# Patient Record
Sex: Female | Born: 1995 | Race: White | Hispanic: No | Marital: Married | Smoking: Former smoker
Health system: Southern US, Community
[De-identification: ages and names within clinical notes are randomized; demographics above are authoritative.]

## PROBLEM LIST (undated history)

## (undated) DIAGNOSIS — F32A Depression, unspecified: Secondary | ICD-10-CM

## (undated) DIAGNOSIS — D649 Anemia, unspecified: Secondary | ICD-10-CM

## (undated) DIAGNOSIS — F419 Anxiety disorder, unspecified: Secondary | ICD-10-CM

## (undated) HISTORY — DX: Anemia, unspecified: D64.9

## (undated) HISTORY — PX: NO PAST SURGERIES: SHX2092

## (undated) HISTORY — DX: Anxiety disorder, unspecified: F41.9

## (undated) HISTORY — DX: Depression, unspecified: F32.A

---

## 2020-06-24 ENCOUNTER — Telehealth: Payer: Self-pay

## 2020-06-24 NOTE — Telephone Encounter (Signed)
Pt called the asking what is safe to take for nausea and vomiting. Pt states she is not able to keep anything on her stomach. Pt asks is it safe to take Emetrol. Pt made aware that Emetrol is safe and she can also take Vitamin B6 100 mg and Unisom. Understanding was voiced. Jacquel Redditt l Nakhi Choi, CMA

## 2020-06-30 ENCOUNTER — Other Ambulatory Visit: Payer: Self-pay

## 2020-06-30 ENCOUNTER — Ambulatory Visit (INDEPENDENT_AMBULATORY_CARE_PROVIDER_SITE_OTHER): Payer: Medicaid Other | Admitting: Advanced Practice Midwife

## 2020-06-30 ENCOUNTER — Encounter: Payer: Self-pay | Admitting: Advanced Practice Midwife

## 2020-06-30 ENCOUNTER — Other Ambulatory Visit (HOSPITAL_COMMUNITY)
Admission: RE | Admit: 2020-06-30 | Discharge: 2020-06-30 | Disposition: A | Discharge status: Home / Self Care | Payer: Medicaid Other | Source: Ambulatory Visit | Attending: Advanced Practice Midwife | Admitting: Advanced Practice Midwife

## 2020-06-30 DIAGNOSIS — O99340 Other mental disorders complicating pregnancy, unspecified trimester: Secondary | ICD-10-CM

## 2020-06-30 DIAGNOSIS — F419 Anxiety disorder, unspecified: Secondary | ICD-10-CM | POA: Insufficient documentation

## 2020-06-30 DIAGNOSIS — Z87891 Personal history of nicotine dependence: Secondary | ICD-10-CM

## 2020-06-30 DIAGNOSIS — Z3A09 9 weeks gestation of pregnancy: Secondary | ICD-10-CM

## 2020-06-30 DIAGNOSIS — Z3480 Encounter for supervision of other normal pregnancy, unspecified trimester: Secondary | ICD-10-CM | POA: Diagnosis present

## 2020-06-30 DIAGNOSIS — F17210 Nicotine dependence, cigarettes, uncomplicated: Secondary | ICD-10-CM | POA: Insufficient documentation

## 2020-06-30 DIAGNOSIS — O99331 Smoking (tobacco) complicating pregnancy, first trimester: Secondary | ICD-10-CM

## 2020-06-30 DIAGNOSIS — O99341 Other mental disorders complicating pregnancy, first trimester: Secondary | ICD-10-CM

## 2020-06-30 HISTORY — DX: Personal history of nicotine dependence: Z87.891

## 2020-06-30 MED ORDER — HYDROXYZINE PAMOATE 25 MG PO CAPS
25.0000 mg | ORAL_CAPSULE | Freq: Three times a day (TID) | ORAL | 0 refills | Status: DC | PRN
Start: 2020-06-30 — End: 2021-02-24

## 2020-06-30 MED ORDER — ONDANSETRON HCL 4 MG PO TABS: 4.0000 mg | ORAL_TABLET | Freq: Three times a day (TID) | ORAL | 0 refills | Status: DC | PRN

## 2020-06-30 NOTE — Patient Instructions (Signed)
First Trimester of Pregnancy  The first trimester of pregnancy is from week 1 until the end of week 13 (months 1 through 3). During this time, your baby will begin to develop inside you. At 6-8 weeks, the eyes and face are formed, and the heartbeat can be seen on ultrasound. At the end of 12 weeks, all the baby's organs are formed. Prenatal care is all the medical care you receive before the birth of your baby. Make sure you get good prenatal care and follow all of your doctor's instructions. Follow these instructions at home: Medicines  Take over-the-counter and prescription medicines only as told by your doctor. Some medicines are safe and some medicines are not safe during pregnancy.  Take a prenatal vitamin that contains at least 600 micrograms (mcg) of folic acid.  If you have trouble pooping (constipation), take medicine that will make your stool soft (stool softener) if your doctor approves. Eating and drinking   Eat regular, healthy meals.  Your doctor will tell you the amount of weight gain that is right for you.  Avoid raw meat and uncooked cheese.  If you feel sick to your stomach (nauseous) or throw up (vomit): ? Eat 4 or 5 small meals a day instead of 3 large meals. ? Try eating a few soda crackers. ? Drink liquids between meals instead of during meals.  To prevent constipation: ? Eat foods that are high in fiber, like fresh fruits and vegetables, whole grains, and beans. ? Drink enough fluids to keep your pee (urine) clear or pale yellow. Activity  Exercise only as told by your doctor. Stop exercising if you have cramps or pain in your lower belly (abdomen) or low back.  Do not exercise if it is too hot, too humid, or if you are in a place of great height (high altitude).  Try to avoid standing for [place] periods of time. Move your legs often if you must stand in one place for a [place] time.  Avoid heavy lifting.  Wear low-heeled shoes. Sit and stand up  straight.  You can have sex unless your doctor tells you not to. Relieving pain and discomfort  Wear a good support bra if your breasts are sore.  Take warm water baths (sitz baths) to soothe pain or discomfort caused by hemorrhoids. Use hemorrhoid cream if your doctor says it is okay.  Rest with your legs raised if you have leg cramps or low back pain.  If you have puffy, bulging veins (varicose veins) in your legs: ? Wear support hose or compression stockings as told by your doctor. ? Raise (elevate) your feet for 15 minutes, 3-4 times a day. ? Limit salt in your food. Prenatal care  Schedule your prenatal visits by the twelfth week of pregnancy.  Write down your questions. Take them to your prenatal visits.  Keep all your prenatal visits as told by your doctor. This is important. Safety  Wear your seat belt at all times when driving.  Make a list of emergency phone numbers. The list should include numbers for family, friends, the hospital, and police and fire departments. General instructions  Ask your doctor for a referral to a local prenatal class. Begin classes no later than at the start of month 6 of your pregnancy.  Ask for help if you need counseling or if you need help with nutrition. Your doctor can give you advice or tell you where to go for help.  Do not use hot tubs, steam   rooms, or saunas.  Do not douche or use tampons or scented sanitary pads.  Do not cross your legs for [place] periods of time.  Avoid all herbs and alcohol. Avoid drugs that are not approved by your doctor.  Do not use any tobacco products, including cigarettes, chewing tobacco, and electronic cigarettes. If you need help quitting, ask your doctor. You may get counseling or other support to help you quit.  Avoid cat litter boxes and soil used by cats. These carry germs that can cause birth defects in the baby and can cause a loss of your baby (miscarriage) or stillbirth.  Visit your dentist.  At home, brush your teeth with a soft toothbrush. Be gentle when you floss. Contact a doctor if:  You are dizzy.  You have mild cramps or pressure in your lower belly.  You have a nagging pain in your belly area.  You continue to feel sick to your stomach, you throw up, or you have watery poop (diarrhea).  You have a bad smelling fluid coming from your vagina.  You have pain when you pee (urinate).  You have increased puffiness (swelling) in your face, hands, legs, or ankles. Get help right away if:  You have a fever.  You are leaking fluid from your vagina.  You have spotting or bleeding from your vagina.  You have very bad belly cramping or pain.  You gain or lose weight rapidly.  You throw up blood. It may look like coffee grounds.  You are around people who have German measles, fifth disease, or chickenpox.  You have a very bad headache.  You have shortness of breath.  You have any kind of trauma, such as from a fall or a car accident. Summary  The first trimester of pregnancy is from week 1 until the end of week 13 (months 1 through 3).  To take care of yourself and your unborn baby, you will need to eat healthy meals, take medicines only if your doctor tells you to do so, and do activities that are safe for you and your baby.  Keep all follow-up visits as told by your doctor. This is important as your doctor will have to ensure that your baby is healthy and growing well. This information is not intended to replace advice given to you by your health care provider. Make sure you discuss any questions you have with your health care provider. Document Revised: 04/04/2019 Document Reviewed: 12/20/2016 Elsevier Patient Education  2020 Elsevier Inc.  

## 2020-06-30 NOTE — Progress Notes (Signed)
Subjective:    Shannon Ruiz is a G2P0010 [redacted]w[redacted]d being seen today for her first obstetrical visit.  Her obstetrical history is significant for Anxiety disorder, hx Depression, Smoker. Patient does intend to breast feed. Pregnancy history fully reviewed.  Patient reports no complaints.except occasional nausea.  Has tired Zofran with good result. Risks reviewed, wants Rx for rare use PRN.  Wants med to help with anxiety (prefers something simple, declines SSRIs, etc)   Vitals:   06/30/20 0959 06/30/20 1002  BP: 109/67   Pulse: (!) 101   Weight: 196 lb 6.4 oz (89.1 kg)   Height:  5\' 3"  (1.6 m)    HISTORY: OB History  Gravida Para Term Preterm AB Living  2       1    SAB TAB Ectopic Multiple Live Births  1            # Outcome Date GA Lbr Len/2nd Weight Sex Delivery Anes PTL Lv  2 Current           1 SAB 2014           Past Medical History:  Diagnosis Date  . Anxiety   . Depression    History reviewed. No pertinent surgical history. Family History  Problem Relation Age of Onset  . Anxiety disorder Mother   . Hypertension Mother   . Heart Problems Mother   . Diabetes Father      Exam    Uterus:     Pelvic Exam:    Perineum: Normal Perineum   Vulva: Bartholin's, Urethra, Skene's normal   Vagina:  normal mucosa, normal discharge   pH: Pap done   Cervix: no cervical motion tenderness   Adnexa: no mass, fullness, tenderness   Bony Pelvis: gynecoid  System: Breast:  normal appearance, no masses or tenderness   Skin: normal coloration and turgor, no rashes    Neurologic: oriented, grossly non-focal   Extremities: normal strength, tone, and muscle mass   HEENT neck supple with midline trachea   Mouth/Teeth mucous membranes moist, pharynx normal without lesions   Neck supple and no masses   Cardiovascular: regular rate and rhythm   Respiratory:  appears well, vitals normal, no respiratory distress, acyanotic, normal RR, ear and throat exam is normal, neck free of mass  or lymphadenopathy, chest clear, no wheezing, crepitations, rhonchi, normal symmetric air entry   Abdomen: soft, non-tender; bowel sounds normal; no masses,  no organomegaly   Urinary: urethral meatus normal      Assessment:    Pregnancy: G2P0010 Patient Active Problem List   Diagnosis Date Noted  . Supervision of other normal pregnancy, antepartum 06/30/2020  . Cigarette smoker 06/30/2020  . Anxiety disorder affecting pregnancy, antepartum 06/30/2020        Plan:    Rx Vistaril for anxiety, may cut in half Rx Zofran for nausea, occasional use. Reviewed risks.  Initial labs drawn. Prenatal vitamins.   Follow up in 4 weeks. 50% of 30 min visit spent on counseling and coordination of care.   Discussed and offered genetic screening options, including Quad screen/AFP, NIPS testing, and option to decline testing. Benefits/risks/alternatives reviewed. Pt aware that anatomy 08/31/2020 is form of genetic screening with lower accuracy in detecting trisomies than blood work.  Pt chooses genetic screening today. NIPS: ordered. Ultrasound discussed; fetal anatomic survey: ordered. Problem list reviewed and updated. The nature of [facility] with multiple MDs and other Advanced Practice Providers was explained to patient;  also emphasized that residents, students are part of our team. Routine obstetric precautions reviewed.    Wynelle Bourgeois 06/30/2020

## 2020-07-01 LAB — CYTOLOGY - PAP: Diagnosis: NEGATIVE

## 2020-07-02 ENCOUNTER — Telehealth: Payer: Self-pay

## 2020-07-02 LAB — CULTURE, OB URINE

## 2020-07-02 LAB — URINE CULTURE, OB REFLEX

## 2020-07-02 NOTE — Telephone Encounter (Signed)
Called patient to discuss most recent pap results. No answer or voice mail to leave a message. Patient pap shown some BV and she will need to be treated for this.

## 2020-07-07 ENCOUNTER — Ambulatory Visit (INDEPENDENT_AMBULATORY_CARE_PROVIDER_SITE_OTHER): Payer: Medicaid Other

## 2020-07-07 ENCOUNTER — Other Ambulatory Visit: Payer: Self-pay

## 2020-07-07 DIAGNOSIS — Z3A10 10 weeks gestation of pregnancy: Secondary | ICD-10-CM

## 2020-07-07 DIAGNOSIS — Z3481 Encounter for supervision of other normal pregnancy, first trimester: Secondary | ICD-10-CM

## 2020-07-07 DIAGNOSIS — Z3480 Encounter for supervision of other normal pregnancy, unspecified trimester: Secondary | ICD-10-CM

## 2020-07-07 LAB — CBC/D/PLT+RPR+RH+ABO+RUB AB...
Antibody Screen: NEGATIVE
Basophils Absolute: 0.1 10*3/uL (ref 0.0–0.2)
Basos: 1 %
EOS (ABSOLUTE): 0.3 10*3/uL (ref 0.0–0.4)
Eos: 2 %
HCV Ab: <0.1 s/co ratio (ref 0.0–0.9)
HIV Screen 4th Generation wRfx: NONREACTIVE
Hematocrit: 37.2 % (ref 34.0–46.6)
Hemoglobin: 12.1 g/dL (ref 11.1–15.9)
Hepatitis B Surface Ag: NEGATIVE
Immature Grans (Abs): 0.1 10*3/uL (ref 0.0–0.1)
Immature Granulocytes: 1 %
Lymphocytes Absolute: 3.1 10*3/uL (ref 0.7–3.1)
Lymphs: 19 %
MCH: 28.7 pg (ref 26.6–33.0)
MCHC: 32.5 g/dL (ref 31.5–35.7)
MCV: 88 fL (ref 79–97)
Monocytes Absolute: 1.5 10*3/uL — ABNORMAL HIGH (ref 0.1–0.9)
Monocytes: 9 %
Neutrophils Absolute: 11.6 10*3/uL — ABNORMAL HIGH (ref 1.4–7.0)
Neutrophils: 68 %
Platelets: 595 10*3/uL — ABNORMAL HIGH (ref 150–450)
RBC: 4.21 x10E6/uL (ref 3.77–5.28)
RDW: 13.1 % (ref 11.7–15.4)
RPR Ser Ql: NONREACTIVE
Rh Factor: POSITIVE
Rubella Antibodies, IGG: <0.9 index — ABNORMAL LOW (ref >0.99)
WBC: 16.7 10*3/uL — ABNORMAL HIGH (ref 3.4–10.8)

## 2020-07-07 LAB — SMN1 COPY NUMBER ANALYSIS (SMA CARRIER SCREENING)

## 2020-07-07 LAB — HEMOGLOBIN A1C
Est. average glucose Bld gHb Est-mCnc: 105 mg/dL
Hgb A1c MFr Bld: 5.3 % (ref 4.8–5.6)

## 2020-07-07 LAB — CYSTIC FIBROSIS GENE TEST

## 2020-07-07 LAB — HCV INTERPRETATION

## 2020-07-07 NOTE — Progress Notes (Signed)
Patient presents for scan for viability due to not being able to have it done at last weeks visit.   DATING AND VIABILITY SONOGRAM   Shannon Ruiz is a 24 y.o. year old G2P0010 with LMP Patient's last menstrual period was 04/24/2020 (exact date). which would correlate to  [redacted]w[redacted]d weeks gestation.  She has regular menstrual cycles.   She is here today for a confirmatory initial sonogram.    GESTATION: SINGLETON     FETAL ACTIVITY:          Heart rate         171 bpm          The fetus is active.     GESTATIONAL AGE AND  BIOMETRICS:  Gestational criteria: Estimated Date of Delivery: 01/29/21 by LMP now at [redacted]w[redacted]d  Previous Scans:0      CROWN RUMP LENGTH           3.12 cm         10.0 weeks                                                                               AVERAGE EGA(BY THIS SCAN):  10.0 weeks  WORKING EDD( LMP ):  01-29-2021     TECHNICIAN COMMENTS: Patient informed that the ultrasound is considered a limited obstetric ultrasound and is not intended to be a complete ultrasound exam. Patient also informed that the ultrasound is not being completed with the intent of assessing for fetal or placental anomalies or any pelvic abnormalities. Explained that the purpose of today's ultrasound is to assess for fetal heart rate. Patient acknowledges the purpose of the exam and the limitations of the study.      [name] 07/07/2020 3:13 PM

## 2020-07-16 ENCOUNTER — Telehealth: Payer: Medicaid Other | Admitting: Nurse Practitioner

## 2020-07-16 DIAGNOSIS — N3000 Acute cystitis without hematuria: Secondary | ICD-10-CM | POA: Diagnosis not present

## 2020-07-16 MED ORDER — NITROFURANTOIN MONOHYD MACRO 100 MG PO CAPS
100.0000 mg | ORAL_CAPSULE | Freq: Two times a day (BID) | ORAL | 0 refills | Status: DC
Start: 2020-07-16 — End: 2020-12-03

## 2020-07-16 NOTE — Progress Notes (Signed)

## 2020-07-28 ENCOUNTER — Ambulatory Visit (INDEPENDENT_AMBULATORY_CARE_PROVIDER_SITE_OTHER): Payer: Medicaid Other | Admitting: Advanced Practice Midwife

## 2020-07-28 ENCOUNTER — Other Ambulatory Visit: Payer: Self-pay

## 2020-07-28 VITALS — BP 104/69 | HR 98 | Wt 197.0 lb

## 2020-07-28 DIAGNOSIS — Z3480 Encounter for supervision of other normal pregnancy, unspecified trimester: Secondary | ICD-10-CM

## 2020-07-28 NOTE — Progress Notes (Signed)
   PRENATAL VISIT NOTE  Subjective:  Shannon Ruiz is a 24 y.o. G2P0010 at [redacted]w[redacted]d being seen today for ongoing prenatal care.  She is currently monitored for the following issues for this low-risk pregnancy and has Supervision of other normal pregnancy, antepartum; Cigarette smoker; and Anxiety disorder affecting pregnancy, antepartum on their problem list.  Patient reports no complaints.  Contractions: Not present. Vag. Bleeding: None.  Movement: Absent. Denies leaking of fluid.   The following portions of the patient's history were reviewed and updated as appropriate: allergies, current medications, past family history, past medical history, past social history, past surgical history and problem list.   Objective:   Vitals:   07/28/20 0955  BP: 104/69  Pulse: 98  Weight: 197 lb (89.4 kg)    Fetal Status:     Movement: Absent     General:  Alert, oriented and cooperative. Patient is in no acute distress.  Skin: Skin is warm and dry. No rash noted.   Cardiovascular: Normal heart rate noted  Respiratory: Normal respiratory effort, no problems with respiration noted  Abdomen: Soft, gravid, appropriate for gestational age.  Pain/Pressure: Present     Pelvic: Cervical exam deferred        Extremities: Normal range of motion.  Edema: None  Mental Status: Normal mood and affect. Normal behavior. Normal judgment and thought content.   Assessment and Plan:  Pregnancy: G2P0010 at [redacted]w[redacted]d 1. Supervision of other normal pregnancy, antepartum       - Genetic Screening  Preterm labor symptoms and general obstetric precautions including but not limited to vaginal bleeding, contractions, leaking of fluid and fetal movement were reviewed in detail with the patient. Please refer to After Visit Summary for other counseling recommendations.     Future Appointments  Date Time Provider Department Center  08/27/2020 10:00 AM Levie Heritage, DO [facility] None  09/04/2020  9:45 AM [facility] US4 [facility]  [facility]    Wynelle Bourgeois, CNM

## 2020-08-03 MED ORDER — ONDANSETRON HCL 4 MG PO TABS: 4.0000 mg | ORAL_TABLET | Freq: Three times a day (TID) | ORAL | 3 refills | Status: DC | PRN

## 2020-08-27 ENCOUNTER — Other Ambulatory Visit: Payer: Self-pay

## 2020-08-27 ENCOUNTER — Ambulatory Visit (INDEPENDENT_AMBULATORY_CARE_PROVIDER_SITE_OTHER): Payer: Medicaid Other | Admitting: Family Medicine

## 2020-08-27 VITALS — BP 121/72 | HR 104 | Wt 197.0 lb

## 2020-08-27 DIAGNOSIS — Z3480 Encounter for supervision of other normal pregnancy, unspecified trimester: Secondary | ICD-10-CM

## 2020-08-27 DIAGNOSIS — F17210 Nicotine dependence, cigarettes, uncomplicated: Secondary | ICD-10-CM

## 2020-08-27 DIAGNOSIS — F419 Anxiety disorder, unspecified: Secondary | ICD-10-CM

## 2020-08-27 DIAGNOSIS — O99340 Other mental disorders complicating pregnancy, unspecified trimester: Secondary | ICD-10-CM

## 2020-08-27 NOTE — Progress Notes (Signed)
Pt. Presents for ROB, [redacted]w[redacted]d. Denies any questions or concerns at this time.

## 2020-08-27 NOTE — Progress Notes (Signed)
   PRENATAL VISIT NOTE  Subjective:  Shannon Ruiz is a 24 y.o. G2P0010 at [redacted]w[redacted]d being seen today for ongoing prenatal care.  She is currently monitored for the following issues for this low-risk pregnancy and has Supervision of other normal pregnancy, antepartum; Cigarette smoker; and Anxiety disorder affecting pregnancy, antepartum on their problem list.  Patient reports no complaints.  Contractions: Not present. Vag. Bleeding: None.  Movement: Present. Denies leaking of fluid.   The following portions of the patient's history were reviewed and updated as appropriate: allergies, current medications, past family history, past medical history, past social history, past surgical history and problem list.   Objective:   Vitals:   08/27/20 1002  BP: 121/72  Pulse: (!) 104  Weight: 197 lb (89.4 kg)    Fetal Status: Fetal Heart Rate (bpm): 140   Movement: Present     General:  Alert, oriented and cooperative. Patient is in no acute distress.  Skin: Skin is warm and dry. No rash noted.   Cardiovascular: Normal heart rate noted  Respiratory: Normal respiratory effort, no problems with respiration noted  Abdomen: Soft, gravid, appropriate for gestational age.  Pain/Pressure: Absent     Pelvic: Cervical exam deferred        Extremities: Normal range of motion.  Edema: None  Mental Status: Normal mood and affect. Normal behavior. Normal judgment and thought content.   Assessment and Plan:  Pregnancy: G2P0010 at [redacted]w[redacted]d 1. Supervision of other normal pregnancy, antepartum FHT and FH normal  2. Cigarette smoker  3. Anxiety disorder affecting pregnancy, antepartum   Preterm labor symptoms and general obstetric precautions including but not limited to vaginal bleeding, contractions, leaking of fluid and fetal movement were reviewed in detail with the patient. Please refer to After Visit Summary for other counseling recommendations.   Return in about 4 weeks (around 09/24/2020) for OB f/u, In  Office.  Future Appointments  Date Time Provider Department Center  09/04/2020  9:45 AM [facility] US4 [facility]    Levie Heritage, DO

## 2020-09-04 ENCOUNTER — Other Ambulatory Visit: Payer: Self-pay

## 2020-09-04 ENCOUNTER — Other Ambulatory Visit: Payer: Self-pay | Admitting: *Deleted

## 2020-09-04 ENCOUNTER — Ambulatory Visit: Payer: Medicaid Other | Attending: Advanced Practice Midwife

## 2020-09-04 ENCOUNTER — Other Ambulatory Visit: Payer: Self-pay | Admitting: Advanced Practice Midwife

## 2020-09-04 DIAGNOSIS — O99212 Obesity complicating pregnancy, second trimester: Secondary | ICD-10-CM | POA: Diagnosis not present

## 2020-09-04 DIAGNOSIS — O99332 Smoking (tobacco) complicating pregnancy, second trimester: Secondary | ICD-10-CM

## 2020-09-04 DIAGNOSIS — E669 Obesity, unspecified: Secondary | ICD-10-CM

## 2020-09-04 DIAGNOSIS — F17200 Nicotine dependence, unspecified, uncomplicated: Secondary | ICD-10-CM

## 2020-09-04 DIAGNOSIS — Z3A19 19 weeks gestation of pregnancy: Secondary | ICD-10-CM

## 2020-09-04 DIAGNOSIS — Z362 Encounter for other antenatal screening follow-up: Secondary | ICD-10-CM

## 2020-09-04 DIAGNOSIS — Z3480 Encounter for supervision of other normal pregnancy, unspecified trimester: Secondary | ICD-10-CM | POA: Diagnosis present

## 2020-09-24 ENCOUNTER — Ambulatory Visit (INDEPENDENT_AMBULATORY_CARE_PROVIDER_SITE_OTHER): Payer: Medicaid Other | Admitting: Family Medicine

## 2020-09-24 ENCOUNTER — Other Ambulatory Visit: Payer: Self-pay

## 2020-09-24 VITALS — BP 102/66 | HR 113 | Wt 196.0 lb

## 2020-09-24 DIAGNOSIS — O99340 Other mental disorders complicating pregnancy, unspecified trimester: Secondary | ICD-10-CM

## 2020-09-24 DIAGNOSIS — Z3480 Encounter for supervision of other normal pregnancy, unspecified trimester: Secondary | ICD-10-CM

## 2020-09-24 DIAGNOSIS — O210 Mild hyperemesis gravidarum: Secondary | ICD-10-CM

## 2020-09-24 DIAGNOSIS — F419 Anxiety disorder, unspecified: Secondary | ICD-10-CM

## 2020-09-24 MED ORDER — NATACHEW 28-1 MG PO CHEW: 1.0000 | CHEWABLE_TABLET | Freq: Every day | ORAL | 12 refills | Status: DC

## 2020-09-24 NOTE — Progress Notes (Signed)
   PRENATAL VISIT NOTE  Subjective:  Shannon Ruiz is a 24 y.o. G2P0010 at [redacted]w[redacted]d being seen today for ongoing prenatal care.  She is currently monitored for the following issues for this low-risk pregnancy and has Supervision of other normal pregnancy, antepartum; Cigarette smoker; and Anxiety disorder affecting pregnancy, antepartum on their problem list.  Patient reports continued nausea and vomiting. Has foods that she is able to eat and keep down. Takes Zofran 3-4 times a week..  Contractions: Not present. Vag. Bleeding: None.  Movement: Present. Denies leaking of fluid.   The following portions of the patient's history were reviewed and updated as appropriate: allergies, current medications, past family history, past medical history, past social history, past surgical history and problem list.   Objective:   Vitals:   09/24/20 0953  BP: 102/66  Pulse: (!) 113  Weight: 196 lb (88.9 kg)    Fetal Status: Fetal Heart Rate (bpm): 148 Fundal Height: 21 cm Movement: Present     General:  Alert, oriented and cooperative. Patient is in no acute distress.  Skin: Skin is warm and dry. No rash noted.   Cardiovascular: Normal heart rate noted  Respiratory: Normal respiratory effort, no problems with respiration noted  Abdomen: Soft, gravid, appropriate for gestational age.  Pain/Pressure: Absent     Pelvic: Cervical exam deferred        Extremities: Normal range of motion.  Edema: None  Mental Status: Normal mood and affect. Normal behavior. Normal judgment and thought content.   Assessment and Plan:  Pregnancy: G2P0010 at [redacted]w[redacted]d 1. Supervision of other normal pregnancy, antepartum FHT and FH normal. [place] incomplete, but normal.  2. Anxiety disorder affecting pregnancy, antepartum  3. Hyperemesis gravidarum Continue Zofran PRN.  Preterm labor symptoms and general obstetric precautions including but not limited to vaginal bleeding, contractions, leaking of fluid and fetal movement were  reviewed in detail with the patient. Please refer to After Visit Summary for other counseling recommendations.   Return in about 4 weeks (around 10/22/2020) for 2 hr GTT.  Future Appointments  Date Time Provider Department Center  10/07/2020  2:00 PM [facility] US1 [facility]  10/22/2020  8:45 AM Levie Heritage, DO [facility] None    Levie Heritage, DO

## 2020-10-07 ENCOUNTER — Ambulatory Visit: Payer: Medicaid Other | Attending: Obstetrics and Gynecology

## 2020-10-07 ENCOUNTER — Other Ambulatory Visit: Payer: Self-pay

## 2020-10-07 DIAGNOSIS — E669 Obesity, unspecified: Secondary | ICD-10-CM

## 2020-10-07 DIAGNOSIS — Z720 Tobacco use: Secondary | ICD-10-CM

## 2020-10-07 DIAGNOSIS — O99332 Smoking (tobacco) complicating pregnancy, second trimester: Secondary | ICD-10-CM

## 2020-10-07 DIAGNOSIS — O365920 Maternal care for other known or suspected poor fetal growth, second trimester, not applicable or unspecified: Secondary | ICD-10-CM

## 2020-10-07 DIAGNOSIS — Z3A23 23 weeks gestation of pregnancy: Secondary | ICD-10-CM

## 2020-10-07 DIAGNOSIS — O99212 Obesity complicating pregnancy, second trimester: Secondary | ICD-10-CM | POA: Diagnosis not present

## 2020-10-07 DIAGNOSIS — Z362 Encounter for other antenatal screening follow-up: Secondary | ICD-10-CM | POA: Diagnosis present

## 2020-10-07 DIAGNOSIS — O322XX0 Maternal care for transverse and oblique lie, not applicable or unspecified: Secondary | ICD-10-CM | POA: Diagnosis not present

## 2020-10-08 ENCOUNTER — Other Ambulatory Visit: Payer: Self-pay | Admitting: *Deleted

## 2020-10-08 DIAGNOSIS — O365990 Maternal care for other known or suspected poor fetal growth, unspecified trimester, not applicable or unspecified: Secondary | ICD-10-CM

## 2020-10-13 NOTE — Telephone Encounter (Signed)
Called patient - having intermittent headaches. Feels a little congested and possibly has allergies. No headache currently. Will have her take claritin for a few days. Has appt on Thursday.

## 2020-10-20 ENCOUNTER — Encounter: Payer: Self-pay | Admitting: Maternal & Fetal Medicine

## 2020-10-21 ENCOUNTER — Other Ambulatory Visit: Payer: Self-pay | Admitting: *Deleted

## 2020-10-21 ENCOUNTER — Ambulatory Visit: Payer: Medicaid Other | Attending: Obstetrics and Gynecology

## 2020-10-21 ENCOUNTER — Ambulatory Visit: Payer: Medicaid Other | Admitting: *Deleted

## 2020-10-21 ENCOUNTER — Other Ambulatory Visit: Payer: Self-pay

## 2020-10-21 ENCOUNTER — Encounter: Payer: Self-pay | Admitting: *Deleted

## 2020-10-21 DIAGNOSIS — F17210 Nicotine dependence, cigarettes, uncomplicated: Secondary | ICD-10-CM | POA: Insufficient documentation

## 2020-10-21 DIAGNOSIS — O365920 Maternal care for other known or suspected poor fetal growth, second trimester, not applicable or unspecified: Secondary | ICD-10-CM

## 2020-10-21 DIAGNOSIS — O99212 Obesity complicating pregnancy, second trimester: Secondary | ICD-10-CM

## 2020-10-21 DIAGNOSIS — O99332 Smoking (tobacco) complicating pregnancy, second trimester: Secondary | ICD-10-CM

## 2020-10-21 DIAGNOSIS — Z3480 Encounter for supervision of other normal pregnancy, unspecified trimester: Secondary | ICD-10-CM

## 2020-10-21 DIAGNOSIS — O99340 Other mental disorders complicating pregnancy, unspecified trimester: Secondary | ICD-10-CM | POA: Insufficient documentation

## 2020-10-21 DIAGNOSIS — E669 Obesity, unspecified: Secondary | ICD-10-CM

## 2020-10-21 DIAGNOSIS — O365990 Maternal care for other known or suspected poor fetal growth, unspecified trimester, not applicable or unspecified: Secondary | ICD-10-CM | POA: Diagnosis present

## 2020-10-21 DIAGNOSIS — O43192 Other malformation of placenta, second trimester: Secondary | ICD-10-CM

## 2020-10-21 DIAGNOSIS — F17200 Nicotine dependence, unspecified, uncomplicated: Secondary | ICD-10-CM

## 2020-10-21 DIAGNOSIS — F419 Anxiety disorder, unspecified: Secondary | ICD-10-CM | POA: Insufficient documentation

## 2020-10-21 DIAGNOSIS — Z3A25 25 weeks gestation of pregnancy: Secondary | ICD-10-CM

## 2020-10-21 DIAGNOSIS — O358XX0 Maternal care for other (suspected) fetal abnormality and damage, not applicable or unspecified: Secondary | ICD-10-CM | POA: Diagnosis not present

## 2020-10-21 DIAGNOSIS — Z362 Encounter for other antenatal screening follow-up: Secondary | ICD-10-CM

## 2020-10-21 NOTE — Procedures (Signed)
Shannon Ruiz 1996-10-22 [redacted]w[redacted]d  Fetus A Non-Stress Test Interpretation for 10/21/20  Indication: IUGR  Fetal Heart Rate A Mode: External Baseline Rate (A): 135 bpm Variability: Moderate Accelerations: 10 x 10 Decelerations: None Multiple birth?: No  Uterine Activity Mode: Palpation, Toco Contraction Frequency (min): none noted Resting Tone Palpated: Relaxed Resting Time: Adequate  Interpretation (Fetal Testing) Nonstress Test Interpretation: Reactive Comments: Reviewed tracing with Dr. Judeth Cornfield

## 2020-10-22 ENCOUNTER — Encounter: Payer: Medicaid Other | Admitting: Family Medicine

## 2020-10-28 ENCOUNTER — Ambulatory Visit: Payer: Medicaid Other | Admitting: *Deleted

## 2020-10-28 ENCOUNTER — Other Ambulatory Visit: Payer: Self-pay | Admitting: Maternal & Fetal Medicine

## 2020-10-28 ENCOUNTER — Encounter: Payer: Self-pay | Admitting: *Deleted

## 2020-10-28 ENCOUNTER — Other Ambulatory Visit: Payer: Self-pay

## 2020-10-28 ENCOUNTER — Ambulatory Visit: Payer: Medicaid Other | Attending: Obstetrics and Gynecology

## 2020-10-28 DIAGNOSIS — O365920 Maternal care for other known or suspected poor fetal growth, second trimester, not applicable or unspecified: Secondary | ICD-10-CM | POA: Diagnosis not present

## 2020-10-28 DIAGNOSIS — O43192 Other malformation of placenta, second trimester: Secondary | ICD-10-CM | POA: Diagnosis not present

## 2020-10-28 DIAGNOSIS — Z3A26 26 weeks gestation of pregnancy: Secondary | ICD-10-CM

## 2020-10-28 DIAGNOSIS — O99212 Obesity complicating pregnancy, second trimester: Secondary | ICD-10-CM

## 2020-10-28 DIAGNOSIS — Z3480 Encounter for supervision of other normal pregnancy, unspecified trimester: Secondary | ICD-10-CM | POA: Insufficient documentation

## 2020-10-28 DIAGNOSIS — F17210 Nicotine dependence, cigarettes, uncomplicated: Secondary | ICD-10-CM | POA: Diagnosis present

## 2020-10-28 DIAGNOSIS — O99210 Obesity complicating pregnancy, unspecified trimester: Secondary | ICD-10-CM | POA: Insufficient documentation

## 2020-10-28 DIAGNOSIS — O99332 Smoking (tobacco) complicating pregnancy, second trimester: Secondary | ICD-10-CM

## 2020-10-28 DIAGNOSIS — E669 Obesity, unspecified: Secondary | ICD-10-CM

## 2020-10-28 DIAGNOSIS — F419 Anxiety disorder, unspecified: Secondary | ICD-10-CM

## 2020-10-28 DIAGNOSIS — O99340 Other mental disorders complicating pregnancy, unspecified trimester: Secondary | ICD-10-CM | POA: Insufficient documentation

## 2020-10-28 DIAGNOSIS — O365990 Maternal care for other known or suspected poor fetal growth, unspecified trimester, not applicable or unspecified: Secondary | ICD-10-CM

## 2020-10-28 DIAGNOSIS — O358XX0 Maternal care for other (suspected) fetal abnormality and damage, not applicable or unspecified: Secondary | ICD-10-CM

## 2020-10-28 DIAGNOSIS — Z362 Encounter for other antenatal screening follow-up: Secondary | ICD-10-CM

## 2020-10-28 DIAGNOSIS — F17200 Nicotine dependence, unspecified, uncomplicated: Secondary | ICD-10-CM

## 2020-10-28 NOTE — Procedures (Signed)
Shannon Ruiz May 03, 1996 [redacted]w[redacted]d  Fetus A Non-Stress Test Interpretation for 10/28/20  Indication: Smoker, increased BMI  Fetal Heart Rate A Mode: External Baseline Rate (A): 135 bpm Variability: Moderate Accelerations: 10 x 10 Decelerations: None Multiple birth?: No  Uterine Activity Mode: Palpation, Toco Contraction Frequency (min): none noted Resting Tone Palpated: Relaxed Resting Time: Adequate  Interpretation (Fetal Testing) Nonstress Test Interpretation: Reactive Comments: Reviewed tracing with Dr. Judeth Cornfield

## 2020-10-30 ENCOUNTER — Ambulatory Visit (INDEPENDENT_AMBULATORY_CARE_PROVIDER_SITE_OTHER): Payer: Medicaid Other | Admitting: Family Medicine

## 2020-10-30 ENCOUNTER — Other Ambulatory Visit: Payer: Self-pay

## 2020-10-30 VITALS — BP 124/70 | HR 89 | Wt 207.0 lb

## 2020-10-30 DIAGNOSIS — O365990 Maternal care for other known or suspected poor fetal growth, unspecified trimester, not applicable or unspecified: Secondary | ICD-10-CM

## 2020-10-30 DIAGNOSIS — O99340 Other mental disorders complicating pregnancy, unspecified trimester: Secondary | ICD-10-CM

## 2020-10-30 DIAGNOSIS — Z3A27 27 weeks gestation of pregnancy: Secondary | ICD-10-CM

## 2020-10-30 DIAGNOSIS — F419 Anxiety disorder, unspecified: Secondary | ICD-10-CM

## 2020-10-30 DIAGNOSIS — O358XX0 Maternal care for other (suspected) fetal abnormality and damage, not applicable or unspecified: Secondary | ICD-10-CM | POA: Insufficient documentation

## 2020-10-30 DIAGNOSIS — Z23 Encounter for immunization: Secondary | ICD-10-CM | POA: Diagnosis not present

## 2020-10-30 DIAGNOSIS — O35BXX0 Maternal care for other (suspected) fetal abnormality and damage, fetal cardiac anomalies, not applicable or unspecified: Secondary | ICD-10-CM

## 2020-10-30 DIAGNOSIS — F17210 Nicotine dependence, cigarettes, uncomplicated: Secondary | ICD-10-CM

## 2020-10-30 DIAGNOSIS — Z3480 Encounter for supervision of other normal pregnancy, unspecified trimester: Secondary | ICD-10-CM

## 2020-10-30 NOTE — Progress Notes (Signed)
   PRENATAL VISIT NOTE  Subjective:  Shannon Ruiz is a 24 y.o. G2P0010 at [redacted]w[redacted]d being seen today for ongoing prenatal care.  She is currently monitored for the following issues for this high-risk pregnancy and has Supervision of other normal pregnancy, antepartum; Cigarette smoker; Anxiety disorder affecting pregnancy, antepartum; and Ventricular septal defect (VSD) of fetus affecting management of pregnancy on their problem list.  Patient reports no complaints.  Contractions: Not present. Vag. Bleeding: None.  Movement: Present. Denies leaking of fluid.   The following portions of the patient's history were reviewed and updated as appropriate: allergies, current medications, past family history, past medical history, past social history, past surgical history and problem list.   Objective:   Vitals:   10/30/20 0903  BP: 124/70  Pulse: 89  Weight: 207 lb (93.9 kg)    Fetal Status: Fetal Heart Rate (bpm): 135 Fundal Height: 26 cm Movement: Present     General:  Alert, oriented and cooperative. Patient is in no acute distress.  Skin: Skin is warm and dry. No rash noted.   Cardiovascular: Normal heart rate noted  Respiratory: Normal respiratory effort, no problems with respiration noted  Abdomen: Soft, gravid, appropriate for gestational age.  Pain/Pressure: Present     Pelvic: Cervical exam deferred        Extremities: Normal range of motion.  Edema: None  Mental Status: Normal mood and affect. Normal behavior. Normal judgment and thought content.   Assessment and Plan:  Pregnancy: G2P0010 at [redacted]w[redacted]d 1. Supervision of other normal pregnancy, antepartum FHT and FH normal - Glucose Tolerance, 2 Hours w/1 Hour - CBC - HIV antibody (with reflex) - RPR  2. [redacted] weeks gestation of pregnancy - Glucose Tolerance, 2 Hours w/1 Hour - CBC - HIV antibody (with reflex) - RPR  3. Cigarette smoker  4. Anxiety disorder affecting pregnancy, antepartum  5. Ventricular septal defect (VSD) of  fetus affecting management of pregnancy Has f/u echo with peds cardiology  6. FGR Improved.  Preterm labor symptoms and general obstetric precautions including but not limited to vaginal bleeding, contractions, leaking of fluid and fetal movement were reviewed in detail with the patient. Please refer to After Visit Summary for other counseling recommendations.   Return in about 4 weeks (around 11/27/2020) for OB f/u, In Office.  Future Appointments  Date Time Provider Department Center  11/04/2020  7:15 AM [facility] NURSE [facility]  11/04/2020  7:30 AM [facility] US2 [facility]  11/04/2020  9:45 AM [facility] NST [facility]  11/11/2020  7:15 AM [facility] NURSE [facility]  11/11/2020  7:30 AM [facility] US3 [facility]  11/11/2020  8:45 AM [facility] NST [facility]  11/18/2020  7:15 AM [facility] NURSE [facility]  11/18/2020  7:30 AM [facility] US3 [facility]  11/18/2020  8:45 AM [facility] NST [facility]    Levie Heritage, DO

## 2020-10-31 LAB — GLUCOSE TOLERANCE, 2 HOURS W/ 1HR
Glucose, 1 hour: 103 mg/dL (ref 65–179)
Glucose, 2 hour: 107 mg/dL (ref 65–152)
Glucose, Fasting: 79 mg/dL (ref 65–91)

## 2020-10-31 LAB — CBC
Hematocrit: 30 % — ABNORMAL LOW (ref 34.0–46.6)
Hemoglobin: 10.3 g/dL — ABNORMAL LOW (ref 11.1–15.9)
MCH: 30.6 pg (ref 26.6–33.0)
MCHC: 34.3 g/dL (ref 31.5–35.7)
MCV: 89 fL (ref 79–97)
Platelets: 439 10*3/uL (ref 150–450)
RBC: 3.37 x10E6/uL — ABNORMAL LOW (ref 3.77–5.28)
RDW: 13.1 % (ref 11.7–15.4)
WBC: 18.8 10*3/uL — ABNORMAL HIGH (ref 3.4–10.8)

## 2020-10-31 LAB — HIV ANTIBODY (ROUTINE TESTING W REFLEX): HIV Screen 4th Generation wRfx: NONREACTIVE

## 2020-10-31 LAB — RPR: RPR Ser Ql: NONREACTIVE

## 2020-11-02 MED ORDER — FERROUS GLUCONATE 324 (38 FE) MG PO TABS: 324.0000 mg | ORAL_TABLET | Freq: Every day | ORAL | 3 refills | Status: DC

## 2020-11-02 NOTE — Addendum Note (Signed)
Addended by: Levie Heritage on: 11/02/2020 08:45 AM   Modules accepted: Orders

## 2020-11-04 ENCOUNTER — Encounter: Payer: Self-pay | Admitting: *Deleted

## 2020-11-04 ENCOUNTER — Ambulatory Visit: Payer: Medicaid Other | Admitting: *Deleted

## 2020-11-04 ENCOUNTER — Other Ambulatory Visit: Payer: Self-pay | Admitting: Maternal & Fetal Medicine

## 2020-11-04 ENCOUNTER — Encounter (HOSPITAL_COMMUNITY): Payer: Self-pay | Admitting: Family Medicine

## 2020-11-04 ENCOUNTER — Inpatient Hospital Stay (HOSPITAL_COMMUNITY)
Admission: AD | Admit: 2020-11-04 | Discharge: 2020-11-05 | Disposition: A | Discharge status: Home / Self Care | Payer: Medicaid Other | Attending: Family Medicine | Admitting: Family Medicine

## 2020-11-04 ENCOUNTER — Ambulatory Visit: Payer: Medicaid Other | Attending: Obstetrics and Gynecology

## 2020-11-04 ENCOUNTER — Other Ambulatory Visit: Payer: Self-pay

## 2020-11-04 DIAGNOSIS — O99332 Smoking (tobacco) complicating pregnancy, second trimester: Secondary | ICD-10-CM

## 2020-11-04 DIAGNOSIS — W19XXXA Unspecified fall, initial encounter: Secondary | ICD-10-CM

## 2020-11-04 DIAGNOSIS — O99891 Other specified diseases and conditions complicating pregnancy: Secondary | ICD-10-CM

## 2020-11-04 DIAGNOSIS — O358XX0 Maternal care for other (suspected) fetal abnormality and damage, not applicable or unspecified: Secondary | ICD-10-CM

## 2020-11-04 DIAGNOSIS — F17210 Nicotine dependence, cigarettes, uncomplicated: Secondary | ICD-10-CM | POA: Insufficient documentation

## 2020-11-04 DIAGNOSIS — Z3480 Encounter for supervision of other normal pregnancy, unspecified trimester: Secondary | ICD-10-CM

## 2020-11-04 DIAGNOSIS — Z79899 Other long term (current) drug therapy: Secondary | ICD-10-CM | POA: Insufficient documentation

## 2020-11-04 DIAGNOSIS — O43192 Other malformation of placenta, second trimester: Secondary | ICD-10-CM | POA: Diagnosis not present

## 2020-11-04 DIAGNOSIS — O99340 Other mental disorders complicating pregnancy, unspecified trimester: Secondary | ICD-10-CM

## 2020-11-04 DIAGNOSIS — O365990 Maternal care for other known or suspected poor fetal growth, unspecified trimester, not applicable or unspecified: Secondary | ICD-10-CM | POA: Diagnosis not present

## 2020-11-04 DIAGNOSIS — W109XXA Fall (on) (from) unspecified stairs and steps, initial encounter: Secondary | ICD-10-CM | POA: Insufficient documentation

## 2020-11-04 DIAGNOSIS — Z3A27 27 weeks gestation of pregnancy: Secondary | ICD-10-CM

## 2020-11-04 DIAGNOSIS — O321XX0 Maternal care for breech presentation, not applicable or unspecified: Secondary | ICD-10-CM

## 2020-11-04 DIAGNOSIS — F419 Anxiety disorder, unspecified: Secondary | ICD-10-CM

## 2020-11-04 DIAGNOSIS — O365920 Maternal care for other known or suspected poor fetal growth, second trimester, not applicable or unspecified: Secondary | ICD-10-CM | POA: Diagnosis not present

## 2020-11-04 DIAGNOSIS — O99342 Other mental disorders complicating pregnancy, second trimester: Secondary | ICD-10-CM

## 2020-11-04 DIAGNOSIS — F17200 Nicotine dependence, unspecified, uncomplicated: Secondary | ICD-10-CM

## 2020-11-04 DIAGNOSIS — Y9301 Activity, walking, marching and hiking: Secondary | ICD-10-CM | POA: Insufficient documentation

## 2020-11-04 DIAGNOSIS — O99212 Obesity complicating pregnancy, second trimester: Secondary | ICD-10-CM

## 2020-11-04 DIAGNOSIS — O9A212 Injury, poisoning and certain other consequences of external causes complicating pregnancy, second trimester: Secondary | ICD-10-CM | POA: Insufficient documentation

## 2020-11-04 DIAGNOSIS — Z720 Tobacco use: Secondary | ICD-10-CM

## 2020-11-04 DIAGNOSIS — Z362 Encounter for other antenatal screening follow-up: Secondary | ICD-10-CM

## 2020-11-04 DIAGNOSIS — T1490XA Injury, unspecified, initial encounter: Secondary | ICD-10-CM | POA: Insufficient documentation

## 2020-11-04 NOTE — Procedures (Signed)
Shannon Ruiz 25-Sep-1996 [redacted]w[redacted]d  Fetus A Non-Stress Test Interpretation for 11/04/20  Indication: IUGR  Fetal Heart Rate A Mode: External Baseline Rate (A): 125 bpm Variability: Moderate Accelerations: 10 x 10 Decelerations: None Multiple birth?: No  Uterine Activity Mode: Palpation, Toco Contraction Frequency (min): none noted Resting Tone Palpated: Relaxed Resting Time: Adequate  Interpretation (Fetal Testing) Nonstress Test Interpretation: Reactive Overall Impression: Reassuring for gestational age Comments: Reviewed tracing with Dr. Judeth Cornfield

## 2020-11-04 NOTE — MAU Provider Note (Signed)
Chief Complaint:  Fall   First Provider Initiated Contact with Patient 11/04/20 2152     HPI: Shannon Ruiz is a 24 y.o. G2P0010 at 52w5dwho presents to maternity admissions reporting falling down several steps and landing on the right side of her lower abdomen.  Does feel movement and has no pain except some scrapes on leg. . She reports good fetal movement, denies LOF, vaginal bleeding, vaginal itching/burning, urinary symptoms, h/a, dizziness, n/v, diarrhea, constipation or fever/chills.    Fall The fall occurred while walking. She fell from a height of 3 to 5 ft. There was no blood loss. The point of impact was the buttocks (right abdomen). The patient is experiencing no pain. Pertinent negatives include no abdominal pain, fever, headaches, numbness or tingling. She has tried nothing for the symptoms.     RN Note: I missed a step coming out of my house and fell down about 3 steps and hit right side of my stomach on the corner of the step. Denies VB or LOF since fall. Have felt FM since fall.   Past Medical History: Past Medical History:  Diagnosis Date  . Anxiety   . Depression     Past obstetric history: OB History  Gravida Para Term Preterm AB Living  2       1    SAB TAB Ectopic Multiple Live Births  1            # Outcome Date GA Lbr Len/2nd Weight Sex Delivery Anes PTL Lv  2 Current           1 SAB 2014            Past Surgical History: Past Surgical History:  Procedure Laterality Date  . NO PAST SURGERIES      Family History: Family History  Problem Relation Age of Onset  . Anxiety disorder Mother   . Hypertension Mother   . Heart Problems Mother   . Diabetes Father     Social History: Social History   Tobacco Use  . Smoking status: Smoker, Current Status Unknown    Types: Cigarettes  . Smokeless tobacco: Never Used  Vaping Use  . Vaping Use: Never used  Substance Use Topics  . Alcohol use: Never  . Drug use: Never    Allergies: No Known  Allergies  Meds:  Medications Prior to Admission  Medication Sig Dispense Refill Last Dose  . ferrous gluconate (FERGON) 324 MG tablet Take 1 tablet (324 mg total) by mouth daily with breakfast. 90 tablet 3   . hydrOXYzine (VISTARIL) 25 MG capsule Take 1 capsule (25 mg total) by mouth 3 (three) times daily as needed for anxiety. 30 capsule 0   . nitrofurantoin, macrocrystal-monohydrate, (MACROBID) 100 MG capsule Take 1 capsule (100 mg total) by mouth 2 (two) times daily. 1 po BId (Patient not taking: Reported on 10/30/2020) 14 capsule 0   . ondansetron (ZOFRAN) 4 MG tablet Take 1 tablet (4 mg total) by mouth every 8 (eight) hours as needed for nausea or vomiting. 42 tablet 3   . Prenatal Vit-Fe Fum-Fe Bisg-FA (NATACHEW) 28-1 MG CHEW Chew 1 tablet by mouth daily. 30 tablet 12     I have reviewed patient's Past Medical Hx, Surgical Hx, Family Hx, Social Hx, medications and allergies.   ROS:  Review of Systems  Constitutional: Negative for fever.  Gastrointestinal: Negative for abdominal pain.  Neurological: Negative for tingling, numbness and headaches.   Other systems negative  Physical Exam  Patient Vitals for the past 24 hrs:  BP Temp Pulse Resp  11/04/20 2147 138/83 -- (!) 101 --  11/04/20 2145 -- 98.3 F (36.8 C) -- 18   Constitutional: Well-developed, well-nourished female in no acute distress.  Cardiovascular: normal rate and rhythm Respiratory: normal effort, clear to auscultation bilaterally GI: Abd soft, non-tender, gravid appropriate for gestational age.   No rebound or guarding. MS: Extremities nontender, no edema, normal ROM Neurologic: Alert and oriented x 4.  GU: Neg CVAT.  PELVIC EXAM: deferred  FHT:  Baseline 135 , moderate variability, accelerations present, no decelerations Contractions: Uterine irritability   Labs: No results found for this or any previous visit (from the past 24 hour(s)). A/Positive/-- (07/06 1127)  Imaging:  Limited OB US: no sign  of abruption, anterior placenta, Marginal cord insertionnormal fluid MAU Course/MDM: EFM for 4 hours.  Fetal heart rate was reactive throughout, excellent for gestational age There was uterine irritability but no contractions Abdomen soft and nontender, good fetal movement.  Consult Dr Shawnie Pons with presentation, exam findings and test results.  Treatments in MAU included EFM.   Patient had questions about her IUGR and prospects for early delivery (discussed by Dr Judeth Cornfield in MFM).  DIscussed concerns and recommend she direct further questions to Dr Judeth Cornfield as needed.  She was mostly worried about baby's chance for survival if delivered this early. Reviewed prognosis improves with every week of gestation and reviewed excellent neonatology care available here.    Assessment: 1. Supervision of other normal pregnancy, antepartum   2. Cigarette smoker   3. Anxiety disorder affecting pregnancy, antepartum     Plan: Discharge home Reviewed importance of monitoring for pain and fetal movement Preterm Labor precautions and fetal kick counts Follow up in Office for prenatal visits   Encouraged to return here if she develops worsening of symptoms, increase in pain, fever, or other concerning symptoms.   Pt stable at time of discharge.  Wynelle Bourgeois CNM, MSN Certified Nurse-Midwife 11/04/2020 9:52 PM

## 2020-11-04 NOTE — MAU Note (Addendum)
I missed a step coming out of my house and fell down about 3 steps and hit right side of my stomach on the corner of the step. This happened about 2030. Denies VB or LOF since fall. Have felt FM since fall.

## 2020-11-05 ENCOUNTER — Inpatient Hospital Stay (HOSPITAL_BASED_OUTPATIENT_CLINIC_OR_DEPARTMENT_OTHER): Payer: Medicaid Other

## 2020-11-05 DIAGNOSIS — O365920 Maternal care for other known or suspected poor fetal growth, second trimester, not applicable or unspecified: Secondary | ICD-10-CM | POA: Diagnosis not present

## 2020-11-05 DIAGNOSIS — O99342 Other mental disorders complicating pregnancy, second trimester: Secondary | ICD-10-CM | POA: Diagnosis not present

## 2020-11-05 DIAGNOSIS — W19XXXA Unspecified fall, initial encounter: Secondary | ICD-10-CM | POA: Diagnosis not present

## 2020-11-05 DIAGNOSIS — Z3A27 27 weeks gestation of pregnancy: Secondary | ICD-10-CM | POA: Diagnosis not present

## 2020-11-05 DIAGNOSIS — O9A212 Injury, poisoning and certain other consequences of external causes complicating pregnancy, second trimester: Secondary | ICD-10-CM | POA: Diagnosis not present

## 2020-11-05 DIAGNOSIS — W109XXA Fall (on) (from) unspecified stairs and steps, initial encounter: Secondary | ICD-10-CM | POA: Diagnosis not present

## 2020-11-05 DIAGNOSIS — O99891 Other specified diseases and conditions complicating pregnancy: Secondary | ICD-10-CM | POA: Diagnosis not present

## 2020-11-05 DIAGNOSIS — F17210 Nicotine dependence, cigarettes, uncomplicated: Secondary | ICD-10-CM | POA: Diagnosis not present

## 2020-11-05 DIAGNOSIS — T1490XA Injury, unspecified, initial encounter: Secondary | ICD-10-CM | POA: Diagnosis not present

## 2020-11-05 DIAGNOSIS — Y9301 Activity, walking, marching and hiking: Secondary | ICD-10-CM | POA: Diagnosis not present

## 2020-11-05 DIAGNOSIS — O99332 Smoking (tobacco) complicating pregnancy, second trimester: Secondary | ICD-10-CM | POA: Diagnosis not present

## 2020-11-05 DIAGNOSIS — Z79899 Other long term (current) drug therapy: Secondary | ICD-10-CM | POA: Diagnosis not present

## 2020-11-05 DIAGNOSIS — F419 Anxiety disorder, unspecified: Secondary | ICD-10-CM | POA: Diagnosis not present

## 2020-11-05 NOTE — Progress Notes (Signed)
To us

## 2020-11-05 NOTE — Discharge Instructions (Signed)
Third Trimester of Pregnancy The third trimester is from week 28 through week 40 (months 7 through 9). The third trimester is a time when the unborn baby (fetus) is growing rapidly. At the end of the ninth month, the fetus is about 20 inches in length and weighs 6-10 pounds. Body changes during your third trimester Your body will continue to go through many changes during pregnancy. The changes vary from woman to woman. During the third trimester:  Your weight will continue to increase. You can expect to gain 25-35 pounds (11-16 kg) by the end of the pregnancy.  You may begin to get stretch marks on your hips, abdomen, and breasts.  You may urinate more often because the fetus is moving lower into your pelvis and pressing on your bladder.  You may develop or continue to have heartburn. This is caused by increased hormones that slow down muscles in the digestive tract.  You may develop or continue to have constipation because increased hormones slow digestion and cause the muscles that push waste through your intestines to relax.  You may develop hemorrhoids. These are swollen veins (varicose veins) in the rectum that can itch or be painful.  You may develop swollen, bulging veins (varicose veins) in your legs.  You may have increased body aches in the pelvis, back, or thighs. This is due to weight gain and increased hormones that are relaxing your joints.  You may have changes in your hair. These can include thickening of your hair, rapid growth, and changes in texture. Some women also have hair loss during or after pregnancy, or hair that feels dry or thin. Your hair will most likely return to normal after your baby is born.  Your breasts will continue to grow and they will continue to become tender. A yellow fluid (colostrum) may leak from your breasts. This is the first milk you are producing for your baby.  Your belly button may stick out.  You may notice more swelling in your hands,  face, or ankles.  You may have increased tingling or numbness in your hands, arms, and legs. The skin on your belly may also feel numb.  You may feel short of breath because of your expanding uterus.  You may have more problems sleeping. This can be caused by the size of your belly, increased need to urinate, and an increase in your body's metabolism.  You may notice the fetus "dropping," or moving lower in your abdomen (lightening).  You may have increased vaginal discharge.  You may notice your joints feel loose and you may have pain around your pelvic bone. What to expect at prenatal visits You will have prenatal exams every 2 weeks until week 36. Then you will have weekly prenatal exams. During a routine prenatal visit:  You will be weighed to make sure you and the baby are growing normally.  Your blood pressure will be taken.  Your abdomen will be measured to track your baby's growth.  The fetal heartbeat will be listened to.  Any test results from the previous visit will be discussed.  You may have a cervical check near your due date to see if your cervix has softened or thinned (effaced).  You will be tested for Group B streptococcus. This happens between 35 and 37 weeks. Your health care provider may ask you:  What your birth plan is.  How you are feeling.  If you are feeling the baby move.  If you have had any abnormal   symptoms, such as leaking fluid, bleeding, severe headaches, or abdominal cramping.  If you are using any tobacco products, including cigarettes, chewing tobacco, and electronic cigarettes.  If you have any questions. Other tests or screenings that may be performed during your third trimester include:  Blood tests that check for low iron levels (anemia).  Fetal testing to check the health, activity level, and growth of the fetus. Testing is done if you have certain medical conditions or if there are problems during the pregnancy.  Nonstress test  (NST). This test checks the health of your baby to make sure there are no signs of problems, such as the baby not getting enough oxygen. During this test, a belt is placed around your belly. The baby is made to move, and its heart rate is monitored during movement. What is false labor? False labor is a condition in which you feel small, irregular tightenings of the muscles in the womb (contractions) that usually go away with rest, changing position, or drinking water. These are called Braxton Hicks contractions. Contractions may last for hours, days, or even weeks before true labor sets in. If contractions come at regular intervals, become more frequent, increase in intensity, or become painful, you should see your health care provider. What are the signs of labor?  Abdominal cramps.  Regular contractions that start at 10 minutes apart and become stronger and more frequent with time.  Contractions that start on the top of the uterus and spread down to the lower abdomen and back.  Increased pelvic pressure and dull back pain.  A watery or bloody mucus discharge that comes from the vagina.  Leaking of amniotic fluid. This is also known as your "water breaking." It could be a slow trickle or a gush. Let your health care provider know if it has a color or strange odor. If you have any of these signs, call your health care provider right away, even if it is before your due date. Follow these instructions at home: Medicines  Follow your health care provider's instructions regarding medicine use. Specific medicines may be either safe or unsafe to take during pregnancy.  Take a prenatal vitamin that contains at least 600 micrograms (mcg) of folic acid.  If you develop constipation, try taking a stool softener if your health care provider approves. Eating and drinking   Eat a balanced diet that includes fresh fruits and vegetables, whole grains, good sources of protein such as meat, eggs, or tofu,  and low-fat dairy. Your health care provider will help you determine the amount of weight gain that is right for you.  Avoid raw meat and uncooked cheese. These carry germs that can cause birth defects in the baby.  If you have low calcium intake from food, talk to your health care provider about whether you should take a daily calcium supplement.  Eat four or five small meals rather than three large meals a day.  Limit foods that are high in fat and processed sugars, such as fried and sweet foods.  To prevent constipation: ? Drink enough fluid to keep your urine clear or pale yellow. ? Eat foods that are high in fiber, such as fresh fruits and vegetables, whole grains, and beans. Activity  Exercise only as directed by your health care provider. Most women can continue their usual exercise routine during pregnancy. Try to exercise for 30 minutes at least 5 days a week. Stop exercising if you experience uterine contractions.  Avoid heavy lifting.  Do   not exercise in extreme heat or humidity, or at high altitudes.  Wear low-heel, comfortable shoes.  Practice good posture.  You may continue to have sex unless your health care provider tells you otherwise. Relieving pain and discomfort  Take frequent breaks and rest with your legs elevated if you have leg cramps or low back pain.  Take warm sitz baths to soothe any pain or discomfort caused by hemorrhoids. Use hemorrhoid cream if your health care provider approves.  Wear a good support bra to prevent discomfort from breast tenderness.  If you develop varicose veins: ? Wear support pantyhose or compression stockings as told by your healthcare provider. ? Elevate your feet for 15 minutes, 3-4 times a day. Prenatal care  Write down your questions. Take them to your prenatal visits.  Keep all your prenatal visits as told by your health care provider. This is important. Safety  Wear your seat belt at all times when driving.  Make  a list of emergency phone numbers, including numbers for family, friends, the hospital, and police and fire departments. General instructions  Avoid cat litter boxes and soil used by cats. These carry germs that can cause birth defects in the baby. If you have a cat, ask someone to clean the litter box for you.  Do not travel far distances unless it is absolutely necessary and only with the approval of your health care provider.  Do not use hot tubs, steam rooms, or saunas.  Do not drink alcohol.  Do not use any products that contain nicotine or tobacco, such as cigarettes and e-cigarettes. If you need help quitting, ask your health care provider.  Do not use any medicinal herbs or unprescribed drugs. These chemicals affect the formation and growth of the baby.  Do not douche or use tampons or scented sanitary pads.  Do not cross your legs for [place] periods of time.  To prepare for the arrival of your baby: ? Take prenatal classes to understand, practice, and ask questions about labor and delivery. ? Make a trial run to the hospital. ? Visit the hospital and tour the maternity area. ? Arrange for maternity or paternity leave through employers. ? Arrange for family and friends to take care of pets while you are in the hospital. ? Purchase a rear-facing car seat and make sure you know how to install it in your car. ? Pack your hospital bag. ? Prepare the baby's nursery. Make sure to remove all pillows and stuffed animals from the baby's crib to prevent suffocation.  Visit your dentist if you have not gone during your pregnancy. Use a soft toothbrush to brush your teeth and be gentle when you floss. Contact a health care provider if:  You are unsure if you are in labor or if your water has broken.  You become dizzy.  You have mild pelvic cramps, pelvic pressure, or nagging pain in your abdominal area.  You have lower back pain.  You have persistent nausea, vomiting, or  diarrhea.  You have an unusual or bad smelling vaginal discharge.  You have pain when you urinate. Get help right away if:  Your water breaks before 37 weeks.  You have regular contractions less than 5 minutes apart before 37 weeks.  You have a fever.  You are leaking fluid from your vagina.  You have spotting or bleeding from your vagina.  You have severe abdominal pain or cramping.  You have rapid weight loss or weight gain.  You have   shortness of breath with chest pain.  You notice sudden or extreme swelling of your face, hands, ankles, feet, or legs.  Your baby makes fewer than 10 movements in 2 hours.  You have severe headaches that do not go away when you take medicine.  You have vision changes. Summary  The third trimester is from week 28 through week 40, months 7 through 9. The third trimester is a time when the unborn baby (fetus) is growing rapidly.  During the third trimester, your discomfort may increase as you and your baby continue to gain weight. You may have abdominal, leg, and back pain, sleeping problems, and an increased need to urinate.  During the third trimester your breasts will keep growing and they will continue to become tender. A yellow fluid (colostrum) may leak from your breasts. This is the first milk you are producing for your baby.  False labor is a condition in which you feel small, irregular tightenings of the muscles in the womb (contractions) that eventually go away. These are called Braxton Hicks contractions. Contractions may last for hours, days, or even weeks before true labor sets in.  Signs of labor can include: abdominal cramps; regular contractions that start at 10 minutes apart and become stronger and more frequent with time; watery or bloody mucus discharge that comes from the vagina; increased pelvic pressure and dull back pain; and leaking of amniotic fluid. This information is not intended to replace advice given to you by your  health care provider. Make sure you discuss any questions you have with your health care provider. Document Revised: 04/04/2019 Document Reviewed: 01/17/2017 Elsevier Patient Education  2020 [employer]  

## 2020-11-05 NOTE — Progress Notes (Signed)
Wynelle Bourgeois CNM in earlier to discuss d/c plan. Written and verbal d/c instructions given and understanding voiced. Work note given to significant other

## 2020-11-05 NOTE — Progress Notes (Signed)
PT had gotten up to BR and transducer adjusted when back to bed

## 2020-11-11 ENCOUNTER — Ambulatory Visit: Payer: Medicaid Other | Admitting: *Deleted

## 2020-11-11 ENCOUNTER — Encounter: Payer: Self-pay | Admitting: *Deleted

## 2020-11-11 ENCOUNTER — Ambulatory Visit: Payer: Medicaid Other | Attending: Obstetrics and Gynecology

## 2020-11-11 ENCOUNTER — Other Ambulatory Visit: Payer: Self-pay

## 2020-11-11 DIAGNOSIS — O99333 Smoking (tobacco) complicating pregnancy, third trimester: Secondary | ICD-10-CM

## 2020-11-11 DIAGNOSIS — F419 Anxiety disorder, unspecified: Secondary | ICD-10-CM | POA: Insufficient documentation

## 2020-11-11 DIAGNOSIS — O365930 Maternal care for other known or suspected poor fetal growth, third trimester, not applicable or unspecified: Secondary | ICD-10-CM

## 2020-11-11 DIAGNOSIS — F17210 Nicotine dependence, cigarettes, uncomplicated: Secondary | ICD-10-CM | POA: Diagnosis present

## 2020-11-11 DIAGNOSIS — O99340 Other mental disorders complicating pregnancy, unspecified trimester: Secondary | ICD-10-CM | POA: Insufficient documentation

## 2020-11-11 DIAGNOSIS — O358XX0 Maternal care for other (suspected) fetal abnormality and damage, not applicable or unspecified: Secondary | ICD-10-CM | POA: Diagnosis not present

## 2020-11-11 DIAGNOSIS — O43193 Other malformation of placenta, third trimester: Secondary | ICD-10-CM

## 2020-11-11 DIAGNOSIS — F17200 Nicotine dependence, unspecified, uncomplicated: Secondary | ICD-10-CM

## 2020-11-11 DIAGNOSIS — Z3A28 28 weeks gestation of pregnancy: Secondary | ICD-10-CM

## 2020-11-11 DIAGNOSIS — Z3480 Encounter for supervision of other normal pregnancy, unspecified trimester: Secondary | ICD-10-CM

## 2020-11-11 DIAGNOSIS — O365990 Maternal care for other known or suspected poor fetal growth, unspecified trimester, not applicable or unspecified: Secondary | ICD-10-CM

## 2020-11-11 DIAGNOSIS — E669 Obesity, unspecified: Secondary | ICD-10-CM

## 2020-11-11 DIAGNOSIS — O365920 Maternal care for other known or suspected poor fetal growth, second trimester, not applicable or unspecified: Secondary | ICD-10-CM | POA: Diagnosis present

## 2020-11-11 DIAGNOSIS — O99213 Obesity complicating pregnancy, third trimester: Secondary | ICD-10-CM

## 2020-11-11 NOTE — Procedures (Signed)
Shannon Ruiz 1996-04-17 [redacted]w[redacted]d  Fetus A Non-Stress Test Interpretation for 11/11/20  Indication: IUGR  Fetal Heart Rate A Mode: External Baseline Rate (A): 125 bpm Variability: Moderate Accelerations: 10 x 10 Decelerations: None Multiple birth?: No  Uterine Activity Mode: Palpation, Toco Contraction Frequency (min): none noted Resting Tone Palpated: Relaxed Resting Time: Adequate  Interpretation (Fetal Testing) Nonstress Test Interpretation: Reactive Overall Impression: Reassuring for gestational age Comments: Reviewed tracing with Dr. Grace Bushy

## 2020-11-16 ENCOUNTER — Encounter: Payer: Self-pay | Admitting: Pediatric Cardiology

## 2020-11-18 ENCOUNTER — Other Ambulatory Visit: Payer: Self-pay | Admitting: *Deleted

## 2020-11-18 ENCOUNTER — Ambulatory Visit: Payer: Medicaid Other | Attending: Obstetrics and Gynecology

## 2020-11-18 ENCOUNTER — Ambulatory Visit: Payer: Medicaid Other | Admitting: *Deleted

## 2020-11-18 ENCOUNTER — Other Ambulatory Visit: Payer: Self-pay

## 2020-11-18 ENCOUNTER — Ambulatory Visit (HOSPITAL_BASED_OUTPATIENT_CLINIC_OR_DEPARTMENT_OTHER): Payer: Medicaid Other | Admitting: *Deleted

## 2020-11-18 ENCOUNTER — Encounter: Payer: Self-pay | Admitting: *Deleted

## 2020-11-18 DIAGNOSIS — O365990 Maternal care for other known or suspected poor fetal growth, unspecified trimester, not applicable or unspecified: Secondary | ICD-10-CM | POA: Insufficient documentation

## 2020-11-18 DIAGNOSIS — F17210 Nicotine dependence, cigarettes, uncomplicated: Secondary | ICD-10-CM

## 2020-11-18 DIAGNOSIS — O99340 Other mental disorders complicating pregnancy, unspecified trimester: Secondary | ICD-10-CM

## 2020-11-18 DIAGNOSIS — O99213 Obesity complicating pregnancy, third trimester: Secondary | ICD-10-CM | POA: Diagnosis not present

## 2020-11-18 DIAGNOSIS — F17200 Nicotine dependence, unspecified, uncomplicated: Secondary | ICD-10-CM

## 2020-11-18 DIAGNOSIS — E669 Obesity, unspecified: Secondary | ICD-10-CM | POA: Diagnosis not present

## 2020-11-18 DIAGNOSIS — O365930 Maternal care for other known or suspected poor fetal growth, third trimester, not applicable or unspecified: Secondary | ICD-10-CM

## 2020-11-18 DIAGNOSIS — Z3480 Encounter for supervision of other normal pregnancy, unspecified trimester: Secondary | ICD-10-CM

## 2020-11-18 DIAGNOSIS — O358XX0 Maternal care for other (suspected) fetal abnormality and damage, not applicable or unspecified: Secondary | ICD-10-CM

## 2020-11-18 DIAGNOSIS — O365920 Maternal care for other known or suspected poor fetal growth, second trimester, not applicable or unspecified: Secondary | ICD-10-CM | POA: Diagnosis present

## 2020-11-18 DIAGNOSIS — Z3A29 29 weeks gestation of pregnancy: Secondary | ICD-10-CM

## 2020-11-18 DIAGNOSIS — F419 Anxiety disorder, unspecified: Secondary | ICD-10-CM | POA: Diagnosis present

## 2020-11-18 DIAGNOSIS — O99333 Smoking (tobacco) complicating pregnancy, third trimester: Secondary | ICD-10-CM

## 2020-11-18 NOTE — Procedures (Signed)
Shannon Ruiz [birth year] [redacted]w[redacted]d  Fetus A Non-Stress Test Interpretation for 11/18/20  Indication: IUGR  Fetal Heart Rate A Mode: External Baseline Rate (A): 130 bpm Variability: Moderate Accelerations: 15 x 15, 10 x 10 Decelerations: None Multiple birth?: No  Uterine Activity Mode: Palpation, Toco Contraction Frequency (min): none noted Contraction Quality: Mild Resting Tone Palpated: Relaxed Resting Time: Adequate  Interpretation (Fetal Testing) Nonstress Test Interpretation: Reactive Overall Impression: Reassuring for gestational age Comments: Reviewed tracing with Dr. Parke Poisson

## 2020-11-24 ENCOUNTER — Other Ambulatory Visit: Payer: Self-pay

## 2020-11-24 ENCOUNTER — Ambulatory Visit: Payer: Medicaid Other | Attending: Obstetrics and Gynecology

## 2020-11-24 ENCOUNTER — Ambulatory Visit: Payer: Medicaid Other | Admitting: *Deleted

## 2020-11-24 ENCOUNTER — Encounter: Payer: Self-pay | Admitting: *Deleted

## 2020-11-24 DIAGNOSIS — F419 Anxiety disorder, unspecified: Secondary | ICD-10-CM | POA: Diagnosis present

## 2020-11-24 DIAGNOSIS — Z720 Tobacco use: Secondary | ICD-10-CM

## 2020-11-24 DIAGNOSIS — O99333 Smoking (tobacco) complicating pregnancy, third trimester: Secondary | ICD-10-CM

## 2020-11-24 DIAGNOSIS — O365930 Maternal care for other known or suspected poor fetal growth, third trimester, not applicable or unspecified: Secondary | ICD-10-CM

## 2020-11-24 DIAGNOSIS — O358XX0 Maternal care for other (suspected) fetal abnormality and damage, not applicable or unspecified: Secondary | ICD-10-CM | POA: Diagnosis not present

## 2020-11-24 DIAGNOSIS — Z3480 Encounter for supervision of other normal pregnancy, unspecified trimester: Secondary | ICD-10-CM | POA: Insufficient documentation

## 2020-11-24 DIAGNOSIS — Z3A30 30 weeks gestation of pregnancy: Secondary | ICD-10-CM

## 2020-11-24 DIAGNOSIS — E669 Obesity, unspecified: Secondary | ICD-10-CM

## 2020-11-24 DIAGNOSIS — O99213 Obesity complicating pregnancy, third trimester: Secondary | ICD-10-CM

## 2020-11-24 DIAGNOSIS — F17210 Nicotine dependence, cigarettes, uncomplicated: Secondary | ICD-10-CM

## 2020-11-24 DIAGNOSIS — Z362 Encounter for other antenatal screening follow-up: Secondary | ICD-10-CM

## 2020-11-24 DIAGNOSIS — O43193 Other malformation of placenta, third trimester: Secondary | ICD-10-CM

## 2020-11-24 DIAGNOSIS — O99340 Other mental disorders complicating pregnancy, unspecified trimester: Secondary | ICD-10-CM | POA: Insufficient documentation

## 2020-11-24 NOTE — Procedures (Signed)
Shannon Ruiz 11/02/1996 [redacted]w[redacted]d  Fetus A Non-Stress Test Interpretation for 11/24/20  Indication: IUGR  Fetal Heart Rate A Mode: External Baseline Rate (A): 130 bpm Variability: Moderate Accelerations: 10 x 10, 15 x 15 Decelerations: None Multiple birth?: No  Uterine Activity Mode: Palpation, Toco Contraction Frequency (min): None Resting Tone Palpated: Relaxed Resting Time: Adequate  Interpretation (Fetal Testing) Nonstress Test Interpretation: Reactive Comments: Dr. Grace Bushy reviewed tracing.

## 2020-11-27 ENCOUNTER — Encounter: Payer: Medicaid Other | Admitting: Family Medicine

## 2020-12-02 ENCOUNTER — Encounter: Payer: Self-pay | Admitting: *Deleted

## 2020-12-02 ENCOUNTER — Other Ambulatory Visit: Payer: Self-pay

## 2020-12-02 ENCOUNTER — Ambulatory Visit: Payer: Medicaid Other | Admitting: *Deleted

## 2020-12-02 ENCOUNTER — Ambulatory Visit: Payer: Medicaid Other

## 2020-12-02 ENCOUNTER — Ambulatory Visit: Payer: Medicaid Other | Attending: Obstetrics and Gynecology

## 2020-12-02 DIAGNOSIS — O365930 Maternal care for other known or suspected poor fetal growth, third trimester, not applicable or unspecified: Secondary | ICD-10-CM

## 2020-12-02 DIAGNOSIS — Z3480 Encounter for supervision of other normal pregnancy, unspecified trimester: Secondary | ICD-10-CM | POA: Diagnosis present

## 2020-12-02 DIAGNOSIS — Z720 Tobacco use: Secondary | ICD-10-CM

## 2020-12-02 DIAGNOSIS — O99340 Other mental disorders complicating pregnancy, unspecified trimester: Secondary | ICD-10-CM | POA: Insufficient documentation

## 2020-12-02 DIAGNOSIS — E669 Obesity, unspecified: Secondary | ICD-10-CM

## 2020-12-02 DIAGNOSIS — F419 Anxiety disorder, unspecified: Secondary | ICD-10-CM | POA: Insufficient documentation

## 2020-12-02 DIAGNOSIS — F17210 Nicotine dependence, cigarettes, uncomplicated: Secondary | ICD-10-CM | POA: Insufficient documentation

## 2020-12-02 DIAGNOSIS — O99213 Obesity complicating pregnancy, third trimester: Secondary | ICD-10-CM

## 2020-12-02 DIAGNOSIS — O99333 Smoking (tobacco) complicating pregnancy, third trimester: Secondary | ICD-10-CM

## 2020-12-02 DIAGNOSIS — O358XX0 Maternal care for other (suspected) fetal abnormality and damage, not applicable or unspecified: Secondary | ICD-10-CM

## 2020-12-02 DIAGNOSIS — Z362 Encounter for other antenatal screening follow-up: Secondary | ICD-10-CM

## 2020-12-02 DIAGNOSIS — Z3A31 31 weeks gestation of pregnancy: Secondary | ICD-10-CM

## 2020-12-02 DIAGNOSIS — O365990 Maternal care for other known or suspected poor fetal growth, unspecified trimester, not applicable or unspecified: Secondary | ICD-10-CM

## 2020-12-02 DIAGNOSIS — O43193 Other malformation of placenta, third trimester: Secondary | ICD-10-CM

## 2020-12-02 NOTE — Progress Notes (Signed)
C/o" constant nausea, lower abd pain, and upper abd cramping x 5 days." Will see OB MD tomorrow.

## 2020-12-02 NOTE — Procedures (Signed)
Shannon Ruiz [birth year] [redacted]w[redacted]d  Fetus A Non-Stress Test Interpretation for 12/02/20  Indication: IUGR  Fetal Heart Rate A Mode: External Baseline Rate (A): 130 bpm Variability: Moderate Accelerations: 15 x 15 Decelerations: None Multiple birth?: No  Uterine Activity Mode: Palpation, Toco Contraction Frequency (min): none Resting Tone Palpated: Relaxed Resting Time: Adequate  Interpretation (Fetal Testing) Nonstress Test Interpretation: Reactive Overall Impression: Reassuring for gestational age Comments: Dr. Grace Bushy reviewd tracing

## 2020-12-03 ENCOUNTER — Ambulatory Visit (INDEPENDENT_AMBULATORY_CARE_PROVIDER_SITE_OTHER): Payer: Medicaid Other | Admitting: Family Medicine

## 2020-12-03 VITALS — BP 115/69 | HR 78 | Wt 209.0 lb

## 2020-12-03 DIAGNOSIS — O99340 Other mental disorders complicating pregnancy, unspecified trimester: Secondary | ICD-10-CM

## 2020-12-03 DIAGNOSIS — Z3480 Encounter for supervision of other normal pregnancy, unspecified trimester: Secondary | ICD-10-CM

## 2020-12-03 DIAGNOSIS — F17210 Nicotine dependence, cigarettes, uncomplicated: Secondary | ICD-10-CM

## 2020-12-03 DIAGNOSIS — Z3A31 31 weeks gestation of pregnancy: Secondary | ICD-10-CM

## 2020-12-03 DIAGNOSIS — O365990 Maternal care for other known or suspected poor fetal growth, unspecified trimester, not applicable or unspecified: Secondary | ICD-10-CM | POA: Insufficient documentation

## 2020-12-03 DIAGNOSIS — O358XX0 Maternal care for other (suspected) fetal abnormality and damage, not applicable or unspecified: Secondary | ICD-10-CM

## 2020-12-03 DIAGNOSIS — O35BXX0 Maternal care for other (suspected) fetal abnormality and damage, fetal cardiac anomalies, not applicable or unspecified: Secondary | ICD-10-CM

## 2020-12-03 DIAGNOSIS — F419 Anxiety disorder, unspecified: Secondary | ICD-10-CM

## 2020-12-03 DIAGNOSIS — D508 Other iron deficiency anemias: Secondary | ICD-10-CM

## 2020-12-03 LAB — COMPREHENSIVE METABOLIC PANEL
ALT: 5 IU/L (ref 0–32)
AST: 5 IU/L (ref 0–40)
Albumin/Globulin Ratio: 1.2 (ref 1.2–2.2)
Albumin: 3.3 g/dL — ABNORMAL LOW (ref 3.9–5.0)
Alkaline Phosphatase: 103 IU/L (ref 44–121)
BUN/Creatinine Ratio: 7 — ABNORMAL LOW (ref 9–23)
BUN: 3 mg/dL — ABNORMAL LOW (ref 6–20)
Bilirubin Total: <0.2 mg/dL (ref 0.0–1.2)
CO2: 19 mmol/L — ABNORMAL LOW (ref 20–29)
Calcium: 9.2 mg/dL (ref 8.7–10.2)
Chloride: 105 mmol/L (ref 96–106)
Creatinine, Ser: 0.43 mg/dL — ABNORMAL LOW (ref 0.57–1.00)
GFR calc Af Amer: 165 mL/min/{1.73_m2} (ref >59)
GFR calc non Af Amer: 143 mL/min/{1.73_m2} (ref >59)
Globulin, Total: 2.7 g/dL (ref 1.5–4.5)
Glucose: 72 mg/dL (ref 65–99)
Potassium: 3 mmol/L — ABNORMAL LOW (ref 3.5–5.2)
Sodium: 141 mmol/L (ref 134–144)
Total Protein: 6 g/dL (ref 6.0–8.5)

## 2020-12-03 LAB — PROTEIN / CREATININE RATIO, URINE
Creatinine, Urine: 101.3 mg/dL
Protein, Ur: 18.7 mg/dL
Protein/Creat Ratio: 185 mg/g creat (ref 0–200)

## 2020-12-03 LAB — CBC
Hematocrit: 29.6 % — ABNORMAL LOW (ref 34.0–46.6)
Hemoglobin: 9.9 g/dL — ABNORMAL LOW (ref 11.1–15.9)
MCH: 29.4 pg (ref 26.6–33.0)
MCHC: 33.4 g/dL (ref 31.5–35.7)
MCV: 88 fL (ref 79–97)
Platelets: 418 10*3/uL (ref 150–450)
RBC: 3.37 x10E6/uL — ABNORMAL LOW (ref 3.77–5.28)
RDW: 12.6 % (ref 11.7–15.4)
WBC: 19.5 10*3/uL — ABNORMAL HIGH (ref 3.4–10.8)

## 2020-12-03 MED ORDER — ONDANSETRON HCL 4 MG PO TABS: 4.0000 mg | ORAL_TABLET | Freq: Three times a day (TID) | ORAL | 3 refills | Status: DC | PRN

## 2020-12-03 NOTE — Progress Notes (Signed)
   PRENATAL VISIT NOTE  Subjective:  Shannon Ruiz is a 24 y.o. G2P0010 at [redacted]w[redacted]d being seen today for ongoing prenatal care.  She is currently monitored for the following issues for this high-risk pregnancy and has Supervision of other normal pregnancy, antepartum; Cigarette smoker; Anxiety disorder affecting pregnancy, antepartum; Ventricular septal defect (VSD) of fetus affecting management of pregnancy; and Pregnancy affected by fetal growth restriction on their problem list.  Patient reports no complaints.  Contractions: Irritability. Vag. Bleeding: None.  Movement: Present. Denies leaking of fluid.   The following portions of the patient's history were reviewed and updated as appropriate: allergies, current medications, past family history, past medical history, past social history, past surgical history and problem list.   Objective:   Vitals:   12/03/20 0934  BP: 115/69  Pulse: 78  Weight: 209 lb (94.8 kg)    Fetal Status:     Movement: Present     General:  Alert, oriented and cooperative. Patient is in no acute distress.  Skin: Skin is warm and dry. No rash noted.   Cardiovascular: Normal heart rate noted  Respiratory: Normal respiratory effort, no problems with respiration noted  Abdomen: Soft, gravid, appropriate for gestational age.  Pain/Pressure: Present     Pelvic: Cervical exam deferred        Extremities: Normal range of motion.  Edema: None  Mental Status: Normal mood and affect. Normal behavior. Normal judgment and thought content.   Assessment and Plan:  Pregnancy: G2P0010 at [redacted]w[redacted]d 1. [redacted] weeks gestation of pregnancy  2. Supervision of other normal pregnancy, antepartum FHT and FH normal. Currently breech position  3. Cigarette smoker  4. Anxiety disorder affecting pregnancy, antepartum  5. Ventricular septal defect (VSD) of fetus affecting management of pregnancy Resolved on echo 11/22.  6. Pregnancy affected by fetal growth restriction EFW 5% on [place]  11/24.  Weekly antenatal testing Elevated dopplers, but no AEDF. Continue with antenatal testing.  7. Iron deficient anemia in pregnancy Will schedule infusions as inadequate increase with oral iron.  Preterm labor symptoms and general obstetric precautions including but not limited to vaginal bleeding, contractions, leaking of fluid and fetal movement were reviewed in detail with the patient. Please refer to After Visit Summary for other counseling recommendations.   No follow-ups on file.  Future Appointments  Date Time Provider Department Center  12/08/2020  7:30 AM [facility] NURSE [facility]  12/08/2020  7:45 AM [facility] US4 [facility]  12/17/2020 10:00 AM Levie Heritage, DO [facility] None    Levie Heritage, DO

## 2020-12-07 ENCOUNTER — Other Ambulatory Visit: Payer: Self-pay

## 2020-12-07 MED ORDER — ONDANSETRON HCL 4 MG PO TABS: 4.0000 mg | ORAL_TABLET | Freq: Three times a day (TID) | ORAL | 3 refills | Status: DC | PRN

## 2020-12-08 ENCOUNTER — Other Ambulatory Visit: Payer: Self-pay

## 2020-12-08 ENCOUNTER — Ambulatory Visit: Payer: Medicaid Other | Admitting: *Deleted

## 2020-12-08 ENCOUNTER — Encounter: Payer: Self-pay | Admitting: *Deleted

## 2020-12-08 ENCOUNTER — Other Ambulatory Visit: Payer: Self-pay | Admitting: *Deleted

## 2020-12-08 ENCOUNTER — Ambulatory Visit: Payer: Medicaid Other | Attending: Obstetrics and Gynecology

## 2020-12-08 ENCOUNTER — Other Ambulatory Visit: Payer: Self-pay | Admitting: Obstetrics

## 2020-12-08 DIAGNOSIS — F419 Anxiety disorder, unspecified: Secondary | ICD-10-CM | POA: Insufficient documentation

## 2020-12-08 DIAGNOSIS — O99340 Other mental disorders complicating pregnancy, unspecified trimester: Secondary | ICD-10-CM | POA: Insufficient documentation

## 2020-12-08 DIAGNOSIS — F17210 Nicotine dependence, cigarettes, uncomplicated: Secondary | ICD-10-CM

## 2020-12-08 DIAGNOSIS — O99213 Obesity complicating pregnancy, third trimester: Secondary | ICD-10-CM | POA: Diagnosis not present

## 2020-12-08 DIAGNOSIS — O365930 Maternal care for other known or suspected poor fetal growth, third trimester, not applicable or unspecified: Secondary | ICD-10-CM | POA: Insufficient documentation

## 2020-12-08 DIAGNOSIS — F17200 Nicotine dependence, unspecified, uncomplicated: Secondary | ICD-10-CM

## 2020-12-08 DIAGNOSIS — O99333 Smoking (tobacco) complicating pregnancy, third trimester: Secondary | ICD-10-CM

## 2020-12-08 DIAGNOSIS — E669 Obesity, unspecified: Secondary | ICD-10-CM

## 2020-12-08 DIAGNOSIS — Z3A32 32 weeks gestation of pregnancy: Secondary | ICD-10-CM

## 2020-12-08 DIAGNOSIS — Z3480 Encounter for supervision of other normal pregnancy, unspecified trimester: Secondary | ICD-10-CM | POA: Diagnosis present

## 2020-12-08 DIAGNOSIS — O43193 Other malformation of placenta, third trimester: Secondary | ICD-10-CM

## 2020-12-08 DIAGNOSIS — O365990 Maternal care for other known or suspected poor fetal growth, unspecified trimester, not applicable or unspecified: Secondary | ICD-10-CM

## 2020-12-08 NOTE — Procedures (Signed)
Shannon Ruiz Sep 09, 1996 [redacted]w[redacted]d  Fetus A Non-Stress Test Interpretation for 12/08/20  Indication: IUGR  Fetal Heart Rate A Mode: External Baseline Rate (A): 130 bpm Variability: Moderate Accelerations: 15 x 15 Decelerations: None Multiple birth?: No  Uterine Activity Mode: Palpation,Toco Contraction Frequency (min): None Resting Tone Palpated: Relaxed Resting Time: Adequate  Interpretation (Fetal Testing) Nonstress Test Interpretation: Reactive Comments: Dr. Judeth Cornfield reviewed tracing.

## 2020-12-11 ENCOUNTER — Other Ambulatory Visit: Payer: Self-pay

## 2020-12-11 ENCOUNTER — Encounter (HOSPITAL_COMMUNITY)
Admission: RE | Admit: 2020-12-11 | Discharge: 2020-12-11 | Disposition: A | Discharge status: Home / Self Care | Payer: Medicaid Other | Source: Ambulatory Visit | Attending: Family Medicine | Admitting: Family Medicine

## 2020-12-11 DIAGNOSIS — O99013 Anemia complicating pregnancy, third trimester: Secondary | ICD-10-CM | POA: Diagnosis present

## 2020-12-11 DIAGNOSIS — D509 Iron deficiency anemia, unspecified: Secondary | ICD-10-CM | POA: Insufficient documentation

## 2020-12-11 DIAGNOSIS — Z3A33 33 weeks gestation of pregnancy: Secondary | ICD-10-CM | POA: Insufficient documentation

## 2020-12-11 MED ORDER — ONDANSETRON HCL 4 MG/2ML IJ SOLN
INTRAMUSCULAR | Status: AC
  Filled 2020-12-11: qty 2

## 2020-12-11 MED ORDER — ONDANSETRON HCL 4 MG/2ML IJ SOLN
4.0000 mg | INTRAMUSCULAR | Status: DC
  Administered 2020-12-11: 4 mg via INTRAVENOUS

## 2020-12-11 MED ORDER — SODIUM CHLORIDE 0.9 % IV SOLN
300.0000 mg | INTRAVENOUS | Status: DC
  Administered 2020-12-11: 300 mg via INTRAVENOUS
  Filled 2020-12-11: qty 15

## 2020-12-11 NOTE — Discharge Instructions (Signed)

## 2020-12-13 ENCOUNTER — Other Ambulatory Visit: Payer: Self-pay

## 2020-12-13 ENCOUNTER — Inpatient Hospital Stay (HOSPITAL_COMMUNITY)
Admission: AD | Admit: 2020-12-13 | Discharge: 2020-12-13 | Disposition: A | Discharge status: Home / Self Care | Payer: Medicaid Other | Attending: Obstetrics & Gynecology | Admitting: Obstetrics & Gynecology

## 2020-12-13 ENCOUNTER — Encounter (HOSPITAL_COMMUNITY): Payer: Self-pay | Admitting: Obstetrics & Gynecology

## 2020-12-13 DIAGNOSIS — R110 Nausea: Secondary | ICD-10-CM | POA: Insufficient documentation

## 2020-12-13 DIAGNOSIS — F419 Anxiety disorder, unspecified: Secondary | ICD-10-CM | POA: Diagnosis not present

## 2020-12-13 DIAGNOSIS — E876 Hypokalemia: Secondary | ICD-10-CM | POA: Diagnosis not present

## 2020-12-13 DIAGNOSIS — O139 Gestational [pregnancy-induced] hypertension without significant proteinuria, unspecified trimester: Secondary | ICD-10-CM

## 2020-12-13 DIAGNOSIS — O99283 Endocrine, nutritional and metabolic diseases complicating pregnancy, third trimester: Secondary | ICD-10-CM | POA: Diagnosis not present

## 2020-12-13 DIAGNOSIS — O368130 Decreased fetal movements, third trimester, not applicable or unspecified: Secondary | ICD-10-CM | POA: Diagnosis not present

## 2020-12-13 DIAGNOSIS — O26893 Other specified pregnancy related conditions, third trimester: Secondary | ICD-10-CM | POA: Diagnosis not present

## 2020-12-13 DIAGNOSIS — Z79899 Other long term (current) drug therapy: Secondary | ICD-10-CM | POA: Diagnosis not present

## 2020-12-13 DIAGNOSIS — Z3A33 33 weeks gestation of pregnancy: Secondary | ICD-10-CM | POA: Diagnosis not present

## 2020-12-13 DIAGNOSIS — R030 Elevated blood-pressure reading, without diagnosis of hypertension: Secondary | ICD-10-CM | POA: Diagnosis not present

## 2020-12-13 DIAGNOSIS — F17210 Nicotine dependence, cigarettes, uncomplicated: Secondary | ICD-10-CM | POA: Diagnosis not present

## 2020-12-13 DIAGNOSIS — O99343 Other mental disorders complicating pregnancy, third trimester: Secondary | ICD-10-CM | POA: Insufficient documentation

## 2020-12-13 DIAGNOSIS — Z3689 Encounter for other specified antenatal screening: Secondary | ICD-10-CM

## 2020-12-13 DIAGNOSIS — R519 Headache, unspecified: Secondary | ICD-10-CM | POA: Diagnosis not present

## 2020-12-13 DIAGNOSIS — O99333 Smoking (tobacco) complicating pregnancy, third trimester: Secondary | ICD-10-CM | POA: Insufficient documentation

## 2020-12-13 LAB — COMPREHENSIVE METABOLIC PANEL
ALT: 8 U/L (ref 0–44)
AST: 11 U/L — ABNORMAL LOW (ref 15–41)
Albumin: 2.4 g/dL — ABNORMAL LOW (ref 3.5–5.0)
Alkaline Phosphatase: 97 U/L (ref 38–126)
Anion gap: 10 (ref 5–15)
BUN: 5 mg/dL — ABNORMAL LOW (ref 6–20)
CO2: 20 mmol/L — ABNORMAL LOW (ref 22–32)
Calcium: 8.5 mg/dL — ABNORMAL LOW (ref 8.9–10.3)
Chloride: 108 mmol/L (ref 98–111)
Creatinine, Ser: 0.41 mg/dL — ABNORMAL LOW (ref 0.44–1.00)
GFR, Estimated: >60 mL/min (ref >60)
Glucose, Bld: 80 mg/dL (ref 70–99)
Potassium: 2.7 mmol/L — CL (ref 3.5–5.1)
Sodium: 138 mmol/L (ref 135–145)
Total Bilirubin: 0.2 mg/dL — ABNORMAL LOW (ref 0.3–1.2)
Total Protein: 5.6 g/dL — ABNORMAL LOW (ref 6.5–8.1)

## 2020-12-13 LAB — CBC
HCT: 28 % — ABNORMAL LOW (ref 36.0–46.0)
Hemoglobin: 9.4 g/dL — ABNORMAL LOW (ref 12.0–15.0)
MCH: 29.2 pg (ref 26.0–34.0)
MCHC: 33.6 g/dL (ref 30.0–36.0)
MCV: 87 fL (ref 80.0–100.0)
Platelets: 382 10*3/uL (ref 150–400)
RBC: 3.22 MIL/uL — ABNORMAL LOW (ref 3.87–5.11)
RDW: 13.6 % (ref 11.5–15.5)
WBC: 17.4 10*3/uL — ABNORMAL HIGH (ref 4.0–10.5)
nRBC: 0 % (ref 0.0–0.2)

## 2020-12-13 LAB — URINALYSIS, ROUTINE W REFLEX MICROSCOPIC
Bilirubin Urine: NEGATIVE
Glucose, UA: NEGATIVE mg/dL
Hgb urine dipstick: NEGATIVE
Ketones, ur: NEGATIVE mg/dL
Nitrite: NEGATIVE
Protein, ur: NEGATIVE mg/dL
Specific Gravity, Urine: 1.01 (ref 1.005–1.030)
Squamous Epithelial / HPF: >50 — ABNORMAL HIGH (ref 0–5)
pH: 7 (ref 5.0–8.0)

## 2020-12-13 LAB — PROTEIN / CREATININE RATIO, URINE
Creatinine, Urine: 88.27 mg/dL
Protein Creatinine Ratio: 0.19 mg/mg{Cre} — ABNORMAL HIGH (ref 0.00–0.15)
Total Protein, Urine: 17 mg/dL

## 2020-12-13 MED ORDER — POTASSIUM CHLORIDE CRYS ER 20 MEQ PO TBCR: 20.0000 meq | EXTENDED_RELEASE_TABLET | Freq: Two times a day (BID) | ORAL | 0 refills | Status: DC

## 2020-12-13 MED ORDER — ACETAMINOPHEN 500 MG PO TABS
1000.0000 mg | ORAL_TABLET | Freq: Once | ORAL | Status: AC
  Administered 2020-12-13: 1000 mg via ORAL
  Filled 2020-12-13: qty 2

## 2020-12-13 NOTE — Discharge Instructions (Signed)

## 2020-12-13 NOTE — MAU Note (Addendum)
CRITICAL VALUE STICKER  CRITICAL VALUE: Potassium 2.7  RECEIVER (on-site recipient of call): Manuela Neptune, RN  DATE & TIME NOTIFIED: 2126  MESSENGER (representative from lab): LAB  MD NOTIFIED: Antony Odea, CNM  TIME OF NOTIFICATION: 2137  RESPONSE:

## 2020-12-13 NOTE — MAU Provider Note (Signed)
History     CSN: [id number]  Arrival date and time: 12/13/20 1948   Event Date/Time   First Provider Initiated Contact with Patient 12/13/20 2038      Chief Complaint  Patient presents with  . Decreased Fetal Movement   HPI Shannon Ruiz is a 24 y.o. G2P0010 at [redacted]w[redacted]d who presents with decreased fetal movement. She states she had a cramp at 0100 and has not felt the baby move since. She also reports a headache that she rates a 6/10. She has not tried any medicine for the pain. She has had 2 cups of water, one sandwich and an ice cream sandwich to eat today. She states she struggles with nausea so she doesn't eat much during the day. She reports zofran helps with her nausea. She denies any leaking or bleeding. She denies any abdominal pain. She reports occasionally elevated BPs in the pregnancy.   OB History    Gravida  2   Para      Term      Preterm      AB  1   Living        SAB  1   IAB      Ectopic      Multiple      Live Births              Past Medical History:  Diagnosis Date  . Anxiety   . Depression     Past Surgical History:  Procedure Laterality Date  . NO PAST SURGERIES      Family History  Problem Relation Age of Onset  . Anxiety disorder Mother   . Hypertension Mother   . Heart Problems Mother   . Diabetes Father     Social History   Tobacco Use  . Smoking status: Smoker, Current Status Unknown    Types: Cigarettes  . Smokeless tobacco: Never Used  Vaping Use  . Vaping Use: Never used  Substance Use Topics  . Alcohol use: Never  . Drug use: Never    Allergies: No Known Allergies  Medications Prior to Admission  Medication Sig Dispense Refill Last Dose  . ondansetron (ZOFRAN) 4 MG tablet Take 1 tablet (4 mg total) by mouth every 8 (eight) hours as needed for nausea or vomiting. 42 tablet 3 Past Week at Unknown time  . Prenatal Vit-Fe Fum-Fe Bisg-FA (NATACHEW) 28-1 MG CHEW Chew 1 tablet by mouth daily. 30 tablet 12  12/12/2020 at Unknown time  . ferrous gluconate (FERGON) 324 MG tablet Take 1 tablet (324 mg total) by mouth daily with breakfast. 90 tablet 3   . hydrOXYzine (VISTARIL) 25 MG capsule Take 1 capsule (25 mg total) by mouth 3 (three) times daily as needed for anxiety. 30 capsule 0 More than a month at Unknown time    Review of Systems  Constitutional: Negative.  Negative for fatigue and fever.  HENT: Negative.   Respiratory: Negative.  Negative for shortness of breath.   Cardiovascular: Negative.  Negative for chest pain.  Gastrointestinal: Negative.  Negative for abdominal pain, constipation, diarrhea, nausea and vomiting.  Genitourinary: Negative.  Negative for dysuria.  Neurological: Positive for headaches. Negative for dizziness.   Physical Exam   Blood pressure 138/89, pulse 93, temperature 98.7 F (37.1 C), resp. rate 15, height 5\' 3"  (1.6 m), weight 95.3 kg, last menstrual period 04/24/2020, SpO2 100 %.  Patient Vitals for the past 24 hrs:  BP Temp Pulse Resp SpO2 Height Weight  12/13/20 2201  133/88 -- 89 -- -- -- --  12/13/20 2134 132/81 -- 91 -- -- -- --  12/13/20 2116 (!) 150/74 -- 95 -- -- -- --  12/13/20 2100 132/83 -- 95 -- -- -- --  12/13/20 2013 138/89 98.7 F (37.1 C) 93 15 100 % 5\' 3"  (1.6 m) 95.3 kg   Physical Exam Vitals and nursing note reviewed.  Constitutional:      General: She is not in acute distress.    Appearance: She is well-developed and well-nourished.  HENT:     Head: Normocephalic.  Eyes:     Pupils: Pupils are equal, round, and reactive to light.  Cardiovascular:     Rate and Rhythm: Normal rate and regular rhythm.     Heart sounds: Normal heart sounds.  Pulmonary:     Effort: Pulmonary effort is normal. No respiratory distress.     Breath sounds: Normal breath sounds.  Abdominal:     General: Bowel sounds are normal. There is no distension.     Palpations: Abdomen is soft.     Tenderness: There is no abdominal tenderness.  Skin:     General: Skin is warm and dry.  Neurological:     Mental Status: She is alert and oriented to person, place, and time.  Psychiatric:        Mood and Affect: Mood and affect normal.        Behavior: Behavior normal.        Thought Content: Thought content normal.        Judgment: Judgment normal.    Fetal Tracing:  Baseline: 120 Variability: moderate Accels: 15x15 Decels: none   Toco: none   MAU Course  Procedures Results for orders placed or performed during the hospital encounter of 12/13/20 (from the past 24 hour(s))  CBC     Status: Abnormal   Collection Time: 12/13/20  8:52 PM  Result Value Ref Range   WBC 17.4 (H) 4.0 - 10.5 K/uL   RBC 3.22 (L) 3.87 - 5.11 MIL/uL   Hemoglobin 9.4 (L) 12.0 - 15.0 g/dL   HCT 12/15/20 (L) 63.0 - 16.0 %   MCV 87.0 80.0 - 100.0 fL   MCH 29.2 26.0 - 34.0 pg   MCHC 33.6 30.0 - 36.0 g/dL   RDW 10.9 32.3 - 55.7 %   Platelets 382 150 - 400 K/uL   nRBC 0.0 0.0 - 0.2 %  Comprehensive metabolic panel     Status: Abnormal   Collection Time: 12/13/20  8:52 PM  Result Value Ref Range   Sodium 138 135 - 145 mmol/L   Potassium 2.7 (LL) 3.5 - 5.1 mmol/L   Chloride 108 98 - 111 mmol/L   CO2 20 (L) 22 - 32 mmol/L   Glucose, Bld 80 70 - 99 mg/dL   BUN 5 (L) 6 - 20 mg/dL   Creatinine, Ser 12/15/20 (L) 0.44 - 1.00 mg/dL   Calcium 8.5 (L) 8.9 - 10.3 mg/dL   Total Protein 5.6 (L) 6.5 - 8.1 g/dL   Albumin 2.4 (L) 3.5 - 5.0 g/dL   AST 11 (L) 15 - 41 U/L   ALT 8 0 - 44 U/L   Alkaline Phosphatase 97 38 - 126 U/L   Total Bilirubin 0.2 (L) 0.3 - 1.2 mg/dL   GFR, Estimated 0.25 >42 mL/min   Anion gap 10 5 - 15  Urinalysis, Routine w reflex microscopic     Status: Abnormal   Collection Time: 12/13/20  9:17 PM  Result  Value Ref Range   Color, Urine YELLOW YELLOW   APPearance CLOUDY (A) CLEAR   Specific Gravity, Urine 1.010 1.005 - 1.030   pH 7.0 5.0 - 8.0   Glucose, UA NEGATIVE NEGATIVE mg/dL   Hgb urine dipstick NEGATIVE NEGATIVE   Bilirubin Urine NEGATIVE  NEGATIVE   Ketones, ur NEGATIVE NEGATIVE mg/dL   Protein, ur NEGATIVE NEGATIVE mg/dL   Nitrite NEGATIVE NEGATIVE   Leukocytes,Ua LARGE (A) NEGATIVE   RBC / HPF 0-5 0 - 5 RBC/hpf   WBC, UA 11-20 0 - 5 WBC/hpf   Bacteria, UA FEW (A) NONE SEEN   Squamous Epithelial / LPF >50 (H) 0 - 5   Mucus PRESENT   Protein / creatinine ratio, urine     Status: Abnormal   Collection Time: 12/13/20  9:17 PM  Result Value Ref Range   Creatinine, Urine 88.27 mg/dL   Total Protein, Urine 17 mg/dL   Protein Creatinine Ratio 0.19 (H) 0.00 - 0.15 mg/mg[Cre]   MDM NST reactive and patient reports normal fetal movement CBC, CMP, protein/creat ratio Tyelnol PO- patient reports improvement of HA  Lengthy discussion regarding nutrition in pregnancy. Encouraged patient to eat frequent high protein snacks throughout the day.   Assessment and Plan   1. NST (non-stress test) reactive   2. [redacted] weeks gestation of pregnancy   3. Hypokalemia    -Discharge home in stable condition -Rx for potassium sent to patient's pharmacy -Fetal kick count precautions discussed -Patient advised to follow-up with OB as scheduled for prenatal care -Patient may return to MAU as needed or if her condition were to change or worsen   Rolm Bookbinder CNM 12/13/2020, 8:38 PM

## 2020-12-13 NOTE — MAU Note (Signed)
Pt reports she had a sharp pain around 1 am today and she felt the baby move right after that but has not felt movement since that time. Also reports a really bad headache this pm. Denies ROM, bleeding or contractions.

## 2020-12-15 ENCOUNTER — Other Ambulatory Visit: Payer: Self-pay

## 2020-12-15 ENCOUNTER — Ambulatory Visit: Payer: Medicaid Other | Admitting: *Deleted

## 2020-12-15 ENCOUNTER — Ambulatory Visit: Payer: Medicaid Other | Attending: Obstetrics and Gynecology

## 2020-12-15 ENCOUNTER — Encounter: Payer: Self-pay | Admitting: *Deleted

## 2020-12-15 DIAGNOSIS — F17210 Nicotine dependence, cigarettes, uncomplicated: Secondary | ICD-10-CM

## 2020-12-15 DIAGNOSIS — O99333 Smoking (tobacco) complicating pregnancy, third trimester: Secondary | ICD-10-CM | POA: Diagnosis not present

## 2020-12-15 DIAGNOSIS — Z3480 Encounter for supervision of other normal pregnancy, unspecified trimester: Secondary | ICD-10-CM

## 2020-12-15 DIAGNOSIS — O99213 Obesity complicating pregnancy, third trimester: Secondary | ICD-10-CM

## 2020-12-15 DIAGNOSIS — O365990 Maternal care for other known or suspected poor fetal growth, unspecified trimester, not applicable or unspecified: Secondary | ICD-10-CM | POA: Diagnosis present

## 2020-12-15 DIAGNOSIS — E669 Obesity, unspecified: Secondary | ICD-10-CM

## 2020-12-15 DIAGNOSIS — O365930 Maternal care for other known or suspected poor fetal growth, third trimester, not applicable or unspecified: Secondary | ICD-10-CM

## 2020-12-15 DIAGNOSIS — O43193 Other malformation of placenta, third trimester: Secondary | ICD-10-CM

## 2020-12-15 DIAGNOSIS — O99340 Other mental disorders complicating pregnancy, unspecified trimester: Secondary | ICD-10-CM

## 2020-12-15 DIAGNOSIS — F419 Anxiety disorder, unspecified: Secondary | ICD-10-CM | POA: Insufficient documentation

## 2020-12-15 DIAGNOSIS — O358XX0 Maternal care for other (suspected) fetal abnormality and damage, not applicable or unspecified: Secondary | ICD-10-CM

## 2020-12-15 DIAGNOSIS — Z720 Tobacco use: Secondary | ICD-10-CM

## 2020-12-15 DIAGNOSIS — Z3A33 33 weeks gestation of pregnancy: Secondary | ICD-10-CM

## 2020-12-15 NOTE — Procedures (Signed)
Shannon Ruiz 1996-08-13 [redacted]w[redacted]d  Fetus A Non-Stress Test Interpretation for 12/15/20  Indication: IUGR  Fetal Heart Rate A Mode: External Baseline Rate (A): 135 bpm Variability: Moderate Accelerations: 15 x 15 Decelerations: None Multiple birth?: No  Uterine Activity Mode: Palpation,Toco Contraction Frequency (min): none Resting Tone Palpated: Relaxed Resting Time: Adequate  Interpretation (Fetal Testing) Nonstress Test Interpretation: Reactive Overall Impression: Reassuring for gestational age Comments: Dr. Judeth Cornfield reviewed tracing.

## 2020-12-16 ENCOUNTER — Encounter (HOSPITAL_COMMUNITY)
Admission: RE | Admit: 2020-12-16 | Discharge: 2020-12-16 | Disposition: A | Discharge status: Home / Self Care | Payer: Medicaid Other | Source: Ambulatory Visit | Attending: Family Medicine | Admitting: Family Medicine

## 2020-12-16 ENCOUNTER — Other Ambulatory Visit: Payer: Self-pay

## 2020-12-16 DIAGNOSIS — O99013 Anemia complicating pregnancy, third trimester: Secondary | ICD-10-CM | POA: Diagnosis not present

## 2020-12-16 MED ORDER — SODIUM CHLORIDE 0.9 % IV SOLN
300.0000 mg | INTRAVENOUS | Status: AC
  Administered 2020-12-16: 300 mg via INTRAVENOUS
  Filled 2020-12-16 (×2): qty 15

## 2020-12-16 MED ORDER — SODIUM CHLORIDE 0.9 % IV SOLN
300.0000 mg | INTRAVENOUS | Status: DC
  Filled 2020-12-16: qty 15

## 2020-12-16 MED ORDER — ONDANSETRON HCL 4 MG/2ML IJ SOLN
INTRAMUSCULAR | Status: AC
  Administered 2020-12-16: 4 mg
  Filled 2020-12-16: qty 2

## 2020-12-16 MED ORDER — ONDANSETRON HCL 4 MG/2ML IJ SOLN: 4.0000 mg | INTRAMUSCULAR | Status: DC

## 2020-12-17 ENCOUNTER — Ambulatory Visit (INDEPENDENT_AMBULATORY_CARE_PROVIDER_SITE_OTHER): Payer: Medicaid Other | Admitting: Family Medicine

## 2020-12-17 ENCOUNTER — Encounter (HOSPITAL_COMMUNITY): Payer: Medicaid Other

## 2020-12-17 VITALS — BP 133/89 | HR 96 | Wt 210.0 lb

## 2020-12-17 DIAGNOSIS — O35BXX0 Maternal care for other (suspected) fetal abnormality and damage, fetal cardiac anomalies, not applicable or unspecified: Secondary | ICD-10-CM

## 2020-12-17 DIAGNOSIS — O43199 Other malformation of placenta, unspecified trimester: Secondary | ICD-10-CM | POA: Insufficient documentation

## 2020-12-17 DIAGNOSIS — F419 Anxiety disorder, unspecified: Secondary | ICD-10-CM

## 2020-12-17 DIAGNOSIS — O133 Gestational [pregnancy-induced] hypertension without significant proteinuria, third trimester: Secondary | ICD-10-CM

## 2020-12-17 DIAGNOSIS — O358XX0 Maternal care for other (suspected) fetal abnormality and damage, not applicable or unspecified: Secondary | ICD-10-CM

## 2020-12-17 DIAGNOSIS — Z3A33 33 weeks gestation of pregnancy: Secondary | ICD-10-CM

## 2020-12-17 DIAGNOSIS — O99340 Other mental disorders complicating pregnancy, unspecified trimester: Secondary | ICD-10-CM

## 2020-12-17 DIAGNOSIS — D508 Other iron deficiency anemias: Secondary | ICD-10-CM | POA: Insufficient documentation

## 2020-12-17 DIAGNOSIS — Z3480 Encounter for supervision of other normal pregnancy, unspecified trimester: Secondary | ICD-10-CM

## 2020-12-17 DIAGNOSIS — O365990 Maternal care for other known or suspected poor fetal growth, unspecified trimester, not applicable or unspecified: Secondary | ICD-10-CM

## 2020-12-17 NOTE — Progress Notes (Signed)
   PRENATAL VISIT NOTE  Subjective:  Shannon Ruiz is a 24 y.o. G2P0010 at [redacted]w[redacted]d being seen today for ongoing prenatal care.  She is currently monitored for the following issues for this high-risk pregnancy and has Supervision of other normal pregnancy, antepartum; Cigarette smoker; Anxiety disorder affecting pregnancy, antepartum; Ventricular septal defect (VSD) of fetus affecting management of pregnancy; Pregnancy affected by fetal growth restriction; Gestational hypertension; and Iron deficiency anemia secondary to inadequate dietary iron intake on their problem list.  Patient reports no complaints.  Contractions: Not present. Vag. Bleeding: None.  Movement: Present. Denies leaking of fluid.   The following portions of the patient's history were reviewed and updated as appropriate: allergies, current medications, past family history, past medical history, past social history, past surgical history and problem list.   Objective:   Vitals:   12/17/20 1010  BP: 133/89  Pulse: 96  Weight: 210 lb (95.3 kg)    Fetal Status: Fetal Heart Rate (bpm): 135 Fundal Height: 31 cm Movement: Present     General:  Alert, oriented and cooperative. Patient is in no acute distress.  Skin: Skin is warm and dry. No rash noted.   Cardiovascular: Normal heart rate noted  Respiratory: Normal respiratory effort, no problems with respiration noted  Abdomen: Soft, gravid, appropriate for gestational age.  Pain/Pressure: Absent     Pelvic: Cervical exam deferred        Extremities: Normal range of motion.  Edema: None  Mental Status: Normal mood and affect. Normal behavior. Normal judgment and thought content.   Assessment and Plan:  Pregnancy: G2P0010 at [redacted]w[redacted]d 1. [redacted] weeks gestation of pregnancy  2. Supervision of other normal pregnancy, antepartum FHT and FH normal  3. Gestational hypertension, third trimester BP high normal, but has had elevated BPs in MFM. Will continue to watch closely  4.  Ventricular septal defect (VSD) of fetus affecting management of pregnancy Rpt fetal echo shows that the VSD has resolbed  5. Anxiety disorder affecting pregnancy, antepartum  6. Iron deficiency anemia secondary to inadequate dietary iron intake S/p venofer x2 doses  7. Pregnancy affected by fetal growth restriction [place] 12/14: EFW 2.6% with AC 6%. Induce at 37 weeks per MFM BPPs normal. Normal dopplers.  8. Marginal cord insertion Monitor growth  Preterm labor symptoms and general obstetric precautions including but not limited to vaginal bleeding, contractions, leaking of fluid and fetal movement were reviewed in detail with the patient. Please refer to After Visit Summary for other counseling recommendations.   Return in about 2 weeks (around 12/31/2020) for OB f/u, GBS.  Future Appointments  Date Time Provider Department Center  12/22/2020  8:15 AM [facility] NURSE [facility]  12/22/2020  8:30 AM [facility] US3 [facility]  12/22/2020  9:45 AM [facility] NST [facility]  12/29/2020  7:15 AM [facility] NURSE [facility]  12/29/2020  7:30 AM [facility] US3 [facility]  12/29/2020  8:45 AM [facility] NST [facility]  12/31/2020  8:15 AM Levie Heritage, DO [facility] None  01/05/2021  7:30 AM [facility] NURSE [facility]  01/05/2021  7:45 AM [facility] US5 [facility]  01/05/2021  8:45 AM [facility] NST [facility]    Levie Heritage, DO

## 2020-12-22 ENCOUNTER — Ambulatory Visit: Payer: Medicaid Other | Admitting: *Deleted

## 2020-12-22 ENCOUNTER — Other Ambulatory Visit: Payer: Self-pay

## 2020-12-22 ENCOUNTER — Ambulatory Visit: Payer: Medicaid Other | Attending: Obstetrics and Gynecology

## 2020-12-22 ENCOUNTER — Encounter: Payer: Self-pay | Admitting: *Deleted

## 2020-12-22 DIAGNOSIS — Z3A34 34 weeks gestation of pregnancy: Secondary | ICD-10-CM

## 2020-12-22 DIAGNOSIS — F17210 Nicotine dependence, cigarettes, uncomplicated: Secondary | ICD-10-CM | POA: Insufficient documentation

## 2020-12-22 DIAGNOSIS — Z3480 Encounter for supervision of other normal pregnancy, unspecified trimester: Secondary | ICD-10-CM | POA: Insufficient documentation

## 2020-12-22 DIAGNOSIS — F17200 Nicotine dependence, unspecified, uncomplicated: Secondary | ICD-10-CM

## 2020-12-22 DIAGNOSIS — F419 Anxiety disorder, unspecified: Secondary | ICD-10-CM | POA: Insufficient documentation

## 2020-12-22 DIAGNOSIS — O365990 Maternal care for other known or suspected poor fetal growth, unspecified trimester, not applicable or unspecified: Secondary | ICD-10-CM | POA: Diagnosis not present

## 2020-12-22 DIAGNOSIS — O365930 Maternal care for other known or suspected poor fetal growth, third trimester, not applicable or unspecified: Secondary | ICD-10-CM | POA: Diagnosis not present

## 2020-12-22 DIAGNOSIS — O99213 Obesity complicating pregnancy, third trimester: Secondary | ICD-10-CM

## 2020-12-22 DIAGNOSIS — O99333 Smoking (tobacco) complicating pregnancy, third trimester: Secondary | ICD-10-CM

## 2020-12-22 DIAGNOSIS — O43193 Other malformation of placenta, third trimester: Secondary | ICD-10-CM

## 2020-12-22 DIAGNOSIS — O99340 Other mental disorders complicating pregnancy, unspecified trimester: Secondary | ICD-10-CM | POA: Insufficient documentation

## 2020-12-22 DIAGNOSIS — E669 Obesity, unspecified: Secondary | ICD-10-CM

## 2020-12-22 NOTE — Procedures (Signed)
Renley Gutman 22-Nov-1996 [redacted]w[redacted]d  Fetus A Non-Stress Test Interpretation for 12/22/20  Indication: IUGR  Fetal Heart Rate A Mode: External Baseline Rate (A): 125 bpm Variability: Moderate Accelerations: 15 x 15 Decelerations: None Multiple birth?: No  Uterine Activity Mode: Palpation,Toco Contraction Frequency (min): None Resting Tone Palpated: Relaxed Resting Time: Adequate  Interpretation (Fetal Testing) Nonstress Test Interpretation: Reactive Comments: Dr. Judeth Cornfield reviewed tracing.

## 2020-12-23 ENCOUNTER — Inpatient Hospital Stay (HOSPITAL_COMMUNITY)
Admission: AD | Admit: 2020-12-23 | Discharge: 2020-12-24 | Disposition: A | Discharge status: Home / Self Care | Payer: Medicaid Other | Attending: Family Medicine | Admitting: Family Medicine

## 2020-12-23 ENCOUNTER — Encounter (HOSPITAL_COMMUNITY): Payer: Self-pay | Admitting: Family Medicine

## 2020-12-23 ENCOUNTER — Telehealth: Payer: Self-pay

## 2020-12-23 ENCOUNTER — Inpatient Hospital Stay (HOSPITAL_COMMUNITY): Payer: Medicaid Other

## 2020-12-23 ENCOUNTER — Other Ambulatory Visit: Payer: Self-pay

## 2020-12-23 DIAGNOSIS — Z3A34 34 weeks gestation of pregnancy: Secondary | ICD-10-CM | POA: Insufficient documentation

## 2020-12-23 DIAGNOSIS — O210 Mild hyperemesis gravidarum: Secondary | ICD-10-CM | POA: Diagnosis not present

## 2020-12-23 DIAGNOSIS — O133 Gestational [pregnancy-induced] hypertension without significant proteinuria, third trimester: Secondary | ICD-10-CM

## 2020-12-23 DIAGNOSIS — O98513 Other viral diseases complicating pregnancy, third trimester: Secondary | ICD-10-CM | POA: Insufficient documentation

## 2020-12-23 DIAGNOSIS — R6883 Chills (without fever): Secondary | ICD-10-CM | POA: Diagnosis present

## 2020-12-23 DIAGNOSIS — O219 Vomiting of pregnancy, unspecified: Secondary | ICD-10-CM

## 2020-12-23 DIAGNOSIS — U071 COVID-19: Secondary | ICD-10-CM | POA: Diagnosis not present

## 2020-12-23 DIAGNOSIS — R52 Pain, unspecified: Secondary | ICD-10-CM

## 2020-12-23 LAB — PROTEIN / CREATININE RATIO, URINE
Creatinine, Urine: 113.16 mg/dL
Protein Creatinine Ratio: 0.25 mg/mg{Cre} — ABNORMAL HIGH (ref 0.00–0.15)
Total Protein, Urine: 28 mg/dL

## 2020-12-23 LAB — CBC WITH DIFFERENTIAL/PLATELET
Abs Immature Granulocytes: 0.71 10*3/uL — ABNORMAL HIGH (ref 0.00–0.07)
Basophils Absolute: 0.1 10*3/uL (ref 0.0–0.1)
Basophils Relative: 1 %
Eosinophils Absolute: 0.2 10*3/uL (ref 0.0–0.5)
Eosinophils Relative: 1 %
HCT: 28.6 % — ABNORMAL LOW (ref 36.0–46.0)
Hemoglobin: 10.1 g/dL — ABNORMAL LOW (ref 12.0–15.0)
Immature Granulocytes: 5 %
Lymphocytes Relative: 4 %
Lymphs Abs: 0.5 10*3/uL — ABNORMAL LOW (ref 0.7–4.0)
MCH: 30.3 pg (ref 26.0–34.0)
MCHC: 35.3 g/dL (ref 30.0–36.0)
MCV: 85.9 fL (ref 80.0–100.0)
Monocytes Absolute: 0.9 10*3/uL (ref 0.1–1.0)
Monocytes Relative: 7 %
Neutro Abs: 11.3 10*3/uL — ABNORMAL HIGH (ref 1.7–7.7)
Neutrophils Relative %: 82 %
Platelets: 326 10*3/uL (ref 150–400)
RBC: 3.33 MIL/uL — ABNORMAL LOW (ref 3.87–5.11)
RDW: 14.3 % (ref 11.5–15.5)
WBC: 13.7 10*3/uL — ABNORMAL HIGH (ref 4.0–10.5)
nRBC: 0 % (ref 0.0–0.2)

## 2020-12-23 LAB — URINALYSIS, ROUTINE W REFLEX MICROSCOPIC
Bilirubin Urine: NEGATIVE
Glucose, UA: NEGATIVE mg/dL
Hgb urine dipstick: NEGATIVE
Ketones, ur: 5 mg/dL — AB
Nitrite: NEGATIVE
Protein, ur: NEGATIVE mg/dL
Specific Gravity, Urine: 1.013 (ref 1.005–1.030)
pH: 6 (ref 5.0–8.0)

## 2020-12-23 LAB — COMPREHENSIVE METABOLIC PANEL
ALT: 9 U/L (ref 0–44)
AST: 12 U/L — ABNORMAL LOW (ref 15–41)
Albumin: 2.6 g/dL — ABNORMAL LOW (ref 3.5–5.0)
Alkaline Phosphatase: 101 U/L (ref 38–126)
Anion gap: 10 (ref 5–15)
BUN: <5 mg/dL — ABNORMAL LOW (ref 6–20)
CO2: 19 mmol/L — ABNORMAL LOW (ref 22–32)
Calcium: 8.3 mg/dL — ABNORMAL LOW (ref 8.9–10.3)
Chloride: 107 mmol/L (ref 98–111)
Creatinine, Ser: 0.49 mg/dL (ref 0.44–1.00)
GFR, Estimated: >60 mL/min (ref >60)
Glucose, Bld: 89 mg/dL (ref 70–99)
Potassium: 2.5 mmol/L — CL (ref 3.5–5.1)
Sodium: 136 mmol/L (ref 135–145)
Total Bilirubin: 0.4 mg/dL (ref 0.3–1.2)
Total Protein: 5.7 g/dL — ABNORMAL LOW (ref 6.5–8.1)

## 2020-12-23 LAB — RESP PANEL BY RT-PCR (FLU A&B, COVID) ARPGX2
Influenza A by PCR: NEGATIVE
Influenza B by PCR: NEGATIVE
SARS Coronavirus 2 by RT PCR: POSITIVE — AB

## 2020-12-23 LAB — TROPONIN I (HIGH SENSITIVITY): Troponin I (High Sensitivity): 4 ng/L (ref <18)

## 2020-12-23 LAB — BRAIN NATRIURETIC PEPTIDE: B Natriuretic Peptide: 128.3 pg/mL — ABNORMAL HIGH (ref 0.0–100.0)

## 2020-12-23 MED ORDER — LACTATED RINGERS IV BOLUS
1000.0000 mL | Freq: Once | INTRAVENOUS | Status: AC
  Administered 2020-12-23: 1000 mL via INTRAVENOUS

## 2020-12-23 MED ORDER — POTASSIUM CHLORIDE 2 MEQ/ML IV SOLN: Freq: Once | INTRAVENOUS | Status: DC

## 2020-12-23 MED ORDER — POTASSIUM CHLORIDE 2 MEQ/ML IV SOLN: INTRAVENOUS | Status: DC

## 2020-12-23 MED ORDER — POTASSIUM CHLORIDE 2 MEQ/ML IV SOLN
Freq: Once | INTRAVENOUS | Status: DC
  Filled 2020-12-23: qty 1000

## 2020-12-23 MED ORDER — POTASSIUM CHLORIDE 2 MEQ/ML IV SOLN
INTRAVENOUS | Status: DC
  Filled 2020-12-23 (×2): qty 1000

## 2020-12-23 MED ORDER — PROMETHAZINE HCL 25 MG PO TABS: 25.0000 mg | ORAL_TABLET | Freq: Four times a day (QID) | ORAL | 0 refills | Status: DC | PRN

## 2020-12-23 MED ORDER — ACETAMINOPHEN 500 MG PO TABS
1000.0000 mg | ORAL_TABLET | Freq: Once | ORAL | Status: AC
  Administered 2020-12-23: 1000 mg via ORAL
  Filled 2020-12-23: qty 2

## 2020-12-23 MED ORDER — PROMETHAZINE HCL 25 MG/ML IJ SOLN
25.0000 mg | Freq: Once | INTRAMUSCULAR | Status: AC
  Administered 2020-12-23: 25 mg via INTRAVENOUS
  Filled 2020-12-23: qty 1

## 2020-12-23 NOTE — MAU Note (Signed)
CRITICAL VALUE STICKER  CRITICAL VALUE: 2.5 Potassium  RECEIVER (on-site recipient of call):Everado Pillsbury Sullivan Lone, RN  DATE & TIME NOTIFIED: 12/23/2020; 1856  MESSENGER (representative from lab): Johnsie Kindred   Next Shift RN Notified.

## 2020-12-23 NOTE — MAU Note (Signed)
Having flu like symptoms, chills, body aches, can't keep anything down.  Having a hard time breathing. All started today. Coughing up a little bit of mucous

## 2020-12-23 NOTE — Telephone Encounter (Signed)
Patient's boyfriend called stating that patient woke up this morning with chills, body aches, and chest pressure. Patient is also having nausea and vomiting. Patient is 34. 5 weeks and states she does have fetal movement.  Instructed to go to ED for evaluation and testing for flu/ covid. [name] RN

## 2020-12-23 NOTE — MAU Provider Note (Signed)
Chief Complaint:  No chief complaint on file.   Event Date/Time   First Provider Initiated Contact with Patient 12/23/20 2020      HPI: Shannon Ruiz is a 24 y.o. G2P0010 at [redacted]w[redacted]d who presents to maternity admissions reporting onset of flu-like symptoms with chills, body aches, coughing and n/v today.  She has not had the COVID vaccine.  She has n/v in the pregnancy and had low potassium treated on 12/13/20 in the MAU. She has not picked up her prescribed PO potassium.  She has not taken anything for her symptoms today.  She denies any shortness of breath but feels chest pressure, like something is sitting on her chest.   She reports good fetal movement and denies any cramping/contractions. She does report constant back pain.      Location: generalized pain in back, legs and "everywhere" Quality: aching Severity: 9-10/10 on pain scale Duration: 1 day Timing: constant Modifying factors: none Associated signs and symptoms: n/v, chills, cough  HPI  Past Medical History: Past Medical History:  Diagnosis Date  . Anxiety   . Depression     Past obstetric history: OB History  Gravida Para Term Preterm AB Living  2       1    SAB IAB Ectopic Multiple Live Births  1            # Outcome Date GA Lbr Len/2nd Weight Sex Delivery Anes PTL Lv  2 Current           1 SAB 2014            Past Surgical History: Past Surgical History:  Procedure Laterality Date  . NO PAST SURGERIES      Family History: Family History  Problem Relation Age of Onset  . Anxiety disorder Mother   . Hypertension Mother   . Heart Problems Mother   . Diabetes Father     Social History: Social History   Tobacco Use  . Smoking status: Never Smoker  . Smokeless tobacco: Never Used  Vaping Use  . Vaping Use: Never used  Substance Use Topics  . Alcohol use: Never  . Drug use: Never    Allergies: No Known Allergies  Meds:  Medications Prior to Admission  Medication Sig Dispense Refill Last Dose   . ondansetron (ZOFRAN) 4 MG tablet Take 1 tablet (4 mg total) by mouth every 8 (eight) hours as needed for nausea or vomiting. 42 tablet 3 12/23/2020 at Unknown time  . ferrous gluconate (FERGON) 324 MG tablet Take 1 tablet (324 mg total) by mouth daily with breakfast. 90 tablet 3   . hydrOXYzine (VISTARIL) 25 MG capsule Take 1 capsule (25 mg total) by mouth 3 (three) times daily as needed for anxiety. 30 capsule 0 More than a month at Unknown time  . potassium chloride SA (KLOR-CON) 20 MEQ tablet Take 1 tablet (20 mEq total) by mouth 2 (two) times daily for 7 days. 14 tablet 0   . Prenatal Vit-Fe Fum-Fe Bisg-FA (NATACHEW) 28-1 MG CHEW Chew 1 tablet by mouth daily. 30 tablet 12     ROS:  Review of Systems  Constitutional: Positive for chills. Negative for fatigue and fever.  Eyes: Negative for visual disturbance.  Respiratory: Positive for cough. Negative for shortness of breath.   Cardiovascular: Negative for chest pain.  Gastrointestinal: Negative for abdominal pain, nausea and vomiting.  Genitourinary: Negative for difficulty urinating, dysuria, flank pain, pelvic pain, vaginal bleeding, vaginal discharge and vaginal pain.  Musculoskeletal:  Positive for myalgias.  Neurological: Negative for dizziness and headaches.  Psychiatric/Behavioral: Negative.      I have reviewed patient's Past Medical Hx, Surgical Hx, Family Hx, Social Hx, medications and allergies.   Physical Exam   Patient Vitals for the past 24 hrs:  BP Temp Temp src Pulse Resp SpO2 Height Weight  12/23/20 2101 127/75 -- -- (!) 125 -- 98 % -- --  12/23/20 2046 109/64 -- -- (!) 140 -- 98 % -- --  12/23/20 2031 122/80 -- -- (!) 130 -- 93 % -- --  12/23/20 1916 120/61 -- -- (!) 119 -- 98 % -- --  12/23/20 1906 129/80 -- -- (!) 121 -- -- -- --  12/23/20 1736 (!) 148/69 99 F (37.2 C) Oral (!) 124 (!) 23 96 % -- --  12/23/20 1713 (!) 143/75 99.1 F (37.3 C) -- (!) 133 (!) 24 97 % 5\' 3"  (1.6 m) 95.4 kg  12/23/20 1710 --  -- -- -- -- 99 % -- --   Constitutional: Well-developed, well-nourished female in moderate distress.  HEART: normal rate, heart sounds, regular rhythm RESP: normal effort, lung sounds clear and equal bilaterally GI: Abd soft, non-tender, gravid appropriate for gestational age.  MS: Extremities nontender, no edema, normal ROM Neurologic: Alert and oriented x 4.  GU: Neg CVAT.  PELVIC EXAM: Cervix pink, visually closed, without lesion, scant white creamy discharge, vaginal walls and external genitalia normal Bimanual exam: Cervix 0/Power/high, firm, anterior, neg CMT, uterus nontender, nonenlarged, adnexa without tenderness, enlargement, or mass     FHT:  Baseline 145, moderate variability, accelerations present, no decelerations Contractions: some irritability, mild to palpation   Labs: Results for orders placed or performed during the hospital encounter of 12/23/20 (from the past 24 hour(s))  Resp Panel by RT-PCR (Flu A&B, Covid) Nasopharyngeal Swab     Status: Abnormal   Collection Time: 12/23/20  5:27 PM   Specimen: Nasopharyngeal Swab; Nasopharyngeal(NP) swabs in vial transport medium  Result Value Ref Range   SARS Coronavirus 2 by RT PCR POSITIVE (A) NEGATIVE   Influenza A by PCR NEGATIVE NEGATIVE   Influenza B by PCR NEGATIVE NEGATIVE  Urinalysis, Routine w reflex microscopic Urine, Clean Catch     Status: Abnormal   Collection Time: 12/23/20  5:39 PM  Result Value Ref Range   Color, Urine YELLOW YELLOW   APPearance CLOUDY (A) CLEAR   Specific Gravity, Urine 1.013 1.005 - 1.030   pH 6.0 5.0 - 8.0   Glucose, UA NEGATIVE NEGATIVE mg/dL   Hgb urine dipstick NEGATIVE NEGATIVE   Bilirubin Urine NEGATIVE NEGATIVE   Ketones, ur 5 (A) NEGATIVE mg/dL   Protein, ur NEGATIVE NEGATIVE mg/dL   Nitrite NEGATIVE NEGATIVE   Leukocytes,Ua MODERATE (A) NEGATIVE   RBC / HPF 0-5 0 - 5 RBC/hpf   WBC, UA 11-20 0 - 5 WBC/hpf   Bacteria, UA RARE (A) NONE SEEN   Squamous Epithelial / LPF 21-50  0 - 5   Mucus PRESENT    Non Squamous Epithelial 0-5 (A) NONE SEEN  CBC with Differential/Platelet     Status: Abnormal   Collection Time: 12/23/20  5:55 PM  Result Value Ref Range   WBC 13.7 (H) 4.0 - 10.5 K/uL   RBC 3.33 (L) 3.87 - 5.11 MIL/uL   Hemoglobin 10.1 (L) 12.0 - 15.0 g/dL   HCT 91.428.6 (L) 78.236.0 - 95.646.0 %   MCV 85.9 80.0 - 100.0 fL   MCH 30.3 26.0 - 34.0 pg  MCHC 35.3 30.0 - 36.0 g/dL   RDW 65.7 84.6 - 96.2 %   Platelets 326 150 - 400 K/uL   nRBC 0.0 0.0 - 0.2 %   Neutrophils Relative % 82 %   Neutro Abs 11.3 (H) 1.7 - 7.7 K/uL   Lymphocytes Relative 4 %   Lymphs Abs 0.5 (L) 0.7 - 4.0 K/uL   Monocytes Relative 7 %   Monocytes Absolute 0.9 0.1 - 1.0 K/uL   Eosinophils Relative 1 %   Eosinophils Absolute 0.2 0.0 - 0.5 K/uL   Basophils Relative 1 %   Basophils Absolute 0.1 0.0 - 0.1 K/uL   Immature Granulocytes 5 %   Abs Immature Granulocytes 0.71 (H) 0.00 - 0.07 K/uL  Brain natriuretic peptide     Status: Abnormal   Collection Time: 12/23/20  5:55 PM  Result Value Ref Range   B Natriuretic Peptide 128.3 (H) 0.0 - 100.0 pg/mL  Comprehensive metabolic panel     Status: Abnormal   Collection Time: 12/23/20  5:55 PM  Result Value Ref Range   Sodium 136 135 - 145 mmol/L   Potassium 2.5 (LL) 3.5 - 5.1 mmol/L   Chloride 107 98 - 111 mmol/L   CO2 19 (L) 22 - 32 mmol/L   Glucose, Bld 89 70 - 99 mg/dL   BUN <5 (L) 6 - 20 mg/dL   Creatinine, Ser 9.52 0.44 - 1.00 mg/dL   Calcium 8.3 (L) 8.9 - 10.3 mg/dL   Total Protein 5.7 (L) 6.5 - 8.1 g/dL   Albumin 2.6 (L) 3.5 - 5.0 g/dL   AST 12 (L) 15 - 41 U/L   ALT 9 0 - 44 U/L   Alkaline Phosphatase 101 38 - 126 U/L   Total Bilirubin 0.4 0.3 - 1.2 mg/dL   GFR, Estimated >84 >13 mL/min   Anion gap 10 5 - 15  Troponin I (High Sensitivity)     Status: None   Collection Time: 12/23/20  5:55 PM  Result Value Ref Range   Troponin I (High Sensitivity) 4 <18 ng/L  Protein / creatinine ratio, urine     Status: Abnormal   Collection  Time: 12/23/20  6:11 PM  Result Value Ref Range   Creatinine, Urine 113.16 mg/dL   Total Protein, Urine 28 mg/dL   Protein Creatinine Ratio 0.25 (H) 0.00 - 0.15 mg/mg[Cre]   A/Positive/-- (07/06 1127)  Imaging:    MAU Course/MDM: Orders Placed This Encounter  Procedures  . Resp Panel by RT-PCR (Flu A&B, Covid) Nasopharyngeal Swab  . DG Chest Portable 1 View  . CBC with Differential/Platelet  . Brain natriuretic peptide  . Comprehensive metabolic panel  . Urinalysis, Routine w reflex microscopic Urine, Clean Catch  . Protein / creatinine ratio, urine  . Airborne and Contact precautions  . ED EKG  . Discharge patient    Meds ordered this encounter  Medications  . DISCONTD: lactated ringers 1,000 mL with potassium chloride 10 mEq infusion  . DISCONTD: lactated ringers 1,000 mL with potassium chloride 10 mEq infusion  . DISCONTD: lactated ringers 1,000 mL with potassium chloride 20 mEq infusion  . promethazine (PHENERGAN) injection 25 mg  . acetaminophen (TYLENOL) tablet 1,000 mg  . lactated ringers 1,000 mL with potassium chloride 20 mEq infusion  . promethazine (PHENERGAN) 25 MG tablet    Sig: Take 1 tablet (25 mg total) by mouth every 6 (six) hours as needed for nausea or vomiting.    Dispense:  30 tablet    Refill:  0    Order Specific Question:   Supervising Provider    Answer:   Reva Bores [[id number]]     NST reviewed and reactive COVID positive CXR normal, no evidence of pneumonia Potassium low at 2.5 so replaced with 20 meq K in LR over 2 hours Phenergan 25 mg IV and Tylenol 1000 mg PO  Consult Dr Despina Hidden with presentation, exam findings and test results.  D/C home with COVID and pregnancy precautions    Assessment: 1. COVID-19 affecting pregnancy in third trimester   2. Generalized body aches   3. Gestational hypertension, third trimester   4. Nausea and vomiting during pregnancy     Plan: Discharge home Labor precautions and fetal kick counts  Follow-up  Information    [facility] Follow up.   Specialty: Obstetrics and Gynecology Why: Return to MAU as needed for emergencies.  Contact information: [address] [place] [address] [phone]               Sharen Counter Certified Nurse-Midwife 12/23/2020 9:14 PM

## 2020-12-24 MED ORDER — POTASSIUM CHLORIDE 2 MEQ/ML IV SOLN
Freq: Once | INTRAVENOUS | Status: AC
  Filled 2020-12-24: qty 1000

## 2020-12-26 NOTE — L&D Delivery Note (Signed)
Shannon Ruiz is a 25 y.o. G2P0010 s/p SVD at [redacted]w[redacted]d. She was admitted for IOL for severe FGR.   ROM: 6h 52m with clear fluid GBS Status: negative Maximum Maternal Temperature: 99.6  Labor Progress and Delivery: Called to room and patient was complete and pushing. She progressed with augmentation to complete and pushed to deliver. Head delivered LOA. No nuchal cord present. Shoulder and body delivered in usual fashion. Infant with spontaneous cry, placed on mother's abdomen, dried and stimulated. Short umbilical cord noted. Cord clamping delayed by several minutes then clamped by CNM and cut by Gery Pray (FOB). Cord blood drawn. After 30 minutes of gentle cord traction - placenta not delivered. Dr Despina Hidden notified. Fundal rub and membrane sweep performed to manually remove the placenta. Placenta delivered intach - placenta to pathology. Fundus firm with massage and Pitocin. Labia, perineum, vagina, and cervix inspected inspected with left labial laceration- hemostatic, no repair needed. Mom and baby stable prior to transfer to postpartum. She plans on formula feeding. She is unsure of method for birth control.  Delivery Note At 6:40 AM a viable and healthy female was delivered via Vaginal, Spontaneous (Presentation: LOA ).  APGAR: 9, 9; weight pending at time of note.   Placenta status: Manual removal, Intact.  Cord: 3 vessels with the following complications: None.  Anesthesia: Epidural Episiotomy: None Lacerations: Labial Suture Repair: None Est. Blood Loss (mL):   Mom to postpartum.  Baby to Couplet care / Skin to Skin.  Sharyon Cable CNM 01/10/2021, 7:34 AM

## 2020-12-29 ENCOUNTER — Ambulatory Visit: Payer: Medicaid Other

## 2020-12-31 ENCOUNTER — Encounter (HOSPITAL_COMMUNITY): Payer: Self-pay

## 2020-12-31 ENCOUNTER — Telehealth (INDEPENDENT_AMBULATORY_CARE_PROVIDER_SITE_OTHER): Payer: Medicaid Other | Admitting: Family Medicine

## 2020-12-31 ENCOUNTER — Other Ambulatory Visit (HOSPITAL_COMMUNITY): Payer: Self-pay | Admitting: Advanced Practice Midwife

## 2020-12-31 ENCOUNTER — Telehealth (HOSPITAL_COMMUNITY): Payer: Self-pay | Admitting: *Deleted

## 2020-12-31 VITALS — BP 110/73 | HR 98

## 2020-12-31 DIAGNOSIS — Z3A35 35 weeks gestation of pregnancy: Secondary | ICD-10-CM

## 2020-12-31 DIAGNOSIS — O99343 Other mental disorders complicating pregnancy, third trimester: Secondary | ICD-10-CM

## 2020-12-31 DIAGNOSIS — O98513 Other viral diseases complicating pregnancy, third trimester: Secondary | ICD-10-CM

## 2020-12-31 DIAGNOSIS — F17210 Nicotine dependence, cigarettes, uncomplicated: Secondary | ICD-10-CM

## 2020-12-31 DIAGNOSIS — O99340 Other mental disorders complicating pregnancy, unspecified trimester: Secondary | ICD-10-CM

## 2020-12-31 DIAGNOSIS — F419 Anxiety disorder, unspecified: Secondary | ICD-10-CM

## 2020-12-31 DIAGNOSIS — O99013 Anemia complicating pregnancy, third trimester: Secondary | ICD-10-CM

## 2020-12-31 DIAGNOSIS — O365930 Maternal care for other known or suspected poor fetal growth, third trimester, not applicable or unspecified: Secondary | ICD-10-CM

## 2020-12-31 DIAGNOSIS — O133 Gestational [pregnancy-induced] hypertension without significant proteinuria, third trimester: Secondary | ICD-10-CM

## 2020-12-31 DIAGNOSIS — O43193 Other malformation of placenta, third trimester: Secondary | ICD-10-CM

## 2020-12-31 DIAGNOSIS — O43199 Other malformation of placenta, unspecified trimester: Secondary | ICD-10-CM

## 2020-12-31 DIAGNOSIS — O358XX0 Maternal care for other (suspected) fetal abnormality and damage, not applicable or unspecified: Secondary | ICD-10-CM

## 2020-12-31 DIAGNOSIS — Z3480 Encounter for supervision of other normal pregnancy, unspecified trimester: Secondary | ICD-10-CM

## 2020-12-31 DIAGNOSIS — D508 Other iron deficiency anemias: Secondary | ICD-10-CM

## 2020-12-31 DIAGNOSIS — U071 COVID-19: Secondary | ICD-10-CM

## 2020-12-31 DIAGNOSIS — O35BXX0 Maternal care for other (suspected) fetal abnormality and damage, fetal cardiac anomalies, not applicable or unspecified: Secondary | ICD-10-CM

## 2020-12-31 DIAGNOSIS — O365990 Maternal care for other known or suspected poor fetal growth, unspecified trimester, not applicable or unspecified: Secondary | ICD-10-CM

## 2020-12-31 DIAGNOSIS — O99333 Smoking (tobacco) complicating pregnancy, third trimester: Secondary | ICD-10-CM

## 2020-12-31 NOTE — Progress Notes (Signed)
OBSTETRICS PRENATAL VIRTUAL VISIT ENCOUNTER NOTE  Provider location: [facility]   I connected with Shannon Ruiz on 12/31/20 at  8:15 AM EST by MyChart Video Encounter at home and verified that I am speaking with the correct person using two identifiers.   I discussed the limitations, risks, security and privacy concerns of performing an evaluation and management service virtually and the availability of in person appointments. I also discussed with the patient that there may be a patient responsible charge related to this service. The patient expressed understanding and agreed to proceed. Subjective:  Shannon Ruiz is a 25 y.o. G2P0010 at [redacted]w[redacted]d being seen today for ongoing prenatal care.  She is currently monitored for the following issues for this high-risk pregnancy and has Supervision of other normal pregnancy, antepartum; Cigarette smoker; Anxiety disorder affecting pregnancy, antepartum; Ventricular septal defect (VSD) of fetus affecting management of pregnancy; Pregnancy affected by fetal growth restriction; Gestational hypertension; Iron deficiency anemia secondary to inadequate dietary iron intake; and Marginal insertion of umbilical cord affecting management of mother on their problem list.  Patient reports no complaints.  Contractions: Irregular. Vag. Bleeding: None.  Movement: Present. Denies any leaking of fluid.   The following portions of the patient's history were reviewed and updated as appropriate: allergies, current medications, past family history, past medical history, past social history, past surgical history and problem list.   Objective:   Vitals:   12/31/20 0810  BP: 110/73  Pulse: 98    Fetal Status:     Movement: Present     General:  Alert, oriented and cooperative. Patient is in no acute distress.  Respiratory: Normal respiratory effort, no problems with respiration noted  Mental Status: Normal mood and affect.  Normal behavior. Normal judgment and thought content.  Rest of physical exam deferred due to type of encounter  Imaging: DG Chest Portable 1 View  Result Date: 12/23/2020 CLINICAL DATA:  Shortness of breath pregnant patient EXAM: PORTABLE CHEST 1 VIEW COMPARISON:  01/10/2017 FINDINGS: Borderline to mild cardiomegaly. No edema, pleural effusion or pneumothorax. IMPRESSION: No active disease. Borderline to mild cardiomegaly. Electronically Signed   By: Jasmine Pang M.D.   On: 12/23/2020 17:59   [place] MFM FETAL BPP W/NONSTRESS  Result Date: 12/22/2020 ----------------------------------------------------------------------  OBSTETRICS REPORT                       (Signed Final 12/22/2020 09:28 am) ---------------------------------------------------------------------- Patient Info  ID #:       [id number]                          D.O.B.:  May 24, 1996 (24 yrs)  Name:       Shannon Ruiz                    Visit Date: 12/22/2020 08:35 am ---------------------------------------------------------------------- Performed By  Attending:        Noralee Space MD        Secondary Phy.:   [facility]  Performed By:     Marcellina Millin          Address:          [address]                    [address]  [address]  Referred By:      Elby ShowersMARIE L                [facility]         [facility]                             [facility]                                                             [facility]  Ref. Address:     [address]                    [address]                    [place],                    [phone] ---------------------------------------------------------------------- Orders  #  Description                           Code        Ordered By  1  US MFM UA CORD DOPPLER                76820.02    RAVI SHANKAR  2  US MFM FETAL BPP                      [id number]      [facility]     W/NONSTRESS ----------------------------------------------------------------------  #  Order #                     Accession #                Episode #  1  [id number]                   [id number]                 [id number]  2  [id number]                   [id number]                 [id number] ---------------------------------------------------------------------- Indications  Maternal care for known or suspected poor      O36.5930  fetal growth, third trimester, not applicable or  unspecified IUGR  [redacted] weeks gestation of pregnancy                Z3A.34  Tobacco use complicating pregnancy, third      O99.333  trimester  Obesity complicating pregnancy, third          O99.213  trimester  Marginal insertion of umbilical cord affecting O43.193  management of mother in third trimester ---------------------------------------------------------------------- Fetal Evaluation  Num Of Fetuses:         1  Fetal Heart Rate(bpm):  130  Cardiac Activity:       Observed  Presentation:           Cephalic  Placenta:               Anterior  Amniotic Fluid  AFI FV:      Within normal limits  AFI Sum(cm)     %Tile       Largest Pocket(cm)  17.68           65          6  RUQ(cm)       RLQ(cm)       LUQ(cm)        LLQ(cm)  6             2.92          5.92           2.84 ---------------------------------------------------------------------- Biophysical Evaluation  Amniotic F.V:   Within normal limits       F. Tone:        Observed  F. Movement:    Observed                   N.S.T:          Reactive  F. Breathing:   Observed                   Score:          10/10 ---------------------------------------------------------------------- OB History  Gravidity:    2         Term:   0         SAB:   10 ---------------------------------------------------------------------- Gestational Age  LMP:           34w 4d        Date:  04/24/20                 EDD:   01/29/21  Clinical EDD:  34w 4d                                        EDD:    01/29/21  Best:          34w 4d     Det. By:  LMP  (04/24/20)          EDD:   01/29/21 ---------------------------------------------------------------------- Anatomy  Stomach:               Appears normal, left   Bladder:                Appears normal                         sided ---------------------------------------------------------------------- Doppler - Fetal Vessels  Umbilical Artery   S/D     %tile      RI    %tile                             ADFV    RDFV   3.77       96    0.74       96  No      No ---------------------------------------------------------------------- Impression  Patient with severe fetal growth restriction return for antenatal  testing.  On ultrasound performed 2 weeks ago, the  estimated fetal weight was at the 3rd percentile.  She reports  good fetal movements.  Blood pressures today at her office were 147/80 and 128/78  mmHg.  Amniotic fluid is normal and good fetal activity seen.  Umbilical artery Doppler showed increased S/D ratio.  NST is  reactive.  BPP 10/10.  I reassured the patient of the findings.  We recommend  delivery at [redacted] weeks gestation.  Patient understands she will  be delivered earlier than [redacted] weeks gestation if antenatal  testing is not reassuring.  Antenatal corticosteroids may be  recommended if delivery is planned before [redacted] weeks  gestation. ---------------------------------------------------------------------- Recommendations  -Continue weekly BPP, NST and UA Doppler studies till  delivery.  -Fetal growth assessment next week.  -Delivery at [redacted] weeks gestation. ----------------------------------------------------------------------                  Noralee Space, MD Electronically Signed Final Report   12/22/2020 09:28 am ----------------------------------------------------------------------  [place] MFM FETAL BPP W/NONSTRESS  Result Date: 12/15/2020 ----------------------------------------------------------------------  OBSTETRICS  REPORT                       (Signed Final 12/15/2020 09:27 am) ---------------------------------------------------------------------- Patient Info  ID #:       [id number]                          D.O.B.:  03/31/1996 (24 yrs)  Name:       Shannon Ruiz                    Visit Date: 12/15/2020 07:19 am ---------------------------------------------------------------------- Performed By  Attending:        Noralee Space MD        Secondary Phy.:   [facility] [place]  Performed By:     Reinaldo Raddle            Address:          [address]                    [address]                                                             [address]  Referred By:      Elby Showers                Location:         [facility] for [facility]                             [facility] at  [facility]                                                             [facility]  Ref. Address:     [address]                    [place]                    [place]                    [phone] ---------------------------------------------------------------------- Orders  #  Description                           Code        Ordered By  1  [place] MFM UA CORD DOPPLER                76820.02    RAVI [facility]  2  [place] MFM FETAL BPP                      60454.0     RAVI [facility]     W/NONSTRESS ----------------------------------------------------------------------  #  Order #                     Accession #                Episode #  1  [id number]                   [id number]                 [id number]  2  [id number]                   [id number]                 [id number] ---------------------------------------------------------------------- Indications  Maternal care for known or suspected poor      O36.5930  fetal growth, third trimester, not applicable or  unspecified IUGR  Tobacco use complicating pregnancy, third      O99.333  trimester  Obesity complicating pregnancy, third          O99.213  trimester   Marginal insertion of umbilical cord affecting O43.193  management of mother in third trimester  [redacted] weeks gestation of pregnancy                Z3A.33 ---------------------------------------------------------------------- Fetal Evaluation  Num Of Fetuses:         1  Fetal Heart Rate(bpm):  124  Cardiac Activity:       Observed  Presentation:           Cephalic  Placenta:               Anterior  P. Cord Insertion:      Marginal insertion  Amniotic Fluid  AFI FV:      Within normal limits  AFI Sum(cm)     %Tile       Largest Pocket(cm)  20.96           79          7.28  RUQ(cm)       RLQ(cm)  LUQ(cm)        LLQ(cm)  6.38          7.28          1.87           5.43 ---------------------------------------------------------------------- Biophysical Evaluation  Amniotic F.V:   Pocket => 2 cm             F. Tone:        Observed  F. Movement:    Observed                   N.S.T:          Reactive  F. Breathing:   Observed                   Score:          10/10 ---------------------------------------------------------------------- OB History  Gravidity:    2         Term:   0         SAB:   10 ---------------------------------------------------------------------- Gestational Age  LMP:           33w 4d        Date:  04/24/20                 EDD:   01/29/21  Clinical EDD:  33w 4d                                        EDD:   01/29/21  Best:          33w 4d     Det. By:  LMP  (04/24/20)          EDD:   01/29/21 ---------------------------------------------------------------------- Anatomy  Diaphragm:             Appears normal         Kidneys:                Appear normal  Stomach:               Appears normal, left   Bladder:                Appears normal                         sided ---------------------------------------------------------------------- Doppler - Fetal Vessels  Umbilical Artery   S/D     %tile      RI    %tile                             ADFV    RDFV   4.33   > 97.5    0.77   > 97.5                                 No      No ---------------------------------------------------------------------- Cervix Uterus Adnexa  Cervix  Not visualized (advanced GA >24wks) ---------------------------------------------------------------------- Impression  Fetal growth restriction with abnormal umbilical artery  Doppler studies.  Marginal cord insertion.  Amniotic fluid is normal and good fetal activity is seen  .Antenatal testing is reassuring.  Umbilical artery Doppler  showed increased S/D ratio.  NST is reactive.  BPP 10/10. ----------------------------------------------------------------------  Noralee Space, MD Electronically Signed Final Report   12/15/2020 09:27 am ----------------------------------------------------------------------  [place] MFM FETAL BPP W/NONSTRESS  Result Date: 12/08/2020 ----------------------------------------------------------------------  OBSTETRICS REPORT                       (Signed Final 12/08/2020 03:21 pm) ---------------------------------------------------------------------- Patient Info  ID #:       [id number]                          D.O.B.:  [birth year] (24 yrs)  Name:       Shannon Ruiz                    Visit Date: 12/08/2020 07:49 am ---------------------------------------------------------------------- Performed By  Attending:        Noralee Space MD        Secondary Phy.:   [facility]  Performed By:     Jenel Lucks     Address:          [address]                    [place]                                                             Rd  Referred By:      Elby Showers                Location:         [facility]                             [facility] at                                                             [facility]                                                             [facility]  Ref. Address:     [address]                    [place]                    [place]                    [phone]  ---------------------------------------------------------------------- Orders  #  Description                           Code        Ordered By  1  [place] MFM OB FOLLOW UP  16109.60    Rosana Hoes  2  [place] MFM UA CORD DOPPLER                76820.02    YU FANG  3  [place] MFM FETAL BPP                      45409.8     Rosana Hoes     W/NONSTRESS ----------------------------------------------------------------------  #  Order #                     Accession #                Episode #  1  [id number]                   [id number]                 [id number]  2  [id number]                   [id number]                 [id number]  3  [id number]                   [id number]                 [id number] ---------------------------------------------------------------------- Indications  [redacted] weeks gestation of pregnancy                Z3A.32  Maternal care for known or suspected poor      O36.5930  fetal growth, third trimester, not applicable or  unspecified IUGR  Tobacco use complicating pregnancy, third      O99.333  trimester  Obesity complicating pregnancy, third          O99.213  trimester  Marginal insertion of umbilical cord affecting O43.193  management of mother in third trimester ---------------------------------------------------------------------- Fetal Evaluation  Num Of Fetuses:         1  Fetal Heart Rate(bpm):  137  Cardiac Activity:       Observed  Presentation:           Breech  Placenta:               Anterior  P. Cord Insertion:      Marginal insertion  Amniotic Fluid  AFI FV:      Within normal limits  AFI Sum(cm)     %Tile       Largest Pocket(cm)  17.6            64          6.4  RUQ(cm)       RLQ(cm)       LUQ(cm)        LLQ(cm)  4.8           6.4           2.8            3.6 ---------------------------------------------------------------------- Biophysical Evaluation  Amniotic F.V:   Pocket => 2 cm             F. Tone:        Observed  F. Movement:    Observed                   N.S.T:          Reactive  F.  Breathing:   Observed  Score:          10/10 ---------------------------------------------------------------------- Biometry  BPD:      82.4  mm     G. Age:  33w 1d         60  %    CI:        78.87   %    70 - 86                                                          FL/HC:      18.5   %    19.9 - 21.5  HC:      293.4  mm     G. Age:  32w 3d         12  %    HC/AC:      1.11        0.96 - 1.11  AC:      264.8  mm     G. Age:  30w 4d          6  %    FL/BPD:     66.0   %    71 - 87  FL:       54.4  mm     G. Age:  28w 5d        < 1  %    FL/AC:      20.5   %    20 - 24  LV:        5.2  mm  Est. FW:    1562  gm      3 lb 7 oz    2.6  % ---------------------------------------------------------------------- OB History  Gravidity:    2         Term:   0         SAB:   10 ---------------------------------------------------------------------- Gestational Age  LMP:           32w 4d        Date:  04/24/20                 EDD:   01/29/21  Clinical EDD:  32w 4d                                        EDD:   01/29/21  U/S Today:     31w 2d                                        EDD:   02/07/21  Best:          32w 4d     Det. By:  LMP  (04/24/20)          EDD:   01/29/21 ---------------------------------------------------------------------- Anatomy  Cranium:               Previously seen        LVOT:                   Appears normal  Cavum:  Previously seen        Aortic Arch:            Appears normal  Ventricles:            Appears normal         Ductal Arch:            Appears normal  Choroid Plexus:        Previously seen        Diaphragm:              Appears normal  Cerebellum:            Previously seen        Stomach:                Appears normal, left                                                                        sided  Posterior Fossa:       Previously seen        Abdomen:                Previously seen  Nuchal Fold:           Previously seen        Abdominal Wall:          Previously seen  Face:                  Orbits and profile     Cord Vessels:           Previously seen                         previously seen  Lips:                  Previously seen        Kidneys:                Appear normal  Palate:                Not well visualized    Bladder:                Appears normal  Thoracic:              Previously seen        Spine:                  Previously seen  Heart:                 Normal per Fetal       Upper Extremities:      Previously seen                         ECHO  RVOT:                  Previously seen        Lower Extremities:      Previously seen  Other:  Fetus appears to be female. Nasal bone, Heels/feet and open          hands/5th digits,  3VV, 3VTV, IVC/SVC previously seen. Technically          difficult due to maternal habitus and fetal position. ---------------------------------------------------------------------- Doppler - Fetal Vessels  Umbilical Artery   S/D     %tile      RI    %tile   4.58   > 97.5    0.78   > 97.5 ---------------------------------------------------------------------- Cervix Uterus Adnexa  Cervix  Not visualized (advanced GA >24wks)  Uterus  No abnormality visualized.  Right Ovary  Previously seen  Left Ovary  Previously seen.  Cul De Sac  No free fluid seen.  Adnexa  No abnormality visualized. ---------------------------------------------------------------------- Impression  Fetal growth restriction.  Patient return for fetal growth  assessment and antenatal testing.  Blood pressure today at  her office is 115/71 mmHg.  Patient does not have gestational  diabetes.  Patient had fetal echocardiography for suspected ventricular  septal defect and it was reported as normal with no structural  heart disease.  On ultrasound, the estimated fetal weight is at the 3rd  percentile.  Suboptimal interval growth is seen (363 g weight  gain over 3 weeks).  Amniotic fluid is normal and good fetal  activity seen.  Fetal breathing and movements meet the   criteria of BPP.  Umbilical artery Doppler showed increased  S/D ratio.  NST is reactive.  BPP 10/10.  I explained the finding of severe fetal growth restriction.  We  will continue to perform weekly antenatal testing.  If severe fetal growth restriction and abnormal Doppler  studies persist, we recommend delivery at [redacted] weeks  gestation. ---------------------------------------------------------------------- Recommendations  -Continue weekly BPP, NST and UA Doppler till delivery. ----------------------------------------------------------------------                  Noralee Space, MD Electronically Signed Final Report   12/08/2020 03:21 pm ----------------------------------------------------------------------  [place] MFM OB FOLLOW UP  Result Date: 12/08/2020 ----------------------------------------------------------------------  OBSTETRICS REPORT                       (Signed Final 12/08/2020 03:21 pm) ---------------------------------------------------------------------- Patient Info  ID #:       [id number]                          D.O.B.:  1996/05/09 (24 yrs)  Name:       Shannon Ruiz                    Visit Date: 12/08/2020 07:49 am ---------------------------------------------------------------------- Performed By  Attending:        Noralee Space MD        Secondary Phy.:   [facility] [place]  Performed By:     Jenel Lucks     Address:          [address]                    [address]                                                             [address]  Referred By:      Elby Showers                Location:  [facility]                             Fetal Care at                                                             [facility]                                                             Women  Ref. Address:     [address]                    [address]                    [place]                    [phone]  ---------------------------------------------------------------------- Orders  #  Description                           Code        Ordered By  1  [place] MFM OB FOLLOW UP                   82956.21    Rosana Hoes  2  [place] MFM UA CORD DOPPLER                76820.02    YU FANG  3  [place] MFM FETAL BPP                      30865.7     Rosana Hoes     W/NONSTRESS ----------------------------------------------------------------------  #  Order #                     Accession #                Episode #  1  [id number]                   [id number]                 [id number]  2  [id number]                   [id number]                 [id number]  3  [id number]                   [id number]                 [id number] ---------------------------------------------------------------------- Indications  [redacted] weeks gestation of pregnancy                Z3A.32  Maternal care for known or suspected poor      O36.5930  fetal growth, third trimester,  not applicable or  unspecified IUGR  Tobacco use complicating pregnancy, third      O99.333  trimester  Obesity complicating pregnancy, third          O99.213  trimester  Marginal insertion of umbilical cord affecting O43.193  management of mother in third trimester ---------------------------------------------------------------------- Fetal Evaluation  Num Of Fetuses:         1  Fetal Heart Rate(bpm):  137  Cardiac Activity:       Observed  Presentation:           Breech  Placenta:               Anterior  P. Cord Insertion:      Marginal insertion  Amniotic Fluid  AFI FV:      Within normal limits  AFI Sum(cm)     %Tile       Largest Pocket(cm)  17.6            64          6.4  RUQ(cm)       RLQ(cm)       LUQ(cm)        LLQ(cm)  4.8           6.4           2.8            3.6 ---------------------------------------------------------------------- Biophysical Evaluation  Amniotic F.V:   Pocket => 2 cm             F. Tone:        Observed  F. Movement:    Observed                   N.S.T:          Reactive  F.  Breathing:   Observed                   Score:          10/10 ---------------------------------------------------------------------- Biometry  BPD:      82.4  mm     G. Age:  33w 1d         60  %    CI:        78.87   %    70 - 86                                                          FL/HC:      18.5   %    19.9 - 21.5  HC:      293.4  mm     G. Age:  32w 3d         12  %    HC/AC:      1.11        0.96 - 1.11  AC:      264.8  mm     G. Age:  30w 4d          6  %    FL/BPD:     66.0   %    71 - 87  FL:       54.4  mm     G. Age:  28w 5d        < 1  %  FL/AC:      20.5   %    20 - 24  LV:        5.2  mm  Est. FW:    1562  gm      3 lb 7 oz    2.6  % ---------------------------------------------------------------------- OB History  Gravidity:    2         Term:   0         SAB:   10 ---------------------------------------------------------------------- Gestational Age  LMP:           32w 4d        Date:  04/24/20                 EDD:   01/29/21  Clinical EDD:  32w 4d                                        EDD:   01/29/21  U/S Today:     31w 2d                                        EDD:   02/07/21  Best:          32w 4d     Det. By:  LMP  (04/24/20)          EDD:   01/29/21 ---------------------------------------------------------------------- Anatomy  Cranium:               Previously seen        LVOT:                   Appears normal  Cavum:                 Previously seen        Aortic Arch:            Appears normal  Ventricles:            Appears normal         Ductal Arch:            Appears normal  Choroid Plexus:        Previously seen        Diaphragm:              Appears normal  Cerebellum:            Previously seen        Stomach:                Appears normal, left                                                                        sided  Posterior Fossa:       Previously seen        Abdomen:                Previously seen  Nuchal Fold:           Previously seen  Abdominal Wall:          Previously seen  Face:                  Orbits and profile     Cord Vessels:           Previously seen                         previously seen  Lips:                  Previously seen        Kidneys:                Appear normal  Palate:                Not well visualized    Bladder:                Appears normal  Thoracic:              Previously seen        Spine:                  Previously seen  Heart:                 Normal per Fetal       Upper Extremities:      Previously seen                         ECHO  RVOT:                  Previously seen        Lower Extremities:      Previously seen  Other:  Fetus appears to be female. Nasal bone, Heels/feet and open          hands/5th digits, 3VV, 3VTV, IVC/SVC previously seen. Technically          difficult due to maternal habitus and fetal position. ---------------------------------------------------------------------- Doppler - Fetal Vessels  Umbilical Artery   S/D     %tile      RI    %tile   4.58   > 97.5    0.78   > 97.5 ---------------------------------------------------------------------- Cervix Uterus Adnexa  Cervix  Not visualized (advanced GA >24wks)  Uterus  No abnormality visualized.  Right Ovary  Previously seen  Left Ovary  Previously seen.  Cul De Sac  No free fluid seen.  Adnexa  No abnormality visualized. ---------------------------------------------------------------------- Impression  Fetal growth restriction.  Patient return for fetal growth  assessment and antenatal testing.  Blood pressure today at  her office is 115/71 mmHg.  Patient does not have gestational  diabetes.  Patient had fetal echocardiography for suspected ventricular  septal defect and it was reported as normal with no structural  heart disease.  On ultrasound, the estimated fetal weight is at the 3rd  percentile.  Suboptimal interval growth is seen (363 g weight  gain over 3 weeks).  Amniotic fluid is normal and good fetal  activity seen.  Fetal breathing and movements meet the   criteria of BPP.  Umbilical artery Doppler showed increased  S/D ratio.  NST is reactive.  BPP 10/10.  I explained the finding of severe fetal growth restriction.  We  will continue to perform weekly antenatal testing.  If severe fetal growth restriction and abnormal Doppler  studies persist, we recommend  delivery at [redacted] weeks  gestation. ---------------------------------------------------------------------- Recommendations  -Continue weekly BPP, NST and UA Doppler till delivery. ----------------------------------------------------------------------                  Noralee Space, MD Electronically Signed Final Report   12/08/2020 03:21 pm ----------------------------------------------------------------------  [place] MFM OB LIMITED  Result Date: 12/02/2020 ----------------------------------------------------------------------  OBSTETRICS REPORT                       (Signed Final 12/02/2020 09:45 am) ---------------------------------------------------------------------- Patient Info  ID #:       [id number]                          D.O.B.:  September 15, 1996 (24 yrs)  Name:       Shannon Ruiz                    Visit Date: 12/02/2020 07:28 am ---------------------------------------------------------------------- Performed By  Attending:        Lin Landsman      Secondary Phy.:   [facility] [place]                    MD  Performed By:     Tommie Raymond BS,       Address:          [address]                    [place]                                                             [address]  Referred By:      Elby Showers                [facility]         [facility]                             [facility]  Ref. Address:     [address]                    [address]                    [place]  [id number]  ---------------------------------------------------------------------- Orders  #  Description                           Code        Ordered By  1  [place] MFM UA CORD DOPPLER                [id number]    YU FANG  2  [place] MFM OB LIMITED                     [id number]    YU FANG ----------------------------------------------------------------------  #  Order #                     Accession #                Episode #  1  [id number]                   [id number]                 [id number]  2  [id number]                   [id number]                 [id number] ---------------------------------------------------------------------- Indications  [redacted] weeks gestation of pregnancy                Z3A.31  Maternal care for known or suspected poor      O36.5930  fetal growth, third trimester, not applicable or  unspecified IUGR  Fetal cardiac anomaly affecting pregnancy,     O35.8XX0  antepartum (VSD)  LR Panorama  Obesity complicating pregnancy, third          O99.213  trimester (Pregravid BMI 34)  Tobacco use complicating pregnancy, third      O99.333  trimester  Marginal insertion of umbilical cord affecting O43.193  management of mother in third trimester  Encounter for other antenatal screening        Z36.2  follow-up ---------------------------------------------------------------------- Fetal Evaluation  Num Of Fetuses:         1  Fetal Heart Rate(bpm):  121  Cardiac Activity:       Observed  Presentation:           Cephalic  Placenta:               Anterior  P. Cord Insertion:      Marginal insertion previously  Amniotic Fluid  AFI FV:      Within normal limits  AFI Sum(cm)     %Tile       Largest Pocket(cm)  19.3            73          5.6  RUQ(cm)       RLQ(cm)       LUQ(cm)        LLQ(cm)  3.1           5.6           5.1            5.5 ---------------------------------------------------------------------- Biometry  CER:      40.3  mm     G. Age:  32w 3d         48  %  LV:        2.8  mm  ---------------------------------------------------------------------- OB History  Gravidity:  2         Term:   0         SAB:   10 ---------------------------------------------------------------------- Gestational Age  LMP:           31w 5d        Date:  04/24/20                 EDD:   01/29/21  Clinical EDD:  31w 5d                                        EDD:   01/29/21  Best:          31w 5d     Det. By:  LMP  (04/24/20)          EDD:   01/29/21 ---------------------------------------------------------------------- Anatomy  Cranium:               Previously seen        LVOT:                   Not well visualized  Cavum:                 Previously seen        Aortic Arch:            Previously seen  Ventricles:            Appears normal         Ductal Arch:            Previously seen  Choroid Plexus:        Previously seen        Diaphragm:              Appears normal  Cerebellum:            Previously seen        Stomach:                Appears normal, left                                                                        sided  Posterior Fossa:       Previously seen        Abdomen:                Previously seen  Nuchal Fold:           Previously seen        Abdominal Wall:         Previously seen  Face:                  Orbits and profile     Cord Vessels:           Previously seen                         previously seen  Lips:                  Previously seen        Kidneys:  Appear normal  Palate:                Not well visualized    Bladder:                Appears normal  Thoracic:              Previously seen        Spine:                  Previously seen  Heart:                 Normal per Fetal       Upper Extremities:      Previously seen                         ECHO  RVOT:                  Previously seen        Lower Extremities:      Previously seen  Other:  Fetus appears to be female. Nasal bone, Heels/feet and open          hands/5th digits, 3VV, 3VTV, IVC/SVC previously seen.  Technically          difficult due to maternal habitus and fetal position. ---------------------------------------------------------------------- Doppler - Fetal Vessels  Umbilical Artery   S/D     %tile      RI    %tile                             ADFV    RDFV    4.5   > 97.5    0.78   > 97.5                                No      No ---------------------------------------------------------------------- Cervix Uterus Adnexa  Cervix  Normal appearance by transabdominal scan.  Uterus  No abnormality visualized.  Right Ovary  Not visualized.  Left Ovary  Within normal limits.  Cul De Sac  No free fluid seen.  Adnexa  No abnormality visualized. ---------------------------------------------------------------------- Impression  Antenatal testing due to IUGR  NST reactive good fetal movement and amniotic fluid volume  UA Dopplers are again elevated with no evidence of AEDF or  REDF  Ms. Holder reports persistent nausea with occasional  vomiting. She was taking zofran but ran out. In addition, her  blood pressure today was 140/83 and 140/79 recent labs in  november were normal. Given her elevated blood pressure  and N/V, PIH labs were ordered today. She does not have a  headache so we will continue with outpatient management. I  will contact her if her labs are elevated for hospital admission  and possible BMZ.  She was counseled regarding s/sx of preeclampsia.  She has an OB appt tomorrow. ---------------------------------------------------------------------- Recommendations  Continue weekly testing  Repeat growth in 1-2 weeks  Report on preeclampsia symptoms. ----------------------------------------------------------------------               Lin Landsman, MD Electronically Signed Final Report   12/02/2020 09:45 am ----------------------------------------------------------------------  [place] MFM UA CORD DOPPLER  Result Date: 12/22/2020 ----------------------------------------------------------------------   OBSTETRICS REPORT                       (  Signed Final 12/22/2020 09:28 am) ---------------------------------------------------------------------- Patient Info  ID #:       [id number]                          D.O.B.:  [birth year] (24 yrs)  Name:       Shannon Ruiz                    Visit Date: 12/22/2020 08:35 am ---------------------------------------------------------------------- Performed By  Attending:        Noralee Space MD        Secondary Phy.:   [facility] [place]  Performed By:     Marcellina Millin          Address:          [address]                    [place]                                                             [address]  Referred By:      Elby Showers                Location:         [facility]                             [facility]                                                             [facility]  Ref. Address:     [address]                    [place]                    Jacky Kindle                    [phone] ---------------------------------------------------------------------- Orders  #  Description                           Code        Ordered By  1  [place] MFM UA CORD DOPPLER                76820.02    RAVI SHANKAR  2  [place] MFM FETAL BPP  19147.8     RAVI [facility]     W/NONSTRESS ----------------------------------------------------------------------  #  Order #                     Accession #                Episode #  1  [id number]                   [id number]                 [id number]  2  [id number]                   [id number]                 [id number] ---------------------------------------------------------------------- Indications  Maternal care for known or suspected poor      O36.5930  fetal growth, third trimester, not applicable or  unspecified IUGR  [redacted] weeks gestation of pregnancy                Z3A.34  Tobacco use complicating pregnancy, third      O99.333  trimester   Obesity complicating pregnancy, third          O99.213  trimester  Marginal insertion of umbilical cord affecting O43.193  management of mother in third trimester ---------------------------------------------------------------------- Fetal Evaluation  Num Of Fetuses:         1  Fetal Heart Rate(bpm):  130  Cardiac Activity:       Observed  Presentation:           Cephalic  Placenta:               Anterior  Amniotic Fluid  AFI FV:      Within normal limits  AFI Sum(cm)     %Tile       Largest Pocket(cm)  17.68           65          6  RUQ(cm)       RLQ(cm)       LUQ(cm)        LLQ(cm)  6             2.92          5.92           2.84 ---------------------------------------------------------------------- Biophysical Evaluation  Amniotic F.V:   Within normal limits       F. Tone:        Observed  F. Movement:    Observed                   N.S.T:          Reactive  F. Breathing:   Observed                   Score:          10/10 ---------------------------------------------------------------------- OB History  Gravidity:    2         Term:   0         SAB:   10 ---------------------------------------------------------------------- Gestational Age  LMP:           34w 4d        Date:  04/24/20                 EDD:   01/29/21  Clinical EDD:  34w 4d  EDD:   01/29/21  Best:          34w 4d     Det. By:  LMP  (04/24/20)          EDD:   01/29/21 ---------------------------------------------------------------------- Anatomy  Stomach:               Appears normal, left   Bladder:                Appears normal                         sided ---------------------------------------------------------------------- Doppler - Fetal Vessels  Umbilical Artery   S/D     %tile      RI    %tile                             ADFV    RDFV   3.77       96    0.74       96                                No      No ---------------------------------------------------------------------- Impression  Patient with  severe fetal growth restriction return for antenatal  testing.  On ultrasound performed 2 weeks ago, the  estimated fetal weight was at the 3rd percentile.  She reports  good fetal movements.  Blood pressures today at her office were 147/80 and 128/78  mmHg.  Amniotic fluid is normal and good fetal activity seen.  Umbilical artery Doppler showed increased S/D ratio.  NST is  reactive.  BPP 10/10.  I reassured the patient of the findings.  We recommend  delivery at [redacted] weeks gestation.  Patient understands she will  be delivered earlier than [redacted] weeks gestation if antenatal  testing is not reassuring.  Antenatal corticosteroids may be  recommended if delivery is planned before [redacted] weeks  gestation. ---------------------------------------------------------------------- Recommendations  -Continue weekly BPP, NST and UA Doppler studies till  delivery.  -Fetal growth assessment next week.  -Delivery at [redacted] weeks gestation. ----------------------------------------------------------------------                  Noralee Space, MD Electronically Signed Final Report   12/22/2020 09:28 am ----------------------------------------------------------------------  [place] MFM UA CORD DOPPLER  Result Date: 12/15/2020 ----------------------------------------------------------------------  OBSTETRICS REPORT                       (Signed Final 12/15/2020 09:27 am) ---------------------------------------------------------------------- Patient Info  ID #:       [id number]                          D.O.B.:  1996-08-08 (24 yrs)  Name:       Shannon Ruiz                    Visit Date: 12/15/2020 07:19 am ---------------------------------------------------------------------- Performed By  Attending:        Noralee Space MD        Secondary Phy.:   [facility] [place]  Performed By:     Reinaldo Raddle            Address:          [address]  [address]                                                             [address]  Referred By:      Elby Showers                 [facility]         [facility]                             Fetal Care at                                                             [facility]                                                             Women  Ref. Address:     [address]                    [place]                    [place]                    [phone] ---------------------------------------------------------------------- Orders  #  Description                           Code        Ordered By  1  [place] MFM UA CORD DOPPLER                76820.02    RAVI SHANKAR  2  [place] MFM FETAL BPP                      60454.0     RAVI [facility]     W/NONSTRESS ----------------------------------------------------------------------  #  Order #                     Accession #                Episode #  1  [id number]                   [id number]                 [id number]  2  [id number]                   [id number]                 [id number] ---------------------------------------------------------------------- Indications  Maternal care for known or suspected poor      O36.5930  fetal growth,  third trimester, not applicable or  unspecified IUGR  Tobacco use complicating pregnancy, third      O99.333  trimester  Obesity complicating pregnancy, third          O99.213  trimester  Marginal insertion of umbilical cord affecting O43.193  management of mother in third trimester  [redacted] weeks gestation of pregnancy                Z3A.33 ---------------------------------------------------------------------- Fetal Evaluation  Num Of Fetuses:         1  Fetal Heart Rate(bpm):  124  Cardiac Activity:       Observed  Presentation:           Cephalic  Placenta:               Anterior  P. Cord Insertion:      Marginal insertion  Amniotic Fluid  AFI FV:      Within normal limits  AFI Sum(cm)     %Tile       Largest Pocket(cm)  20.96           79          7.28  RUQ(cm)       RLQ(cm)       LUQ(cm)        LLQ(cm)  6.38          7.28           1.87           5.43 ---------------------------------------------------------------------- Biophysical Evaluation  Amniotic F.V:   Pocket => 2 cm             F. Tone:        Observed  F. Movement:    Observed                   N.S.T:          Reactive  F. Breathing:   Observed                   Score:          10/10 ---------------------------------------------------------------------- OB History  Gravidity:    2         Term:   0         SAB:   10 ---------------------------------------------------------------------- Gestational Age  LMP:           33w 4d        Date:  04/24/20                 EDD:   01/29/21  Clinical EDD:  33w 4d                                        EDD:   01/29/21  Best:          33w 4d     Det. By:  LMP  (04/24/20)          EDD:   01/29/21 ---------------------------------------------------------------------- Anatomy  Diaphragm:             Appears normal         Kidneys:                Appear normal  Stomach:               Appears normal, left   Bladder:  Appears normal                         sided ---------------------------------------------------------------------- Doppler - Fetal Vessels  Umbilical Artery   S/D     %tile      RI    %tile                             ADFV    RDFV   4.33   > 97.5    0.77   > 97.5                                No      No ---------------------------------------------------------------------- Cervix Uterus Adnexa  Cervix  Not visualized (advanced GA >24wks) ---------------------------------------------------------------------- Impression  Fetal growth restriction with abnormal umbilical artery  Doppler studies.  Marginal cord insertion.  Amniotic fluid is normal and good fetal activity is seen  .Antenatal testing is reassuring.  Umbilical artery Doppler  showed increased S/D ratio.  NST is reactive.  BPP 10/10. ----------------------------------------------------------------------                  Noralee Space, MD Electronically Signed Final  Report   12/15/2020 09:27 am ----------------------------------------------------------------------  [place] MFM UA CORD DOPPLER  Result Date: 12/08/2020 ----------------------------------------------------------------------  OBSTETRICS REPORT                       (Signed Final 12/08/2020 03:21 pm) ---------------------------------------------------------------------- Patient Info  ID #:       [id number]                          D.O.B.:  [birth year] (24 yrs)  Name:       Shannon Ruiz                    Visit Date: 12/08/2020 07:49 am ---------------------------------------------------------------------- Performed By  Attending:        Noralee Space MD        Secondary Phy.:   [facility] [place]  Performed By:     Jenel Lucks     Address:          [address]                    [place]                                                             [address]  Referred By:      Elby Showers                Location[facility]         [facility]                    Mayford Knife CNM                             [facility]  Ref. Address:     [address]                    [place]                    [place] ---------------------------------------------------------------------- Orders  #  Description                           Code        Ordered By  1  [place] MFM OB FOLLOW UP                   29476.54    Peterson Ao  2  [place] MFM UA CORD DOPPLER                76820.02    YU FANG  3  [place] MFM FETAL BPP                      65035.4     Peterson Ao     W/NONSTRESS ----------------------------------------------------------------------  #  Order #                     Accession #                Episode #  1  [id number]                   [id number]                 [id number]  2  [id number]                   [id number]                 [id number]  3  [id number]                   [id number]                  [id number] ---------------------------------------------------------------------- Indications  [redacted] weeks gestation of pregnancy                Z3A.88  Maternal care for known or suspected poor      O36.5930  fetal growth, third trimester, not applicable or  unspecified IUGR  Tobacco use complicating pregnancy, third      O99.333  trimester  Obesity complicating pregnancy, third          O99.213  trimester  Marginal insertion of umbilical cord affecting O43.193  management of mother in third trimester ---------------------------------------------------------------------- Fetal Evaluation  Num Of Fetuses:         1  Fetal Heart Rate(bpm):  137  Cardiac Activity:       Observed  Presentation:           Breech  Placenta:               Anterior  P. Cord Insertion:      Marginal insertion  Amniotic Fluid  AFI FV:      Within normal limits  AFI Sum(cm)     %Tile       Largest Pocket(cm)  17.6            64          6.4  RUQ(cm)       RLQ(cm)       LUQ(cm)        LLQ(cm)  4.8           6.4           2.8            3.6 ---------------------------------------------------------------------- Biophysical Evaluation  Amniotic F.V:   Pocket => 2 cm             F. Tone:        Observed  F. Movement:    Observed                   N.S.T:          Reactive  F. Breathing:   Observed                   Score:          10/10 ---------------------------------------------------------------------- Biometry  BPD:      82.4  mm     G. Age:  33w 1d         60  %    CI:        78.87   %    70 - 86                                                          FL/HC:      18.5   %    19.9 - 21.5  HC:      293.4  mm     G. Age:  32w 3d         12  %    HC/AC:      1.11        0.96 - 1.11  AC:      264.8  mm     G. Age:  30w 4d          6  %    FL/BPD:     66.0   %    71 - 87  FL:       54.4  mm     G. Age:  28w 5d        < 1  %    FL/AC:      20.5   %    20 - 24  LV:        5.2  mm  Est. FW:    1562  gm      3 lb 7 oz    2.6  %  ---------------------------------------------------------------------- OB History  Gravidity:    2         Term:   0         SAB:   10 ---------------------------------------------------------------------- Gestational Age  LMP:           32w 4d        Date:  04/24/20                 EDD:   01/29/21  Clinical EDD:  32w 4d                                        EDD:   01/29/21  U/S Today:     31w 2d                                        EDD:   02/07/21  Best:          32w 4d     Det. By:  LMP  (04/24/20)          EDD:   01/29/21 ---------------------------------------------------------------------- Anatomy  Cranium:               Previously seen        LVOT:                   Appears normal  Cavum:                 Previously seen        Aortic Arch:            Appears normal  Ventricles:            Appears normal         Ductal Arch:            Appears normal  Choroid Plexus:        Previously seen        Diaphragm:              Appears normal  Cerebellum:            Previously seen        Stomach:                Appears normal, left                                                                        sided  Posterior Fossa:       Previously seen        Abdomen:                Previously seen  Nuchal Fold:           Previously seen        Abdominal Wall:         Previously seen  Face:                  Orbits and profile     Cord Vessels:           Previously seen                         previously seen  Lips:                  Previously seen        Kidneys:  Appear normal  Palate:                Not well visualized    Bladder:                Appears normal  Thoracic:              Previously seen        Spine:                  Previously seen  Heart:                 Normal per Fetal       Upper Extremities:      Previously seen                         ECHO  RVOT:                  Previously seen        Lower Extremities:      Previously seen  Other:  Fetus appears to be female. Nasal bone, Heels/feet  and open          hands/5th digits, 3VV, 3VTV, IVC/SVC previously seen. Technically          difficult due to maternal habitus and fetal position. ---------------------------------------------------------------------- Doppler - Fetal Vessels  Umbilical Artery   S/D     %tile      RI    %tile   4.58   > 97.5    0.78   > 97.5 ---------------------------------------------------------------------- Cervix Uterus Adnexa  Cervix  Not visualized (advanced GA >24wks)  Uterus  No abnormality visualized.  Right Ovary  Previously seen  Left Ovary  Previously seen.  Cul De Sac  No free fluid seen.  Adnexa  No abnormality visualized. ---------------------------------------------------------------------- Impression  Fetal growth restriction.  Patient return for fetal growth  assessment and antenatal testing.  Blood pressure today at  her office is 115/71 mmHg.  Patient does not have gestational  diabetes.  Patient had fetal echocardiography for suspected ventricular  septal defect and it was reported as normal with no structural  heart disease.  On ultrasound, the estimated fetal weight is at the 3rd  percentile.  Suboptimal interval growth is seen (363 g weight  gain over 3 weeks).  Amniotic fluid is normal and good fetal  activity seen.  Fetal breathing and movements meet the  criteria of BPP.  Umbilical artery Doppler showed increased  S/D ratio.  NST is reactive.  BPP 10/10.  I explained the finding of severe fetal growth restriction.  We  will continue to perform weekly antenatal testing.  If severe fetal growth restriction and abnormal Doppler  studies persist, we recommend delivery at [redacted] weeks  gestation. ---------------------------------------------------------------------- Recommendations  -Continue weekly BPP, NST and UA Doppler till delivery. ----------------------------------------------------------------------                  Noralee Space, MD Electronically Signed Final Report   12/08/2020 03:21 pm  ----------------------------------------------------------------------  [place] MFM UA CORD DOPPLER  Result Date: 12/02/2020 ----------------------------------------------------------------------  OBSTETRICS REPORT                       (Signed Final 12/02/2020 09:45 am) ---------------------------------------------------------------------- Patient Info  ID #:       [id number]  D.O.B.:  1996-09-24 (24 yrs)  Name:       Shannon Ruiz                    Visit Date: 12/02/2020 07:28 am ---------------------------------------------------------------------- Performed By  Attending:        Lin Landsman      Secondary Phy.:   [facility]                    MD  Performed By:     Tommie Raymond BS,       Address:          [address]                    [place], [place]                                                             Rd  Referred By:      Elby Showers                Location[facility]         [facility]                             Fetal Care at                                                             [facility] for                                                             Women  Ref. Address:     [address]                    [place]                    Jacky Kindle                    [phone] ---------------------------------------------------------------------- Orders  #  Description                           Code        Ordered By  1  [place] MFM UA CORD DOPPLER                76820.02    YU FANG  2  [place] MFM OB LIMITED                     60454.09    YU FANG ----------------------------------------------------------------------  #  Order #  Accession #                Episode #  1  [id number]                   [id number]                 [id number]  2  [id number]                   [id number]                 [id number] ---------------------------------------------------------------------- Indications  [redacted] weeks gestation of pregnancy                 Z3A.31  Maternal care for known or suspected poor      O36.5930  fetal growth, third trimester, not applicable or  unspecified IUGR  Fetal cardiac anomaly affecting pregnancy,     O35.8XX0  antepartum (VSD)  LR Panorama  Obesity complicating pregnancy, third          O99.213  trimester (Pregravid BMI 34)  Tobacco use complicating pregnancy, third      O99.333  trimester  Marginal insertion of umbilical cord affecting O43.193  management of mother in third trimester  Encounter for other antenatal screening        Z36.2  follow-up ---------------------------------------------------------------------- Fetal Evaluation  Num Of Fetuses:         1  Fetal Heart Rate(bpm):  121  Cardiac Activity:       Observed  Presentation:           Cephalic  Placenta:               Anterior  P. Cord Insertion:      Marginal insertion previously  Amniotic Fluid  AFI FV:      Within normal limits  AFI Sum(cm)     %Tile       Largest Pocket(cm)  19.3            73          5.6  RUQ(cm)       RLQ(cm)       LUQ(cm)        LLQ(cm)  3.1           5.6           5.1            5.5 ---------------------------------------------------------------------- Biometry  CER:      40.3  mm     G. Age:  32w 3d         48  %  LV:        2.8  mm ---------------------------------------------------------------------- OB History  Gravidity:    2         Term:   0         SAB:   10 ---------------------------------------------------------------------- Gestational Age  LMP:           31w 5d        Date:  04/24/20                 EDD:   01/29/21  Clinical EDD:  31w 5d                                        EDD:   01/29/21  Best:  31w 5d     Det. By:  LMP  (04/24/20)          EDD:   01/29/21 ---------------------------------------------------------------------- Anatomy  Cranium:               Previously seen        LVOT:                   Not well visualized  Cavum:                 Previously seen        Aortic Arch:            Previously seen  Ventricles:             Appears normal         Ductal Arch:            Previously seen  Choroid Plexus:        Previously seen        Diaphragm:              Appears normal  Cerebellum:            Previously seen        Stomach:                Appears normal, left                                                                        sided  Posterior Fossa:       Previously seen        Abdomen:                Previously seen  Nuchal Fold:           Previously seen        Abdominal Wall:         Previously seen  Face:                  Orbits and profile     Cord Vessels:           Previously seen                         previously seen  Lips:                  Previously seen        Kidneys:                Appear normal  Palate:                Not well visualized    Bladder:                Appears normal  Thoracic:              Previously seen        Spine:                  Previously seen  Heart:                 Normal per Fetal       Upper Extremities:  Previously seen                         ECHO  RVOT:                  Previously seen        Lower Extremities:      Previously seen  Other:  Fetus appears to be female. Nasal bone, Heels/feet and open          hands/5th digits, 3VV, 3VTV, IVC/SVC previously seen. Technically          difficult due to maternal habitus and fetal position. ---------------------------------------------------------------------- Doppler - Fetal Vessels  Umbilical Artery   S/D     %tile      RI    %tile                             ADFV    RDFV    4.5   > 97.5    0.78   > 97.5                                No      No ---------------------------------------------------------------------- Cervix Uterus Adnexa  Cervix  Normal appearance by transabdominal scan.  Uterus  No abnormality visualized.  Right Ovary  Not visualized.  Left Ovary  Within normal limits.  Cul De Sac  No free fluid seen.  Adnexa  No abnormality visualized. ----------------------------------------------------------------------  Impression  Antenatal testing due to IUGR  NST reactive good fetal movement and amniotic fluid volume  UA Dopplers are again elevated with no evidence of AEDF or  REDF  Ms. Holder reports persistent nausea with occasional  vomiting. She was taking zofran but ran out. In addition, her  blood pressure today was 140/83 and 140/79 recent labs in  november were normal. Given her elevated blood pressure  and N/V, PIH labs were ordered today. She does not have a  headache so we will continue with outpatient management. I  will contact her if her labs are elevated for hospital admission  and possible BMZ.  She was counseled regarding s/sx of preeclampsia.  She has an OB appt tomorrow. ---------------------------------------------------------------------- Recommendations  Continue weekly testing  Repeat growth in 1-2 weeks  Report on preeclampsia symptoms. ----------------------------------------------------------------------               Lin Landsman, MD Electronically Signed Final Report   12/02/2020 09:45 am ----------------------------------------------------------------------   Assessment and Plan:  Pregnancy: G2P0010 at [redacted]w[redacted]d 1. Supervision of other normal pregnancy, antepartum Good fetal movement. Still has nausea - takes zofran daily  2. Cigarette smoker  3. Anxiety disorder affecting pregnancy, antepartum   4. COVID-19 affecting pregnancy in third trimester Symptoms resolving  5. Gestational hypertension, third trimester Induction at 37 weeks  6. Marginal insertion of umbilical cord affecting management of mother IOL at 37 weeks. EFW on 12/14 was 2.6%tile.  Last dopplers showed increased S/D ratio. BPP 10/10 Rpt [place] on 1/11.  7. Ventricular septal defect (VSD) of fetus affecting management of pregnancy Resolved per last fetal echo  8. Pregnancy affected by fetal growth restriction IOL at 37 weeks. EFW on 12/14 was 2.6%tile.  Last dopplers showed increased S/D ratio. BPP  10/10  9. Iron deficiency anemia secondary to inadequate dietary iron intake S/p venofer  10. [redacted] weeks gestation of pregnancy  Preterm labor symptoms and general obstetric precautions including but not limited to vaginal bleeding, contractions, leaking of fluid and fetal movement were reviewed in detail with the patient. I discussed the assessment and treatment plan with the patient. The patient was provided an opportunity to ask questions and all were answered. The patient agreed with the plan and demonstrated an understanding of the instructions. The patient was advised to call back or seek an in-person office evaluation/go to [facility]U at [facility] for any urgent or concerning symptoms. Please refer to After Visit Summary for other counseling recommendations.   I provided 11 minutes of face-to-face time during this encounter.  Return in about 4 days (around 01/04/2021) for GBS.  Future Appointments  Date Time Provider Department Center  01/05/2021  7:30 AM [facility] NURSE [facility]  01/05/2021  7:45 AM [facility] US5 [facility]  01/05/2021  8:45 AM [facility] NST [facility]    Levie Heritage, DO [facility] [employer], [facility]

## 2020-12-31 NOTE — Telephone Encounter (Signed)
Preadmission screen  

## 2021-01-01 ENCOUNTER — Encounter (HOSPITAL_COMMUNITY): Payer: Self-pay | Admitting: *Deleted

## 2021-01-01 ENCOUNTER — Telehealth (HOSPITAL_COMMUNITY): Payer: Self-pay | Admitting: *Deleted

## 2021-01-01 NOTE — Telephone Encounter (Signed)
Preadmission screen  

## 2021-01-04 ENCOUNTER — Encounter: Payer: Self-pay | Admitting: Obstetrics & Gynecology

## 2021-01-04 ENCOUNTER — Ambulatory Visit (INDEPENDENT_AMBULATORY_CARE_PROVIDER_SITE_OTHER): Payer: Medicaid Other | Admitting: Obstetrics & Gynecology

## 2021-01-04 ENCOUNTER — Other Ambulatory Visit: Payer: Self-pay

## 2021-01-04 ENCOUNTER — Other Ambulatory Visit (HOSPITAL_COMMUNITY)
Admission: RE | Admit: 2021-01-04 | Discharge: 2021-01-04 | Disposition: A | Discharge status: Home / Self Care | Payer: Medicaid Other | Source: Ambulatory Visit | Attending: Obstetrics & Gynecology | Admitting: Obstetrics & Gynecology

## 2021-01-04 VITALS — BP 124/87 | HR 101 | Wt 211.0 lb

## 2021-01-04 DIAGNOSIS — O98513 Other viral diseases complicating pregnancy, third trimester: Secondary | ICD-10-CM

## 2021-01-04 DIAGNOSIS — O133 Gestational [pregnancy-induced] hypertension without significant proteinuria, third trimester: Secondary | ICD-10-CM

## 2021-01-04 DIAGNOSIS — Z3480 Encounter for supervision of other normal pregnancy, unspecified trimester: Secondary | ICD-10-CM

## 2021-01-04 DIAGNOSIS — O365990 Maternal care for other known or suspected poor fetal growth, unspecified trimester, not applicable or unspecified: Secondary | ICD-10-CM

## 2021-01-04 DIAGNOSIS — F17210 Nicotine dependence, cigarettes, uncomplicated: Secondary | ICD-10-CM

## 2021-01-04 DIAGNOSIS — O99340 Other mental disorders complicating pregnancy, unspecified trimester: Secondary | ICD-10-CM

## 2021-01-04 DIAGNOSIS — O0993 Supervision of high risk pregnancy, unspecified, third trimester: Secondary | ICD-10-CM | POA: Insufficient documentation

## 2021-01-04 DIAGNOSIS — Z3A36 36 weeks gestation of pregnancy: Secondary | ICD-10-CM | POA: Diagnosis present

## 2021-01-04 DIAGNOSIS — U071 COVID-19: Secondary | ICD-10-CM

## 2021-01-04 DIAGNOSIS — F419 Anxiety disorder, unspecified: Secondary | ICD-10-CM

## 2021-01-04 DIAGNOSIS — O358XX0 Maternal care for other (suspected) fetal abnormality and damage, not applicable or unspecified: Secondary | ICD-10-CM

## 2021-01-04 DIAGNOSIS — D508 Other iron deficiency anemias: Secondary | ICD-10-CM

## 2021-01-04 DIAGNOSIS — O35BXX0 Maternal care for other (suspected) fetal abnormality and damage, fetal cardiac anomalies, not applicable or unspecified: Secondary | ICD-10-CM

## 2021-01-04 DIAGNOSIS — O43199 Other malformation of placenta, unspecified trimester: Secondary | ICD-10-CM

## 2021-01-04 NOTE — Progress Notes (Signed)
   PRENATAL VISIT NOTE  Subjective:  Shannon Ruiz is a 25 y.o. G2P0010 at [redacted]w[redacted]d being seen today for ongoing prenatal care.  She is currently monitored for the following issues for this high-risk pregnancy and has Supervision of other normal pregnancy, antepartum; Cigarette smoker; Anxiety disorder affecting pregnancy, antepartum; Ventricular septal defect (VSD) of fetus affecting management of pregnancy; Pregnancy affected by fetal growth restriction; Gestational hypertension; Iron deficiency anemia secondary to inadequate dietary iron intake; Marginal insertion of umbilical cord affecting management of mother; and COVID-19 affecting pregnancy in third trimester on their problem list.  Patient reports no complaints and lower pelvic pressure.  Contractions: Irregular. Vag. Bleeding: None.  Movement: Present. Denies leaking of fluid.   The following portions of the patient's history were reviewed and updated as appropriate: allergies, current medications, past family history, past medical history, past social history, past surgical history and problem list.   Objective:   Vitals:   01/04/21 1027  BP: 124/87  Pulse: (!) 101  Weight: 211 lb (95.7 kg)    Fetal Status:     Movement: Present     General:  Alert, oriented and cooperative. Patient is in no acute distress.  Skin: Skin is warm and dry. No rash noted.   Cardiovascular: Normal heart rate noted  Respiratory: Normal respiratory effort, no problems with respiration noted  Abdomen: Soft, gravid, appropriate for gestational age.  Pain/Pressure: Present     Pelvic: Cervical exam performed in the presence of a chaperone        Extremities: Normal range of motion.  Edema: None  Mental Status: Normal mood and affect. Normal behavior. Normal judgment and thought content.   Assessment and Plan:  Pregnancy: G2P0010 at [redacted]w[redacted]d 1. [redacted] weeks gestation of pregnancy cx and GBS done today    2. Supervision of other normal pregnancy,  antepartum For IOL in 4 days.  Would not do outpt foley due to severe IUGR   3. Ventricular septal defect (VSD) of fetus affecting management of pregnancy Resolved on last US/echo  4. Cigarette smoker  5. Anxiety disorder affecting pregnancy, antepartum  6. Pregnancy affected by fetal growth restriction Patient with severe fetal growth restriction return for antenatal  testing.  On ultrasound performed 2 weeks ago, the  estimated fetal weight was at the 3rd percentile.  She reports  good fetal movements. For IOL at 37 weeks.   7. Gestational hypertension, third trimester BP normal today   8. Marginal insertion of umbilical cord affecting management of mother  76. Iron deficiency anemia secondary to inadequate dietary iron intake  10. COVID-19 affecting pregnancy in third trimester Sx improved. Still with mild cough  Preterm labor symptoms and general obstetric precautions including but not limited to vaginal bleeding, contractions, leaking of fluid and fetal movement were reviewed in detail with the patient. Please refer to After Visit Summary for other counseling recommendations.   Return in about 3 weeks (around 01/25/2021).  Future Appointments  Date Time Provider Department Center  01/05/2021  7:30 AM [facility] NURSE [facility]  01/05/2021  7:45 AM [facility] US5 [facility]  01/05/2021  8:45 AM [facility] NST [facility]  01/08/2021  7:15 AM [facility] SCHED ROOM [facility] None    Willodean Rosenthal, MD

## 2021-01-04 NOTE — Patient Instructions (Signed)
Labor Induction Labor induction is when steps are taken to cause a pregnant woman to begin the labor process. Most women go into labor on their own between 37 weeks and 42 weeks of pregnancy. When this does not happen, or when there is a medical need for labor to begin, steps may be taken to induce, or bring on, labor. Labor induction causes a pregnant woman's uterus to contract. It also causes the cervix to soften (ripen), open (dilate), and thin out. Usually, labor is not induced before 39 weeks of pregnancy unless there is a medical reason to do so. When is labor induction considered? Labor induction may be right for you if:  Your pregnancy lasts longer than 41 to 42 weeks.  Your placenta is separating from your uterus (placental abruption).  You have a rupture of membranes and your labor does not begin.  You have health problems, like diabetes or high blood pressure (preeclampsia) during your pregnancy.  Your baby has stopped growing or does not have enough amniotic fluid. Before labor induction begins, your health care provider will consider the following factors:  Your medical condition and the baby's condition.  How many weeks you have been pregnant.  How mature the baby's lungs are.  The condition of your cervix.  The position of the baby.  The size of your birth canal. Tell a health care provider about:  Any allergies you have.  All medicines you are taking, including vitamins, herbs, eye drops, creams, and over-the-counter medicines.  Any problems you or your family members have had with anesthetic medicines.  Any surgeries you have had.  Any blood disorders you have.  Any medical conditions you have. What are the risks? Generally, this is a safe procedure. However, problems may occur, including:  Failed induction.  Changes in fetal heart rate, such as being too high, too low, or irregular (erratic).  Infection in the mother or the baby.  Increased risk of  having a cesarean delivery.  Breaking off (abruption) of the placenta from the uterus. This is rare.  Rupture of the uterus. This is very rare.  Your baby could fail to get enough blood flow or oxygen. This can be life-threatening. When induction is needed for medical reasons, the benefits generally outweigh the risks. What happens during the procedure? During the procedure, your health care provider will use one of these methods to induce labor:  Stripping the membranes. In this method, the amniotic sac tissue is gently separated from the cervix. This causes the following to happen: ? Your cervix stretches, which in turn causes the release of prostaglandins. ? Prostaglandins induce labor and cause the uterus to contract. ? This procedure is often done in an office visit. You will be sent home to wait for contractions to begin.  Prostaglandin medicine. This medicine starts contractions and causes the cervix to dilate and ripen. This can be taken by mouth (orally) or by being inserted into the vagina (suppository).  Inserting a small, thin tube (catheter) with a balloon into the vagina and then expanding the balloon with water to dilate the cervix.  Breaking the water. In this method, a small instrument is used to make a small hole in the amniotic sac. This eventually causes the amniotic sac to break. Contractions should begin within a few hours.  Medicine to trigger or strengthen contractions. This medicine is given through an IV that is inserted into a vein in your arm. This procedure may vary among health care providers and hospitals.     Where to find more information  March of D[employer]: www.marchofdimes.org  The American College of Obstetricians and Gynecologists: www.acog.org Summary  Labor induction causes a pregnant woman's uterus to contract. It also causes the cervix to soften (ripen), open (dilate), and thin out.  Labor is usually not induced before 39 weeks of pregnancy unless  there is a medical reason to do so.  When induction is needed for medical reasons, the benefits generally outweigh the risks.  Talk with your health care provider about which methods of labor induction are right for you. This information is not intended to replace advice given to you by your health care provider. Make sure you discuss any questions you have with your health care provider. Document Revised: 09/24/2020 Document Reviewed: 09/24/2020 Elsevier Patient Education  2021 Elsevier Inc.  

## 2021-01-05 ENCOUNTER — Ambulatory Visit: Payer: Medicaid Other | Attending: Obstetrics and Gynecology

## 2021-01-05 ENCOUNTER — Ambulatory Visit: Payer: Medicaid Other | Admitting: *Deleted

## 2021-01-05 ENCOUNTER — Encounter: Payer: Self-pay | Admitting: *Deleted

## 2021-01-05 DIAGNOSIS — O365990 Maternal care for other known or suspected poor fetal growth, unspecified trimester, not applicable or unspecified: Secondary | ICD-10-CM

## 2021-01-05 DIAGNOSIS — F17210 Nicotine dependence, cigarettes, uncomplicated: Secondary | ICD-10-CM

## 2021-01-05 DIAGNOSIS — O365930 Maternal care for other known or suspected poor fetal growth, third trimester, not applicable or unspecified: Secondary | ICD-10-CM | POA: Diagnosis present

## 2021-01-05 DIAGNOSIS — E669 Obesity, unspecified: Secondary | ICD-10-CM

## 2021-01-05 DIAGNOSIS — F419 Anxiety disorder, unspecified: Secondary | ICD-10-CM | POA: Insufficient documentation

## 2021-01-05 DIAGNOSIS — O99340 Other mental disorders complicating pregnancy, unspecified trimester: Secondary | ICD-10-CM | POA: Insufficient documentation

## 2021-01-05 DIAGNOSIS — Z3480 Encounter for supervision of other normal pregnancy, unspecified trimester: Secondary | ICD-10-CM | POA: Diagnosis present

## 2021-01-05 DIAGNOSIS — O43193 Other malformation of placenta, third trimester: Secondary | ICD-10-CM

## 2021-01-05 DIAGNOSIS — O99213 Obesity complicating pregnancy, third trimester: Secondary | ICD-10-CM

## 2021-01-05 DIAGNOSIS — Z720 Tobacco use: Secondary | ICD-10-CM

## 2021-01-05 DIAGNOSIS — Z3A36 36 weeks gestation of pregnancy: Secondary | ICD-10-CM

## 2021-01-05 DIAGNOSIS — O99333 Smoking (tobacco) complicating pregnancy, third trimester: Secondary | ICD-10-CM | POA: Diagnosis not present

## 2021-01-05 DIAGNOSIS — Z8616 Personal history of COVID-19: Secondary | ICD-10-CM

## 2021-01-05 LAB — GC/CHLAMYDIA PROBE AMP (~~LOC~~) NOT AT ARMC
Chlamydia: NEGATIVE
Comment: NEGATIVE
Comment: NORMAL
Neisseria Gonorrhea: NEGATIVE

## 2021-01-05 NOTE — Procedures (Signed)
Shannon Ruiz 1996/06/03 [redacted]w[redacted]d  Fetus A Non-Stress Test Interpretation for 01/05/21  Indication: IUGR  Fetal Heart Rate A Mode: External Baseline Rate (A): 130 bpm Variability: Moderate Accelerations: 15 x 15 Decelerations: None Multiple birth?: No  Uterine Activity Mode: Palpation,Toco Contraction Frequency (min): None Resting Tone Palpated: Relaxed Resting Time: Adequate  Interpretation (Fetal Testing) Nonstress Test Interpretation: Reactive Comments: Dr. Judeth Cornfield reviewed tracing.

## 2021-01-06 ENCOUNTER — Other Ambulatory Visit (HOSPITAL_COMMUNITY): Payer: Medicaid Other

## 2021-01-06 ENCOUNTER — Other Ambulatory Visit: Payer: Self-pay | Admitting: Advanced Practice Midwife

## 2021-01-08 ENCOUNTER — Other Ambulatory Visit: Payer: Self-pay

## 2021-01-08 ENCOUNTER — Encounter (HOSPITAL_COMMUNITY): Payer: Self-pay | Admitting: Family Medicine

## 2021-01-08 ENCOUNTER — Inpatient Hospital Stay (HOSPITAL_COMMUNITY): Payer: Medicaid Other

## 2021-01-08 ENCOUNTER — Inpatient Hospital Stay (HOSPITAL_COMMUNITY)
Admission: AD | Admit: 2021-01-08 | Discharge: 2021-01-12 | DRG: 798 | Disposition: A | Discharge status: Home / Self Care | Payer: Medicaid Other | Attending: Obstetrics & Gynecology | Admitting: Obstetrics & Gynecology

## 2021-01-08 DIAGNOSIS — O9902 Anemia complicating childbirth: Secondary | ICD-10-CM | POA: Diagnosis present

## 2021-01-08 DIAGNOSIS — O99891 Other specified diseases and conditions complicating pregnancy: Secondary | ICD-10-CM

## 2021-01-08 DIAGNOSIS — Z3480 Encounter for supervision of other normal pregnancy, unspecified trimester: Secondary | ICD-10-CM

## 2021-01-08 DIAGNOSIS — O99284 Endocrine, nutritional and metabolic diseases complicating childbirth: Secondary | ICD-10-CM | POA: Diagnosis present

## 2021-01-08 DIAGNOSIS — O133 Gestational [pregnancy-induced] hypertension without significant proteinuria, third trimester: Secondary | ICD-10-CM

## 2021-01-08 DIAGNOSIS — Z3A37 37 weeks gestation of pregnancy: Secondary | ICD-10-CM

## 2021-01-08 DIAGNOSIS — D508 Other iron deficiency anemias: Secondary | ICD-10-CM | POA: Diagnosis present

## 2021-01-08 DIAGNOSIS — O43123 Velamentous insertion of umbilical cord, third trimester: Secondary | ICD-10-CM | POA: Diagnosis present

## 2021-01-08 DIAGNOSIS — F419 Anxiety disorder, unspecified: Secondary | ICD-10-CM | POA: Diagnosis present

## 2021-01-08 DIAGNOSIS — Z87891 Personal history of nicotine dependence: Secondary | ICD-10-CM | POA: Diagnosis present

## 2021-01-08 DIAGNOSIS — O358XX0 Maternal care for other (suspected) fetal abnormality and damage, not applicable or unspecified: Secondary | ICD-10-CM | POA: Diagnosis present

## 2021-01-08 DIAGNOSIS — O99343 Other mental disorders complicating pregnancy, third trimester: Secondary | ICD-10-CM

## 2021-01-08 DIAGNOSIS — O365930 Maternal care for other known or suspected poor fetal growth, third trimester, not applicable or unspecified: Secondary | ICD-10-CM | POA: Diagnosis present

## 2021-01-08 DIAGNOSIS — O1404 Mild to moderate pre-eclampsia, complicating childbirth: Principal | ICD-10-CM | POA: Diagnosis present

## 2021-01-08 DIAGNOSIS — O139 Gestational [pregnancy-induced] hypertension without significant proteinuria, unspecified trimester: Secondary | ICD-10-CM | POA: Diagnosis present

## 2021-01-08 DIAGNOSIS — E876 Hypokalemia: Secondary | ICD-10-CM | POA: Diagnosis present

## 2021-01-08 DIAGNOSIS — U071 COVID-19: Secondary | ICD-10-CM | POA: Diagnosis present

## 2021-01-08 DIAGNOSIS — O99340 Other mental disorders complicating pregnancy, unspecified trimester: Secondary | ICD-10-CM | POA: Diagnosis present

## 2021-01-08 DIAGNOSIS — Z8616 Personal history of COVID-19: Secondary | ICD-10-CM

## 2021-01-08 DIAGNOSIS — Z2839 Other underimmunization status: Secondary | ICD-10-CM

## 2021-01-08 DIAGNOSIS — O98513 Other viral diseases complicating pregnancy, third trimester: Secondary | ICD-10-CM | POA: Diagnosis present

## 2021-01-08 DIAGNOSIS — O328XX0 Maternal care for other malpresentation of fetus, not applicable or unspecified: Secondary | ICD-10-CM | POA: Diagnosis not present

## 2021-01-08 DIAGNOSIS — D509 Iron deficiency anemia, unspecified: Secondary | ICD-10-CM

## 2021-01-08 DIAGNOSIS — O99013 Anemia complicating pregnancy, third trimester: Secondary | ICD-10-CM

## 2021-01-08 DIAGNOSIS — Z3490 Encounter for supervision of normal pregnancy, unspecified, unspecified trimester: Secondary | ICD-10-CM | POA: Diagnosis present

## 2021-01-08 DIAGNOSIS — Z283 Underimmunization status: Secondary | ICD-10-CM

## 2021-01-08 DIAGNOSIS — F17210 Nicotine dependence, cigarettes, uncomplicated: Secondary | ICD-10-CM | POA: Diagnosis present

## 2021-01-08 DIAGNOSIS — O43199 Other malformation of placenta, unspecified trimester: Secondary | ICD-10-CM | POA: Diagnosis present

## 2021-01-08 DIAGNOSIS — O09899 Supervision of other high risk pregnancies, unspecified trimester: Secondary | ICD-10-CM

## 2021-01-08 DIAGNOSIS — O365990 Maternal care for other known or suspected poor fetal growth, unspecified trimester, not applicable or unspecified: Secondary | ICD-10-CM | POA: Diagnosis present

## 2021-01-08 DIAGNOSIS — O35BXX0 Maternal care for other (suspected) fetal abnormality and damage, fetal cardiac anomalies, not applicable or unspecified: Secondary | ICD-10-CM | POA: Diagnosis present

## 2021-01-08 LAB — COMPREHENSIVE METABOLIC PANEL
ALT: 9 U/L (ref 0–44)
AST: 12 U/L — ABNORMAL LOW (ref 15–41)
Albumin: 2.5 g/dL — ABNORMAL LOW (ref 3.5–5.0)
Alkaline Phosphatase: 98 U/L (ref 38–126)
Anion gap: 10 (ref 5–15)
BUN: <5 mg/dL — ABNORMAL LOW (ref 6–20)
CO2: 18 mmol/L — ABNORMAL LOW (ref 22–32)
Calcium: 8.5 mg/dL — ABNORMAL LOW (ref 8.9–10.3)
Chloride: 109 mmol/L (ref 98–111)
Creatinine, Ser: 0.46 mg/dL (ref 0.44–1.00)
GFR, Estimated: >60 mL/min (ref >60)
Glucose, Bld: 103 mg/dL — ABNORMAL HIGH (ref 70–99)
Potassium: 2.5 mmol/L — CL (ref 3.5–5.1)
Sodium: 137 mmol/L (ref 135–145)
Total Bilirubin: 0.3 mg/dL (ref 0.3–1.2)
Total Protein: 5.9 g/dL — ABNORMAL LOW (ref 6.5–8.1)

## 2021-01-08 LAB — CBC
HCT: 31.4 % — ABNORMAL LOW (ref 36.0–46.0)
HCT: 31.7 % — ABNORMAL LOW (ref 36.0–46.0)
Hemoglobin: 10.3 g/dL — ABNORMAL LOW (ref 12.0–15.0)
Hemoglobin: 10.5 g/dL — ABNORMAL LOW (ref 12.0–15.0)
MCH: 29.2 pg (ref 26.0–34.0)
MCH: 29.2 pg (ref 26.0–34.0)
MCHC: 32.8 g/dL (ref 30.0–36.0)
MCHC: 33.1 g/dL (ref 30.0–36.0)
MCV: 89 fL (ref 80.0–100.0)
MCV: 88.1 fL (ref 80.0–100.0)
Platelets: 413 10*3/uL — ABNORMAL HIGH (ref 150–400)
Platelets: 435 10*3/uL — ABNORMAL HIGH (ref 150–400)
RBC: 3.53 MIL/uL — ABNORMAL LOW (ref 3.87–5.11)
RBC: 3.6 MIL/uL — ABNORMAL LOW (ref 3.87–5.11)
RDW: 14.2 % (ref 11.5–15.5)
RDW: 14.1 % (ref 11.5–15.5)
WBC: 18.3 10*3/uL — ABNORMAL HIGH (ref 4.0–10.5)
WBC: 20.5 10*3/uL — ABNORMAL HIGH (ref 4.0–10.5)
nRBC: 0 % (ref 0.0–0.2)
nRBC: 0 % (ref 0.0–0.2)

## 2021-01-08 LAB — RPR: RPR Ser Ql: NONREACTIVE

## 2021-01-08 LAB — CULTURE, BETA STREP (GROUP B ONLY): Strep Gp B Culture: NEGATIVE

## 2021-01-08 LAB — TYPE AND SCREEN
ABO/RH(D): A POS
Antibody Screen: NEGATIVE

## 2021-01-08 LAB — PROTEIN / CREATININE RATIO, URINE
Creatinine, Urine: 43.83 mg/dL
Protein Creatinine Ratio: 0.21 mg/mg{Cre} — ABNORMAL HIGH (ref 0.00–0.15)
Total Protein, Urine: 9 mg/dL

## 2021-01-08 MED ORDER — MISOPROSTOL 50MCG HALF TABLET
50.0000 ug | ORAL_TABLET | Freq: Once | ORAL | Status: AC
  Administered 2021-01-08: 50 ug via BUCCAL
  Filled 2021-01-08: qty 1

## 2021-01-08 MED ORDER — POTASSIUM CHLORIDE 20 MEQ PO PACK
40.0000 meq | PACK | Freq: Two times a day (BID) | ORAL | Status: AC
  Administered 2021-01-08 – 2021-01-09 (×2): 40 meq via ORAL
  Filled 2021-01-08 (×2): qty 2

## 2021-01-08 MED ORDER — OXYTOCIN-SODIUM CHLORIDE 30-0.9 UT/500ML-% IV SOLN
2.5000 [IU]/h | INTRAVENOUS | Status: DC
  Administered 2021-01-10: 2.5 [IU]/h via INTRAVENOUS

## 2021-01-08 MED ORDER — FENTANYL-BUPIVACAINE-NACL 0.5-0.125-0.9 MG/250ML-% EP SOLN
12.0000 mL/h | EPIDURAL | Status: DC | PRN
Start: 2021-01-08 — End: 2021-01-10
  Filled 2021-01-08: qty 250

## 2021-01-08 MED ORDER — OXYTOCIN BOLUS FROM INFUSION
333.0000 mL | Freq: Once | INTRAVENOUS | Status: AC
  Administered 2021-01-10: 333 mL via INTRAVENOUS

## 2021-01-08 MED ORDER — LACTATED RINGERS IV SOLN
500.0000 mL | INTRAVENOUS | Status: DC | PRN
  Administered 2021-01-10: 500 mL via INTRAVENOUS

## 2021-01-08 MED ORDER — TERBUTALINE SULFATE 1 MG/ML IJ SOLN: 0.2500 mg | Freq: Once | INTRAMUSCULAR | Status: DC | PRN

## 2021-01-08 MED ORDER — MISOPROSTOL 25 MCG QUARTER TABLET
25.0000 ug | ORAL_TABLET | ORAL | Status: DC | PRN
  Administered 2021-01-08 – 2021-01-09 (×3): 25 ug via VAGINAL
  Filled 2021-01-08 (×3): qty 1

## 2021-01-08 MED ORDER — OXYCODONE-ACETAMINOPHEN 5-325 MG PO TABS
1.0000 | ORAL_TABLET | ORAL | Status: DC | PRN
Start: 2021-01-08 — End: 2021-01-10

## 2021-01-08 MED ORDER — PHENYLEPHRINE 40 MCG/ML (10ML) SYRINGE FOR IV PUSH (FOR BLOOD PRESSURE SUPPORT)
80.0000 ug | PREFILLED_SYRINGE | INTRAVENOUS | Status: DC | PRN
  Filled 2021-01-08: qty 10

## 2021-01-08 MED ORDER — LIDOCAINE HCL (PF) 1 % IJ SOLN: 30.0000 mL | INTRAMUSCULAR | Status: DC | PRN

## 2021-01-08 MED ORDER — ACETAMINOPHEN 325 MG PO TABS: 650.0000 mg | ORAL_TABLET | ORAL | Status: DC | PRN

## 2021-01-08 MED ORDER — DIPHENHYDRAMINE HCL 50 MG/ML IJ SOLN: 12.5000 mg | INTRAMUSCULAR | Status: DC | PRN

## 2021-01-08 MED ORDER — OXYCODONE-ACETAMINOPHEN 5-325 MG PO TABS: 2.0000 | ORAL_TABLET | ORAL | Status: DC | PRN

## 2021-01-08 MED ORDER — EPHEDRINE 5 MG/ML INJ: 10.0000 mg | INTRAVENOUS | Status: DC | PRN

## 2021-01-08 MED ORDER — SOD CITRATE-CITRIC ACID 500-334 MG/5ML PO SOLN: 30.0000 mL | ORAL | Status: DC | PRN

## 2021-01-08 MED ORDER — LACTATED RINGERS IV SOLN
500.0000 mL | Freq: Once | INTRAVENOUS | Status: AC
  Administered 2021-01-09: 500 mL via INTRAVENOUS

## 2021-01-08 MED ORDER — PHENYLEPHRINE 40 MCG/ML (10ML) SYRINGE FOR IV PUSH (FOR BLOOD PRESSURE SUPPORT): 80.0000 ug | PREFILLED_SYRINGE | INTRAVENOUS | Status: DC | PRN

## 2021-01-08 MED ORDER — LACTATED RINGERS IV SOLN: INTRAVENOUS | Status: DC

## 2021-01-08 MED ORDER — ONDANSETRON HCL 4 MG/2ML IJ SOLN: 4.0000 mg | Freq: Four times a day (QID) | INTRAMUSCULAR | Status: DC | PRN

## 2021-01-08 NOTE — H&P (Signed)
OBSTETRIC ADMISSION HISTORY AND PHYSICAL  Shannon Ruiz is a 25 y.o. female G2P0010 with IUP at [redacted]w[redacted]d by L/10 presenting for IOL due to severe FGR and gHTN. She reports +FMs, No LOF, no VB, no blurry vision, headaches or peripheral edema, and RUQ pain.  She plans on bottle feeding and pumping. She is undecided regarding birth control and desires to defer plan until her postpartum appointment. She received her prenatal care at [facility]   Dating: By L/10 --->  Estimated Date of Delivery: 01/29/21  Sono:  [redacted]w[redacted]d, CWD, normal anatomy, cephalic presentation, 2742g, 08% EFW, increased S/D ratio, BPP 10/10  Prenatal History/Complications:  VSD of fetus affecting management of pregnancy, resolved on last U/S with normal fetal echo Tobacco use in pregnancy (first several months) Anxiety disorder affect pregnancy, antepartum Pregnancy affected by severe fetal growth restriction (diagnosed at 23w) Gestational HTN, third trimester Marginal insertion of umbilical cord affecting management of mother IDA 2/2 inadequate dietary iron intake (s/p IV iron infusion) COVID-19 (no monoclonal Ab, no vaccines) affecting pregnancy in 3rd trimester (diagnosed 12/23/20)  Past Medical History: Past Medical History:  Diagnosis Date  . Anxiety   . Depression     Past Surgical History: Past Surgical History:  Procedure Laterality Date  . NO PAST SURGERIES      Obstetrical History: OB History    Gravida  2   Para      Term      Preterm      AB  1   Living        SAB  1   IAB      Ectopic      Multiple      Live Births              Social History Social History   Socioeconomic History  . Marital status: Single    Spouse name: Not on file  . Number of children: Not on file  . Years of education: Not on file  . Highest education level: Not on file  Occupational History  . Not on file  Tobacco Use  . Smoking status: Never Smoker  . Smokeless tobacco: Never Used  Vaping Use  .  Vaping Use: Never used  Substance and Sexual Activity  . Alcohol use: Never  . Drug use: Never  . Sexual activity: Not Currently    Birth control/protection: None  Other Topics Concern  . Not on file  Social History Narrative  . Not on file   Social Determinants of Health   Financial Resource Strain: Not on file  Food Insecurity: Not on file  Transportation Needs: Not on file  Physical Activity: Not on file  Stress: Not on file  Social Connections: Not on file    Family History: Family History  Problem Relation Age of Onset  . Anxiety disorder Mother   . Hypertension Mother   . Heart Problems Mother   . Diabetes Father     Allergies: No Known Allergies  Medications Prior to Admission  Medication Sig Dispense Refill Last Dose  . ondansetron (ZOFRAN) 4 MG tablet Take 1 tablet (4 mg total) by mouth every 8 (eight) hours as needed for nausea or vomiting. 42 tablet 3 Past Month at Unknown time  . promethazine (PHENERGAN) 25 MG tablet Take 1 tablet (25 mg total) by mouth every 6 (six) hours as needed for nausea or vomiting. 30 tablet 0 Past Month at Unknown time  . ferrous gluconate (FERGON) 324 MG tablet Take  1 tablet (324 mg total) by mouth daily with breakfast. (Patient not taking: Reported on 01/04/2021) 90 tablet 3   . hydrOXYzine (VISTARIL) 25 MG capsule Take 1 capsule (25 mg total) by mouth 3 (three) times daily as needed for anxiety. 30 capsule 0   . potassium chloride SA (KLOR-CON) 20 MEQ tablet Take 1 tablet (20 mEq total) by mouth 2 (two) times daily for 7 days. 14 tablet 0   . Prenatal Vit-Fe Fum-Fe Bisg-FA (NATACHEW) 28-1 MG CHEW Chew 1 tablet by mouth daily. 30 tablet 12 Unknown at Unknown time     Review of Systems   All systems reviewed and negative except as stated in HPI  Temperature 98.5 F (36.9 C), temperature source Oral, last menstrual period 04/24/2020. General appearance: alert, cooperative and no distress Lungs: normal WOB Heart: regular  rate Abdomen: soft, non-tender Extremities:  no sign of DVT Presentation: cephalic by bedside ultrasound Fetal monitoringBaseline: 135 bpm, Variability: Good {> 6 bpm), Accelerations: Reactive and Decelerations: Absent Uterine activity: no contractions on toco    Prenatal labs: ABO, Rh: A/Positive/-- (07/06 1127) Antibody: Negative (07/06 1127) Rubella: <0.90 (07/06 1127) RPR: Non Reactive (11/05 0948)  HBsAg: Negative (07/06 1127)  HIV: Non Reactive (11/05 0948)  GBS: Negative/-- (01/10 1059)  2 hr Glucola wnl Genetic screening  wnl Anatomy US wnl (fetal growth restriction later diagnosed at 23w)  Prenatal Transfer Tool  Maternal Diabetes: No Genetic Screening: Normal Maternal Ultrasounds/Referrals: IUGR and VSD (resolved) Fetal Ultrasounds or other Referrals:  Fetal echo wnl, MFM referral for severe FGR Maternal Substance Abuse:  Yes:  Type: Smoker for the first 2-3 months of pregnancy Significant Maternal Medications:  None Significant Maternal Lab Results: Group B Strep negative  No results found for this or any previous visit (from the past 24 hour(s)).  Patient Active Problem List   Diagnosis Date Noted  . Encounter for induction of labor 01/08/2021  . COVID-19 affecting pregnancy in third trimester 12/31/2020  . Iron deficiency anemia secondary to inadequate dietary iron intake 12/17/2020  . Marginal insertion of umbilical cord affecting management of mother 12/17/2020  . Gestational hypertension 12/13/2020  . Pregnancy affected by fetal growth restriction 12/03/2020  . Ventricular septal defect (VSD) of fetus affecting management of pregnancy 10/30/2020  . Supervision of other normal pregnancy, antepartum 06/30/2020  . Cigarette smoker 06/30/2020  . Anxiety disorder affecting pregnancy, antepartum 06/30/2020    Assessment/Plan:  Shannon Ruiz is a 25 y.o. G2P0010 at [redacted]w[redacted]d here for IOL secondary to severe FGR.  #Labor: Declined FB on admission. Will start with  cytotec and continue as clinically indicated. #Pain: pt considering IV pain meds and epidural [name]:  Category 1 strip #ID: GBS negative #MOF: bottle and pump #MOC: undecided #Circ: n/a [name]: mild range blood pressure on admission. Asymptomatic. Will f/u preeclampsia labs. [name]: recommendation for IOL at 37w per MFM as noted above. [name]: encouraged continued cessation on admission. #Anxiety: no current medications. Plan for 1 week mood check. #COVID Infection in pregnancy: diagnosed 12/23/20. Pt asymptomatic on admission. [name]: S/p IV iron in prenatal period. Hgb 10.3 on admission. Will replete in postpartum period as clinically indicated.  Sheila Oats, MD OB Fellow, Faculty [facility] 01/08/2021 1:26 PM

## 2021-01-08 NOTE — Progress Notes (Signed)
Patient ID: Shannon Ruiz, female   DOB: 11/15/1996, 25 y.o.   MRN: [id number]  S/p cytotec x 2 doses; would like to defer a cervical foley until the morning at least and then reeval; it is her preference to repeat the cytotec dosing during the night   BP 131/68, P 86 FHR 130-140s, +accels, no decels Ctx irreg/mild Cx deferred  IUP@37 .0wks gHTN Severe FGR Cx unfavorable (from last exam)  Will repeat cytotec buccal  [name] [facility] 01/08/2021

## 2021-01-08 NOTE — Progress Notes (Signed)
Labor Progress Note Shannon Ruiz is a 25 y.o. G2P0010 at [redacted]w[redacted]d presented for IOL secondary to severe FGR in the setting of gHTN.  S: Pt resting comfortably. No concerns at this time.  O:  BP 121/65   Pulse 85   Temp 98.2 F (36.8 C) (Oral)   Resp 18   LMP 04/24/2020 (Exact Date)   SpO2 99%  EFM: baseline 125/moderate variability/+accels/no decels Toco: irregular  CVE: Dilation: 1 Effacement (%): Thick Station: -3 Presentation: Vertex Exam by:: Lynnda Shields MD   A&P: 25 y.o. G2P0010 [redacted]w[redacted]d presented for IOL secondary to severe FGR in the setting of gHTN. #Labor: S/p cytotec x2. Pt continues to decline FB s/p counseling. Will plan to recheck cervical exam in 4 hours or sooner as clinically indicated. #Pain: TBD per pt preference [name]: Category 1 strip. [name] negative [name]: Most recent BP wnl. Pt continues to be asymptomatic. CMP notable for K 2.5 (s/p repletion on admission) but otherwise unremarkable. Will f/u UP:C ratio. [name]: recommendation for IOL at 37w per MFM as noted above. [name]: encouraged continued cessation on admission. #Anxiety: no current medications. Plan for 1 week mood check. [name]: diagnosed 12/23/20. Pt asymptomatic on admission. [name]: S/p IV iron in prenatal period. Hgb 10.3 on admission. Will replete in postpartum period as clinically indicated.  Sheila Oats, MD 2:18 PM

## 2021-01-08 NOTE — Plan of Care (Signed)
  Problem: Education: Goal: Knowledge of General Education information will improve Description: Including pain rating scale, medication(s)/side effects and non-pharmacologic comfort measures Outcome: Progressing   Problem: Pain Managment: Goal: General experience of comfort will improve Outcome: Progressing   Problem: Education: Goal: Knowledge of Childbirth will improve Outcome: Progressing   Pt walking in room, s/p second dose of cytotec. Planning on epidural in active labor. No questions at this time

## 2021-01-09 ENCOUNTER — Inpatient Hospital Stay (HOSPITAL_COMMUNITY): Payer: Medicaid Other | Admitting: Anesthesiology

## 2021-01-09 DIAGNOSIS — O09899 Supervision of other high risk pregnancies, unspecified trimester: Secondary | ICD-10-CM

## 2021-01-09 DIAGNOSIS — Z283 Underimmunization status: Secondary | ICD-10-CM

## 2021-01-09 DIAGNOSIS — Z2839 Other underimmunization status: Secondary | ICD-10-CM

## 2021-01-09 DIAGNOSIS — O328XX0 Maternal care for other malpresentation of fetus, not applicable or unspecified: Secondary | ICD-10-CM

## 2021-01-09 DIAGNOSIS — O99891 Other specified diseases and conditions complicating pregnancy: Secondary | ICD-10-CM

## 2021-01-09 HISTORY — DX: Supervision of other high risk pregnancies, unspecified trimester: O09.899

## 2021-01-09 LAB — COMPREHENSIVE METABOLIC PANEL
ALT: 8 U/L (ref 0–44)
AST: 10 U/L — ABNORMAL LOW (ref 15–41)
Albumin: 2.7 g/dL — ABNORMAL LOW (ref 3.5–5.0)
Alkaline Phosphatase: 119 U/L (ref 38–126)
Anion gap: 9 (ref 5–15)
BUN: <5 mg/dL — ABNORMAL LOW (ref 6–20)
CO2: 17 mmol/L — ABNORMAL LOW (ref 22–32)
Calcium: 8.9 mg/dL (ref 8.9–10.3)
Chloride: 110 mmol/L (ref 98–111)
Creatinine, Ser: 0.41 mg/dL — ABNORMAL LOW (ref 0.44–1.00)
GFR, Estimated: >60 mL/min (ref >60)
Glucose, Bld: 89 mg/dL (ref 70–99)
Potassium: 3.7 mmol/L (ref 3.5–5.1)
Sodium: 136 mmol/L (ref 135–145)
Total Bilirubin: 0.4 mg/dL (ref 0.3–1.2)
Total Protein: 6.2 g/dL — ABNORMAL LOW (ref 6.5–8.1)

## 2021-01-09 LAB — CBC
HCT: 32.1 % — ABNORMAL LOW (ref 36.0–46.0)
Hemoglobin: 10.6 g/dL — ABNORMAL LOW (ref 12.0–15.0)
MCH: 29.2 pg (ref 26.0–34.0)
MCHC: 33 g/dL (ref 30.0–36.0)
MCV: 88.4 fL (ref 80.0–100.0)
Platelets: 411 10*3/uL — ABNORMAL HIGH (ref 150–400)
RBC: 3.63 MIL/uL — ABNORMAL LOW (ref 3.87–5.11)
RDW: 14.1 % (ref 11.5–15.5)
WBC: 21.6 10*3/uL — ABNORMAL HIGH (ref 4.0–10.5)
nRBC: 0 % (ref 0.0–0.2)

## 2021-01-09 MED ORDER — SODIUM CHLORIDE (PF) 0.9 % IJ SOLN
INTRAMUSCULAR | Status: DC | PRN
  Administered 2021-01-09: 12 mL/h via EPIDURAL

## 2021-01-09 MED ORDER — POTASSIUM CHLORIDE 20 MEQ PO PACK
40.0000 meq | PACK | ORAL | Status: AC
  Administered 2021-01-09 (×3): 40 meq via ORAL
  Filled 2021-01-09 (×3): qty 2

## 2021-01-09 MED ORDER — TERBUTALINE SULFATE 1 MG/ML IJ SOLN: 0.2500 mg | Freq: Once | INTRAMUSCULAR | Status: DC | PRN

## 2021-01-09 MED ORDER — OXYTOCIN-SODIUM CHLORIDE 30-0.9 UT/500ML-% IV SOLN
1.0000 m[IU]/min | INTRAVENOUS | Status: DC
  Administered 2021-01-09: 2 m[IU]/min via INTRAVENOUS
  Filled 2021-01-09: qty 500

## 2021-01-09 MED ORDER — MISOPROSTOL 50MCG HALF TABLET
50.0000 ug | ORAL_TABLET | Freq: Once | ORAL | Status: AC
  Administered 2021-01-09: 50 ug via BUCCAL
  Filled 2021-01-09: qty 1

## 2021-01-09 MED ORDER — LIDOCAINE HCL (PF) 1 % IJ SOLN
INTRAMUSCULAR | Status: DC | PRN
  Administered 2021-01-09 (×2): 4 mL via EPIDURAL

## 2021-01-09 MED ORDER — ONDANSETRON 4 MG PO TBDP
4.0000 mg | ORAL_TABLET | Freq: Three times a day (TID) | ORAL | Status: DC | PRN
  Administered 2021-01-09 – 2021-01-10 (×2): 4 mg via ORAL
  Filled 2021-01-09 (×2): qty 1

## 2021-01-09 NOTE — Progress Notes (Signed)
Shannon Ruiz is a 25 y.o. G2P0010 at [redacted]w[redacted]d by LMP admitted for induction of labor due to severe FGR (EFW 4% at 23 weeks) with elevated S/D ratio, and gHTN.  Subjective: Patient reports increased pain with contractions. Patient express anxiety with labor process and delivery.   Objective: BP 130/65   Pulse 84   Temp 98.2 F (36.8 C) (Oral)   Resp 18   LMP 04/24/2020 (Exact Date)   SpO2 99%  No intake/output data recorded. No intake/output data recorded.  FHT:  FHR: 130 bpm, variability: moderate,  accelerations:  Present,  decelerations:  Absent UC:   regular, every 2-4 minutes SVE:   Dilation: 2 Effacement (%): 50 Station: -3 Exam by:: Shannon Ruiz, SNM  Labs: Lab Results  Component Value Date   WBC 20.5 (H) 01/08/2021   HGB 10.5 (L) 01/08/2021   HCT 31.7 (L) 01/08/2021   MCV 88.1 01/08/2021   PLT 435 (H) 01/08/2021    Assessment / Plan: IOL due to severe FGR (EFW 4% at 23 weeks) with elevated S/D ratio, and gHTN.  Labor: Progressing normally and patient does not desire foley bulb placement, requests to continue with cytotec only.  Preeclampsia:  labs stable Fetal Wellbeing:  Category I Pain Control:  Epidural I/D:  GBS Negative Anticipated MOD:  NSVD  Shannon Ruiz 01/09/2021, 1:11 AM

## 2021-01-09 NOTE — Progress Notes (Addendum)
Labor Progress Note Shannon Ruiz is a 25 y.o. G2P0010 at [redacted]w[redacted]d presented for IOL-FGR (4%ile, elevated S/D). S: Doing well without complaints.  O:  BP 132/83   Pulse 78   Temp 98 F (36.7 C) (Oral)   Resp 18   LMP 04/24/2020 (Exact Date)   SpO2 99%  EFM: baseline 130bpm/mod variability/+ accels/no decels Toco: irritable  CVE: Dilation: 1 Effacement (%): 20 Station: -1 Presentation: Vertex Exam by:: Germaine Pomfret MD   A&P: 25 y.o. G2P0010 [redacted]w[redacted]d presented for IOL-FGR. #IOL: s/p cyto x5. Given cervical exam will re-dose cytotec at this time. Counseled patient extensively on FB placement risks/benefits. Patient declines at this time. #Pain: PRN, desires epidural [name]: cat 1 [name] negative  [name]: asymptomatic. preE labs nml. BP well controlled. #anxiety: vistaril PRN, not on daily meds. Stable. SW postpartum.  Alric Seton, MD 8:59 AM

## 2021-01-09 NOTE — Anesthesia Procedure Notes (Signed)
Epidural Patient location during procedure: OB Start time: 01/09/2021 7:59 PM End time: 01/09/2021 8:02 PM  Staffing Anesthesiologist: Kaylyn Layer, MD Performed: anesthesiologist   Preanesthetic Checklist Completed: patient identified, IV checked, risks and benefits discussed, monitors and equipment checked, pre-op evaluation and timeout performed  Epidural Patient position: sitting Prep: DuraPrep and site prepped and draped Patient monitoring: continuous pulse ox, blood pressure and heart rate Approach: midline Location: L3-L4 Injection technique: LOR air  Needle:  Needle type: Tuohy  Needle gauge: 17 G Needle length: 9 cm Needle insertion depth: 5 cm Catheter type: closed end flexible Catheter size: 19 Gauge Catheter at skin depth: 10 cm Test dose: negative and Other (1% lidocaine)  Assessment Events: blood not aspirated, injection not painful, no injection resistance, no paresthesia and negative IV test  Additional Notes Patient identified. Risks, benefits, and alternatives discussed with patient including but not limited to bleeding, infection, nerve damage, paralysis, failed block, incomplete pain control, headache, blood pressure changes, nausea, vomiting, reactions to medication, itching, and postpartum back pain. Confirmed with bedside nurse the patient's most recent platelet count. Confirmed with patient that they are not currently taking any anticoagulation, have any bleeding history, or any family history of bleeding disorders. Patient expressed understanding and wished to proceed. All questions were answered. Sterile technique was used throughout the entire procedure. Please see nursing notes for vital signs.   Crisp LOR on first pass. Test dose was given through epidural catheter and negative prior to continuing to dose epidural or start infusion. Warning signs of high block given to the patient including shortness of breath, tingling/numbness in hands, complete  motor block, or any concerning symptoms with instructions to call for help. Patient was given instructions on fall risk and not to get out of bed. All questions and concerns addressed with instructions to call with any issues or inadequate analgesia.  Reason for block:procedure for pain

## 2021-01-09 NOTE — Anesthesia Preprocedure Evaluation (Signed)
Anesthesia Evaluation  Patient identified by MRN, date of birth, ID band Patient awake    Reviewed: Allergy & Precautions, Patient's Chart, lab work & pertinent test results  History of Anesthesia Complications Negative for: history of anesthetic complications  Airway Mallampati: II  TM Distance: >3 FB Neck ROM: Full    Dental no notable dental hx.    Pulmonary neg pulmonary ROS,    Pulmonary exam normal        Cardiovascular negative cardio ROS Normal cardiovascular exam     Neuro/Psych Anxiety Depression negative neurological ROS     GI/Hepatic negative GI ROS, Neg liver ROS,   Endo/Other  negative endocrine ROS  Renal/GU negative Renal ROS  negative genitourinary   Musculoskeletal negative musculoskeletal ROS (+)   Abdominal   Peds  Hematology  (+) anemia ,   Anesthesia Other Findings Day of surgery medications reviewed with patient.  Reproductive/Obstetrics (+) Pregnancy (preE on Mg)                             Anesthesia Physical Anesthesia Plan  ASA: III  Anesthesia Plan: Epidural   Post-op Pain Management:    Induction:   PONV Risk Score and Plan: Treatment may vary due to age or medical condition  Airway Management Planned: Natural Airway  Additional Equipment:   Intra-op Plan:   Post-operative Plan:   Informed Consent: I have reviewed the patients History and Physical, chart, labs and discussed the procedure including the risks, benefits and alternatives for the proposed anesthesia with the patient or authorized representative who has indicated his/her understanding and acceptance.       Plan Discussed with:   Anesthesia Plan Comments:         Anesthesia Quick Evaluation

## 2021-01-09 NOTE — Progress Notes (Signed)
Labor Progress Note Shruti Arrey is a 25 y.o. G2P0010 at [redacted]w[redacted]d presented for IOL-FGR (4%ile, elevated S/D).  S: Doing well without complaints. Patient agreeable to FB placement.  O:  BP 132/71 (BP Location: Right Arm)   Pulse 83   Temp 98 F (36.7 C)   Resp 16   LMP 04/24/2020 (Exact Date)   SpO2 99%  EFM: baseline 130bpm/mod variability/+ accels/no decels Toco: irritable  CVE: Dilation: 2 Effacement (%): 70 Station: -2 Presentation: Vertex Exam by:: Germaine Pomfret MD   A&P: 25 y.o. G2P0010 [redacted]w[redacted]d presented for IOL-FGR. #IOL: s/p cyto x6.  Counseled patient extensively on FB placement, successfully performed. Will give 7th dose of cyto via buccal route now. Plan for pitocin at next check. [name]: cat 1 [name] negative [name]: asymptomatic. preE labs nml. BP well controlled. #anxiety: vistaril PRN, not on daily meds. Stable. SW postpartum.  Shirlean Mylar, MD 1:06 PM

## 2021-01-09 NOTE — Progress Notes (Signed)
Labor Progress Note Shannon Ruiz is a 25 y.o. G2P0010 at [redacted]w[redacted]d presented for IOL-FGR (4%ile, elevated S/D).  S: Strip reviewed. FB still in place, bloody show present.  O:  BP (!) 153/87   Pulse 86   Temp 98.9 F (37.2 C) (Oral)   Resp 16   LMP 04/24/2020 (Exact Date)   SpO2 99%  EFM: baseline 135bpm/mod variability/+ accels/no decels Toco: irritable  CVE: Dilation: 2 Effacement (%): 70 Station: -2 Presentation: Vertex Exam by:: Germaine Pomfret MD   A&P: 25 y.o. G2P0010 [redacted]w[redacted]d presented for IOL-FGR. #IOL: s/p cyto x7.  FB still in, however bloody show present. Plan to start low dose pitocin and hold at 6 while FB still in after shower in 15-20 mins. Can continue to titrate pitocin once FB out. [name]: cat 1 [name] negative [name]: 1 MR BP, asymptomatic. preE labs nml on admission. Continue to monitor. #anxiety: vistaril PRN, not on daily meds. Stable. SW postpartum. #hypokalemia: K2.5 last night, repleting with KlorCon x4. Will repeat BMP tonight.  Shirlean Mylar, MD 5:13 PM

## 2021-01-09 NOTE — Progress Notes (Addendum)
Patient ID: Shannon Ruiz, female   DOB: 1996/06/19, 25 y.o.   MRN: [id number]  S/p cytotec x 5 doses; not feeling much cramping now  BP 132/83, P 78 FHR 120s, +accels, no decels Ctx- irreg, mild Cx deferred (was 2/50/vtx -3 at 0340)  IUP@37 .1wks gHTN Severe FGR w abnl dopplers Cx unfavorable  Discussed having day team do a cx exam to determine next plan of care  Shannon Ruiz CNM 01/09/2021

## 2021-01-09 NOTE — Progress Notes (Signed)
LABOR PROGRESS NOTE  Shannon Ruiz is a 25 y.o. G2P0010 at [redacted]w[redacted]d  admitted for severe FGR, GHTN   Subjective: Patient doing well, comfortable with epidural at this time. Patient denies any pain with contractions   Objective: BP 130/79 (BP Location: Right Arm)   Pulse 75   Temp 98.9 F (37.2 C) (Oral)   Resp 18   LMP 04/24/2020 (Exact Date)   SpO2 99%  or  Vitals:   01/09/21 2015 01/09/21 2020 01/09/21 2025 01/09/21 2030  BP: 113/78 122/82 124/74 130/79  Pulse: 81 76 71 75  Resp: 16 16 18 18   Temp:      TempSrc:      SpO2:        FB out @ 2055, plan to check cervix after foley catheter placement  Dilation: 2 Effacement (%): 70 Station: -2 Presentation: Vertex Exam by:: [email or web address] MD FHT: baseline rate 130, moderate varibility, +accel, no decel Toco: 3-4  Labs: Lab Results  Component Value Date   WBC 21.6 (H) 01/09/2021   HGB 10.6 (L) 01/09/2021   HCT 32.1 (L) 01/09/2021   MCV 88.4 01/09/2021   PLT 411 (H) 01/09/2021    Patient Active Problem List   Diagnosis Date Noted  . Rubella non-immune status, antepartum 01/09/2021  . Encounter for induction of labor 01/08/2021  . COVID-19 affecting pregnancy in third trimester 12/31/2020  . Iron deficiency anemia secondary to inadequate dietary iron intake 12/17/2020  . Marginal insertion of umbilical cord affecting management of mother 12/17/2020  . Gestational hypertension 12/13/2020  . Pregnancy affected by fetal growth restriction 12/03/2020  . Ventricular septal defect (VSD) of fetus affecting management of pregnancy 10/30/2020  . Supervision of other normal pregnancy, antepartum 06/30/2020  . Cigarette smoker 06/30/2020  . Anxiety disorder affecting pregnancy, antepartum 06/30/2020    Assessment / Plan: 25 y.o. G2P0010 at [redacted]w[redacted]d here for IOL   Labor: FB out. Continue to titrate pitocin to active labor, plan to AROM around 6-7cm.  Fetal Wellbeing:  Cat I  Pain Control:  Epidural  Anticipated MOD:  SVD GHTN:  stable at this time, continue to monitor  Anxiety: vistaril PRN, patient in better mood after epidural, patient is now ready to have baby  [redacted]w[redacted]d, CNM 01/09/2021, 8:57 PM

## 2021-01-10 ENCOUNTER — Encounter (HOSPITAL_COMMUNITY): Payer: Self-pay | Admitting: Family Medicine

## 2021-01-10 LAB — PROTEIN / CREATININE RATIO, URINE
Creatinine, Urine: 16.54 mg/dL
Protein Creatinine Ratio: 0.6 mg/mg{Cre} — ABNORMAL HIGH (ref 0.00–0.15)
Total Protein, Urine: 10 mg/dL

## 2021-01-10 MED ORDER — IBUPROFEN 600 MG PO TABS
600.0000 mg | ORAL_TABLET | Freq: Four times a day (QID) | ORAL | Status: DC
  Administered 2021-01-10 – 2021-01-12 (×8): 600 mg via ORAL
  Filled 2021-01-10 (×8): qty 1

## 2021-01-10 MED ORDER — SODIUM CHLORIDE 0.9 % IV SOLN
2.0000 g | Freq: Four times a day (QID) | INTRAVENOUS | Status: DC
  Administered 2021-01-10: 2 g via INTRAVENOUS
  Filled 2021-01-10: qty 2000

## 2021-01-10 MED ORDER — ACETAMINOPHEN 500 MG PO TABS
1000.0000 mg | ORAL_TABLET | Freq: Once | ORAL | Status: AC
  Administered 2021-01-10: 1000 mg via ORAL
  Filled 2021-01-10: qty 2

## 2021-01-10 MED ORDER — BENZOCAINE-MENTHOL 20-0.5 % EX AERO: 1.0000 "application " | INHALATION_SPRAY | CUTANEOUS | Status: DC | PRN

## 2021-01-10 MED ORDER — ACETAMINOPHEN 325 MG PO TABS
650.0000 mg | ORAL_TABLET | ORAL | Status: DC | PRN
  Administered 2021-01-12: 650 mg via ORAL
  Filled 2021-01-10: qty 2

## 2021-01-10 MED ORDER — PRENATAL MULTIVITAMIN CH
1.0000 | ORAL_TABLET | Freq: Every day | ORAL | Status: DC
  Administered 2021-01-10 – 2021-01-11 (×2): 1 via ORAL
  Filled 2021-01-10 (×2): qty 1

## 2021-01-10 MED ORDER — COCONUT OIL OIL: 1.0000 "application " | TOPICAL_OIL | Status: DC | PRN

## 2021-01-10 MED ORDER — GENTAMICIN SULFATE 40 MG/ML IJ SOLN
5.0000 mg/kg | INTRAVENOUS | Status: DC
  Administered 2021-01-10: 350 mg via INTRAVENOUS
  Filled 2021-01-10: qty 8.75

## 2021-01-10 MED ORDER — ZOLPIDEM TARTRATE 5 MG PO TABS: 5.0000 mg | ORAL_TABLET | Freq: Every evening | ORAL | Status: DC | PRN

## 2021-01-10 MED ORDER — SIMETHICONE 80 MG PO CHEW: 80.0000 mg | CHEWABLE_TABLET | ORAL | Status: DC | PRN

## 2021-01-10 MED ORDER — ONDANSETRON HCL 4 MG PO TABS: 4.0000 mg | ORAL_TABLET | ORAL | Status: DC | PRN

## 2021-01-10 MED ORDER — ONDANSETRON HCL 4 MG/2ML IJ SOLN
4.0000 mg | INTRAMUSCULAR | Status: DC | PRN
  Filled 2021-01-10: qty 2

## 2021-01-10 MED ORDER — TETANUS-DIPHTH-ACELL PERTUSSIS 5-2.5-18.5 LF-MCG/0.5 IM SUSY: 0.5000 mL | PREFILLED_SYRINGE | Freq: Once | INTRAMUSCULAR | Status: DC

## 2021-01-10 MED ORDER — DIPHENHYDRAMINE HCL 25 MG PO CAPS: 25.0000 mg | ORAL_CAPSULE | Freq: Four times a day (QID) | ORAL | Status: DC | PRN

## 2021-01-10 MED ORDER — SENNOSIDES-DOCUSATE SODIUM 8.6-50 MG PO TABS
2.0000 | ORAL_TABLET | ORAL | Status: DC
  Administered 2021-01-11 – 2021-01-12 (×2): 2 via ORAL
  Filled 2021-01-10 (×2): qty 2

## 2021-01-10 MED ORDER — DIBUCAINE (PERIANAL) 1 % EX OINT: 1.0000 "application " | TOPICAL_OINTMENT | CUTANEOUS | Status: DC | PRN

## 2021-01-10 MED ORDER — WITCH HAZEL-GLYCERIN EX PADS: 1.0000 "application " | MEDICATED_PAD | CUTANEOUS | Status: DC | PRN

## 2021-01-10 NOTE — Social Work (Signed)
CSW received consult for hx of Anxiety and Depression.  CSW spoke with MOB to offer support and complete assessment.    CSW introduced self and role. CSW congratulated MOB on baby Haylin. CSW informed MOB of reason for consult and assessed MOB current emotions. MOB reported she is currently doing well. MOB stated she had a rough pregnancy and labor was a lot easier. CSW asked MOB about any mental health diagnosis. MOB reported she was diagnosed with anxiety in 2012. MOB stated she "kind of" experienced symptoms during pregnancy and was provided hydroxyzine PRN to treat. MOB reported she never felt the need to take the medication and still have the prescription at home. MOB reported she has not been to therapy as an adult for anxiety, only as a child. MOB identified FOB as a support and denies any current SI, HI or DV.   CSW provided education regarding the baby blues period vs. perinatal mood disorder and discussed treatment for mental health follow up if concerns arise.  CSW recommends self-evaluation during the postpartum time period using the New Mom Checklist from Postpartum Progress and encouraged MOB to contact a medical professional if symptoms are noted at any time.   CSW provided review of Sudden Infant Death Syndrome (SIDS) precautions.  MOB reported baby will sleep in a bassinet. MOB identified [facility] for follow-up care and denies any transportation barriers. MOB has everything needed for baby and expressed no additional concerns at this time.   CSW identifies no further need for intervention and no barriers to discharge at this time.  Shadrach Bartunek, MSW, LCSWA Clinical Social Work [facility] [phone] 

## 2021-01-10 NOTE — Progress Notes (Signed)
LABOR PROGRESS NOTE  Shannon Ruiz is a 25 y.o. G2P0010 at [redacted]w[redacted]d  admitted for IOL for FGR and GHTN   Subjective: Patient doing well, comfortable with epidural. Patient wants AROM but is slightly anxious about process. Educated and discussed process with patient, support given. Patient agrees to AROM at this time.   Objective: BP 135/84 (BP Location: Right Arm)   Pulse 86   Temp 98.6 F (37 C) (Oral)   Resp 18   LMP 04/24/2020 (Exact Date)   SpO2 99%  or  Vitals:   01/10/21 0030 01/10/21 0054 01/10/21 0100 01/10/21 0130  BP: 139/85  130/65 135/84  Pulse: 88  84 86  Resp: 18  18 18   Temp:  98.6 F (37 C)    TempSrc:  Oral    SpO2:        AROM @ 0029, clear fluid Dilation: 5 Effacement (%): 80 Station: -2,-1 Presentation: Vertex Exam by:: [email or web address], CNM FHT: baseline rate 140, moderate varibility, +accel, no decel Toco: UI   Labs: Lab Results  Component Value Date   WBC 21.6 (H) 01/09/2021   HGB 10.6 (L) 01/09/2021   HCT 32.1 (L) 01/09/2021   MCV 88.4 01/09/2021   PLT 411 (H) 01/09/2021    Patient Active Problem List   Diagnosis Date Noted  . Rubella non-immune status, antepartum 01/09/2021  . Encounter for induction of labor 01/08/2021  . COVID-19 affecting pregnancy in third trimester 12/31/2020  . Iron deficiency anemia secondary to inadequate dietary iron intake 12/17/2020  . Marginal insertion of umbilical cord affecting management of mother 12/17/2020  . Gestational hypertension 12/13/2020  . Pregnancy affected by fetal growth restriction 12/03/2020  . Ventricular septal defect (VSD) of fetus affecting management of pregnancy 10/30/2020  . Supervision of other normal pregnancy, antepartum 06/30/2020  . Cigarette smoker 06/30/2020  . Anxiety disorder affecting pregnancy, antepartum 06/30/2020    Assessment / Plan: 25 y.o. G2P0010 at [redacted]w[redacted]d here for IOL   Labor: AROM performed - clear fluid, IUPC placed at this time. Continue to titrate pitocin to active  labor  Fetal Wellbeing:  Cat I  Pain Control:  Epidural  Anticipated MOD:  SVD  [redacted]w[redacted]d, CNM 01/10/2021, 2:15 AM

## 2021-01-10 NOTE — Progress Notes (Signed)
ANTIBIOTIC CONSULT NOTE - INITIAL  Pharmacy Consult for Gentamicin Indication: Chorioamnionitis   No Known Allergies  Patient Measurements:   Adjusted Body Weight: 69.7 kg  Vital Signs: Temp: 98.9 F (37.2 C) (01/16 0518) Temp Source: Axillary (01/16 0518) BP: 123/78 (01/16 0530) Pulse Rate: 91 (01/16 0530)  Labs: Recent Labs    01/08/21 0826 01/08/21 1506 01/08/21 2228 01/09/21 1832 01/10/21 0235  WBC 18.3*  --  20.5* 21.6*  --   HGB 10.3*  --  10.5* 10.6*  --   PLT 413*  --  435* 411*  --   LABCREA  --  43.83  --   --  16.54  CREATININE 0.46  --   --  0.41*  --    No results for input(s): GENTTROUGH, GENTPEAK, GENTRANDOM in the last 72 hours.   Microbiology: Recent Results (from the past 720 hour(s))  Resp Panel by RT-PCR (Flu A&B, Covid) Nasopharyngeal Swab     Status: Abnormal   Collection Time: 12/23/20  5:27 PM   Specimen: Nasopharyngeal Swab; Nasopharyngeal(NP) swabs in vial transport medium  Result Value Ref Range Status   SARS Coronavirus 2 by RT PCR POSITIVE (A) NEGATIVE Final    Comment: RESULT CALLED TO, READ BACK BY AND VERIFIED WITH: A SADANO RN 12/23/20 AT 1933 SK (NOTE) SARS-CoV-2 target nucleic acids are DETECTED.  The SARS-CoV-2 RNA is generally detectable in upper respiratory specimens during the acute phase of infection. Positive results are indicative of the presence of the identified virus, but do not rule out bacterial infection or co-infection with other pathogens not detected by the test. Clinical correlation with patient history and other diagnostic information is necessary to determine patient infection status. The expected result is Negative.  Fact Sheet for Patients: BloggerCourse.com  Fact Sheet for Healthcare Providers: SeriousBroker.it  This test is not yet approved or cleared by the [place] FDA and  has been authorized for detection and/or diagnosis of SARS-CoV-2  by FDA under an Emergency Use Authorization (EUA).  This EUA will remain in effect (meaning this test can be u sed) for the duration of  the COVID-19 declaration under Section 564(b)(1) of the Act, 21 U.S.C. section 360bbb-3(b)(1), unless the authorization is terminated or revoked sooner.     Influenza A by PCR NEGATIVE NEGATIVE Final   Influenza B by PCR NEGATIVE NEGATIVE Final    Comment: (NOTE) The Xpert Xpress SARS-CoV-2/FLU/RSV plus assay is intended as an aid in the diagnosis of influenza from Nasopharyngeal swab specimens and should not be used as a sole basis for treatment. Nasal washings and aspirates are unacceptable for Xpert Xpress SARS-CoV-2/FLU/RSV testing.  Fact Sheet for Patients: BloggerCourse.com  Fact Sheet for Healthcare Providers: SeriousBroker.it  This test is not yet approved or cleared by the [place] FDA and has been authorized for detection and/or diagnosis of SARS-CoV-2 by FDA under an Emergency Use Authorization (EUA). This EUA will remain in effect (meaning this test can be used) for the duration of the COVID-19 declaration under Section 564(b)(1) of the Act, 21 U.S.C. section 360bbb-3(b)(1), unless the authorization is terminated or revoked.  Performed at [facility], [address] [place], [place] [address]   Culture, beta strep (group b only)     Status: None   Collection Time: 01/04/21 10:59 AM   Specimen: Vaginal/Rectal; Genital   Genital  Result Value Ref Range Status   Strep Gp B Culture Negative Negative Final    Comment: Centers for Disease Control and Prevention (  CDC) and [employer] (ACOG) guidelines for prevention of perinatal group B streptococcal (GBS) disease specify co-collection of a vaginal and rectal swab specimen to maximize sensitivity of GBS detection. Per the CDC and ACOG, swabbing both the lower vagina and rectum  substantially increases the yield of detection compared with sampling the vagina alone. Penicillin G, ampicillin, or cefazolin are indicated for intrapartum prophylaxis of perinatal GBS colonization. Reflex susceptibility testing should be performed prior to use of clindamycin only on GBS isolates from penicillin-allergic women who are considered a high risk for anaphylaxis. Treatment with vancomycin without additional testing is warranted if resistance to clindamycin is noted.     Medications:  Ampicillin 2 g Q6  Gent per Rx  Assessment: 25 y.o. female G2P0010 at [redacted]w[redacted]d admitted for IOL, now with presumed chorioamnionitis.  Starting ampicillin and gentamicin.    Plan:  Gentamicin 5mg /kg mg IV every 24 hrs  Check Scr with next labs if gentamicin continued. Will check gentamicin levels as clinically indicated.  Tayron Hunnell Scarlett 01/10/2021,5:43 AM

## 2021-01-10 NOTE — Discharge Summary (Signed)
Postpartum Discharge Summary    Patient Name: Shannon Ruiz DOB: 10/10/1996 MRN: [id number]  Date of admission: 01/08/2021 Delivery date:01/10/2021  Delivering provider: Lajean Manes  Date of discharge: 01/12/2021  Admitting diagnosis: Encounter for induction of labor [Z34.90] Intrauterine pregnancy: [redacted]w[redacted]d    Secondary diagnosis:  Active Problems:   Supervision of other normal pregnancy, antepartum   Cigarette smoker   Anxiety disorder affecting pregnancy, antepartum   Ventricular septal defect (VSD) of fetus affecting management of pregnancy   Pregnancy affected by fetal growth restriction   Gestational hypertension   Iron deficiency anemia secondary to inadequate dietary iron intake   Marginal insertion of umbilical cord affecting management of mother   COVID-19 affecting pregnancy in third trimester   Encounter for induction of labor   Rubella non-immune status, antepartum   SVD (spontaneous vaginal delivery)   Retained placenta without hemorrhage  Additional problems: none    Discharge diagnosis: Term Pregnancy Delivered and Preeclampsia (mild)                                              Post partum procedures:none Augmentation: AROM, Pitocin, Cytotec and IP Foley Complications: Retained placenta   Hospital course: Induction of Labor With Vaginal Delivery   25y.o. yo G2P0010 at 370w2das admitted to the hospital 01/08/2021 for induction of labor.  Indication for induction:  Fetal growth restriction .  Patient had an uncomplicated labor course as follows: Membrane Rupture Time/Date: 12:29 AM ,01/10/2021   Delivery Method:Vaginal, Spontaneous  Episiotomy: None  Lacerations:  Labial  Details of delivery can be found in separate delivery note.  Patient had a routine postpartum course. Patient is discharged home 01/12/21.  Newborn Data: Birth date:01/10/2021  Birth time:6:40 AM  Gender:Female  Living status:Living  Apgars:9 ,9  Weight:2509 g   Magnesium Sulfate  received: No BMZ received: No Rhophylac:N/A MMR:Yes T-DaP:declined Flu: No  Transfusion:No  Physical exam  Vitals:   01/11/21 0604 01/11/21 1450 01/11/21 2055 01/12/21 0701  BP: 125/80 (!) 129/92 130/88 119/81  Pulse: 79 75 67 66  Resp: _0 Temp: 97.9 F (36.6 C) 98.1 F (36.7 C) 98 F (36.7 C) 98.2 F (36.8 C)  TempSrc: Oral Oral Oral Oral  SpO2: 100%  100% 96%   General: alert, cooperative and no distress Lochia: appropriate Uterine Fundus: firm Incision: N/A DVT Evaluation: No evidence of DVT seen on physical exam. No significant calf/ankle edema. Labs: Lab Results  Component Value Date   WBC 21.6 (H) 01/09/2021   HGB 10.6 (L) 01/09/2021   HCT 32.1 (L) 01/09/2021   MCV 88.4 01/09/2021   PLT 411 (H) 01/09/2021   CMP Latest Ref Rng & Units 01/09/2021  Glucose 70 - 99 mg/dL 89  BUN 6 - 20 mg/dL <5(L)  Creatinine 0.44 - 1.00 mg/dL 0.41(L)  Sodium 135 - 145 mmol/L 136  Potassium 3.5 - 5.1 mmol/L 3.7  Chloride 98 - 111 mmol/L 110  CO2 22 - 32 mmol/L 17(L)  Calcium 8.9 - 10.3 mg/dL 8.9  Total Protein 6.5 - 8.1 g/dL 6.2(L)  Total Bilirubin 0.3 - 1.2 mg/dL 0.4  Alkaline Phos 38 - 126 U/L 119  AST 15 - 41 U/L 10(L)  ALT 0 - 44 U/L 8   Edinburgh Score: Edinburgh Postnatal Depression Scale Screening Tool 01/10/2021  I have been able to  laugh and see the funny side of things. 0  I have looked forward with enjoyment to things. 0  I have blamed myself unnecessarily when things went wrong. 2  I have been anxious or worried for no good reason. 3  I have felt scared or panicky for no good reason. 3  Things have been getting on top of me. 1  I have been so unhappy that I have had difficulty sleeping. 0  I have felt sad or miserable. 1  I have been so unhappy that I have been crying. 0  The thought of harming myself has occurred to me. 0  Edinburgh Postnatal Depression Scale Total 10   Counseled patient on PPD, offered medication at this time, patient declined.  Patient has hx of anxiety. Message sent to office to schedule mood check in 1 week and appointment with [facility].   After visit meds:  Allergies as of 01/12/2021   No Known Allergies     Medication List    TAKE these medications   ferrous gluconate 324 MG tablet Commonly known as: FERGON Take 1 tablet (324 mg total) by mouth daily with breakfast.   hydrOXYzine 25 MG capsule Commonly known as: Vistaril Take 1 capsule (25 mg total) by mouth 3 (three) times daily as needed for anxiety.   ibuprofen 600 MG tablet Commonly known as: ADVIL Take 1 tablet (600 mg total) by mouth every 6 (six) hours as needed for moderate pain or cramping.   NataChew 28-1 MG Chew Chew 1 tablet by mouth daily.   ondansetron 4 MG tablet Commonly known as: Zofran Take 1 tablet (4 mg total) by mouth every 8 (eight) hours as needed for nausea or vomiting.   potassium chloride SA 20 MEQ tablet Commonly known as: KLOR-CON Take 1 tablet (20 mEq total) by mouth 2 (two) times daily for 7 days.   promethazine 25 MG tablet Commonly known as: PHENERGAN Take 1 tablet (25 mg total) by mouth every 6 (six) hours as needed for nausea or vomiting.        Discharge home in stable condition Infant Feeding: Bottle Infant Disposition:home with mother Discharge instruction: per After Visit Summary and Postpartum booklet. Activity: Advance as tolerated. Pelvic rest for 6 weeks.  Diet: routine diet Future Appointments:No future appointments. Follow up Visit:  [place]. Schedule an appointment as soon as possible for a visit in 4 week(s).   Specialty: Obstetrics and Gynecology Contact information: [place] [address] [phone]              Please schedule this patient for a In person postpartum visit in 4 weeks with the following provider: Any provider. Additional Postpartum F/U:BP check 1  week and mood check  High risk pregnancy complicated by:  FGR Delivery mode:  Vaginal, Spontaneous  Anticipated Birth Control:  Unsure   01/12/2021 Lajean Manes, CNM

## 2021-01-10 NOTE — Anesthesia Postprocedure Evaluation (Signed)
Anesthesia Post Note  Patient: Shannon Ruiz  Procedure(s) Performed: AN AD HOC LABOR EPIDURAL     Patient location during evaluation: Mother Baby Anesthesia Type: Epidural Level of consciousness: awake and alert Pain management: pain level controlled Vital Signs Assessment: post-procedure vital signs reviewed and stable Respiratory status: spontaneous breathing, nonlabored ventilation and respiratory function stable Cardiovascular status: stable Postop Assessment: no headache, no backache and epidural receding Anesthetic complications: no   No complications documented.  Last Vitals:  Vitals:   01/10/21 0906 01/10/21 1046  BP: 118/67 120/78  Pulse: 92 90  Resp: 20 20  Temp: 36.8 C 36.8 C  SpO2: 100% 100%    Last Pain:  Vitals:   01/10/21 1046  TempSrc: Axillary  PainSc:    Pain Goal:                   [name],[name]

## 2021-01-11 NOTE — Progress Notes (Signed)
Post Partum Day 1 Subjective: no complaints, up ad lib, voiding and tolerating PO  Objective: Blood pressure 125/80, pulse 79, temperature 97.9 F (36.6 C), temperature source Oral, resp. rate 18, last menstrual period 04/24/2020, SpO2 100 %, unknown if currently breastfeeding.  Physical Exam:  General: alert, cooperative and no distress Lochia: appropriate Uterine Fundus: firm Incision: n/a DVT Evaluation: No evidence of DVT seen on physical exam.  Recent Labs    01/08/21 2228 01/09/21 1832  HGB 10.5* 10.6*  HCT 31.7* 32.1*    Assessment/Plan: Plan for discharge tomorrow and Contraception still unsure at this time, methods reviewed   LOS: 3 days   Rolm Bookbinder CNM 01/11/2021, 6:31 AM

## 2021-01-12 MED ORDER — IBUPROFEN 600 MG PO TABS: 600.0000 mg | ORAL_TABLET | Freq: Four times a day (QID) | ORAL | 0 refills | Status: DC | PRN

## 2021-01-12 MED ORDER — MEASLES, MUMPS & RUBELLA VAC IJ SOLR: 0.5000 mL | Freq: Once | INTRAMUSCULAR | Status: DC

## 2021-01-13 NOTE — BH Specialist Note (Signed)
Integrated Behavioral Health via Telemedicine Visit  01/13/2021 Shannon Ruiz [id number]  Number of Integrated Behavioral Health visits: 1 Session Start time: 1:28  Session End time: 1:57 Total time: 29  Referring Provider: Steward Drone, CNM Shannon/Family location: Home [facility] Provider location: Center for [facility] at [facility]  All persons participating in visit: Shannon Ruiz and Shannon Ruiz   Types of Service: Individual psychotherapy  I connected with Shannon Ruiz and/or [employer] n/a by Video  (Video is Caregility application) and verified that I am speaking with the correct person using two identifiers.Discussed confidentiality: Yes   I discussed the limitations of telemedicine and the availability of in person appointments.  Discussed there is a possibility of technology failure and discussed alternative modes of communication if that failure occurs.  I discussed that engaging in this telemedicine visit, they consent to the provision of behavioral healthcare and the services will be billed under their insurance.  Shannon and/or legal guardian expressed understanding and consented to Telemedicine visit: Yes   Presenting Concerns: Shannon and/or family reports the following symptoms/concerns: Pt states she is sleeping when baby sleeps, has supportive family and friends who check on her daily, has low appetite, but is able to eat small meals throughout the day. Pt's primary concern today is a need to reschedule her BP check, as she worries about pre-eclampsia; pt had headaches last week, no headache today, and pain in her wrists.  Duration of problem: Postpartum; Severity of problem: mild  Shannon and/or Family's Strengths/Protective Factors: Social connections, Concrete supports in place (healthy food, safe environments, etc.) and Sense of purpose  Goals Addressed: Shannon will: 1.  Reduce symptoms of: anxiety and stress   2.  Increase knowledge and/or ability of: healthy habits  3.  Demonstrate ability to: Increase healthy adjustment to current life circumstances  Progress towards Goals: Ongoing  Interventions: Interventions utilized:  Solution-Focused Strategies Standardized Assessments completed: GAD-7 and PHQ 9  Shannon and/or Family Response: Pt agrees with treatment plan  Assessment: Shannon currently experiencing Psychosocial stress.   Shannon may benefit from psychoeducation and brief therapeutic interventions regarding coping with symptoms of anxiety and current life stress .  Plan: 1. Follow up with behavioral health clinician on : Call Shannon Ruiz at [phone] as needed 2. Behavioral recommendations:  -Continue taking iron pill as prescribed (may take with orange juice instead of water) -Continue sleeping when baby sleeps, eating routine meals throughout the day, and accepting offers of help from family and friends postpartum -Consider registering for and attending new mom support group at either www.postpartum.net or www.conehealthybaby.com  -Consider apps (on After Visit Summary) for additional self-care as needed 3. Referral(s): Integrated Art gallery manager (In Clinic) and [employer]:  New mom support  I discussed the assessment and treatment plan with the Shannon and/or parent/guardian. They were provided an opportunity to ask questions and all were answered. They agreed with the plan and demonstrated an understanding of the instructions.   They were advised to call back or seek an in-person evaluation if the symptoms worsen or if the condition fails to improve as anticipated.  Shannon Close Ela Moffat, LCSW

## 2021-01-18 ENCOUNTER — Ambulatory Visit: Payer: Medicaid Other

## 2021-01-21 ENCOUNTER — Ambulatory Visit (INDEPENDENT_AMBULATORY_CARE_PROVIDER_SITE_OTHER): Payer: Medicaid Other | Admitting: Clinical

## 2021-01-21 ENCOUNTER — Other Ambulatory Visit: Payer: Self-pay

## 2021-01-21 DIAGNOSIS — Z658 Other specified problems related to psychosocial circumstances: Secondary | ICD-10-CM

## 2021-01-21 NOTE — Patient Instructions (Signed)
Center for [facility] Healthcare at [facility] [address] [place], [place] [address] [phone] (main office) [phone] (Vetra Shinall's office)  New mom support:  Www.postpartum.net Www.conehealthybaby.com   /Emotional Wellbeing Apps and Websites Here are a few free apps meant to help you to help yourself.  To find, try searching on the internet to see if the app is offered on Apple/Android devices. If your first choice doesn't come up on your device, the good news is that there are many choices! Play around with different apps to see which ones are helpful to you.    Calm This is an app meant to help increase calm feelings. Includes info, strategies, and tools for tracking your feelings.      Calm Harm  This app is meant to help with self-harm. Provides many 5-minute or 15-min coping strategies for doing instead of hurting yourself.       Healthy Minds Health Minds is a problem-solving tool to help deal with emotions and cope with stress you encounter wherever you are.      MindShift This app can help people cope with anxiety. Rather than trying to avoid anxiety, you can make an important shift and face it.      MY3  MY3 features a support system, safety plan and resources with the goal of offering a tool to use in a time of need.       My Life My Voice  This mood journal offers a simple solution for tracking your thoughts, feelings and moods. Animated emoticons can help identify your mood.       Relax Melodies Designed to help with sleep, on this app you can mix sounds and meditations for relaxation.      Smiling Mind Smiling Mind is meditation made easy: it's a simple tool that helps put a smile on your mind.        Stop, Breathe & Think  A friendly, simple guide for people through meditations for mindfulness and compassion.  Stop, Breathe and Think Kids Enter your current feelings and choose a "mission" to help you cope. Offers videos  for certain moods instead of just sound recordings.       Team Orange The goal of this tool is to help teens change how they think, act, and react. This app helps you focus on your own good feelings and experiences.      The [employer] Box The [employer] Box (VHB) contains simple tools to help patients with coping, relaxation, distraction, and positive thinking.

## 2021-01-22 ENCOUNTER — Ambulatory Visit: Payer: Medicaid Other

## 2021-02-11 ENCOUNTER — Ambulatory Visit: Payer: Medicaid Other | Admitting: Family Medicine

## 2021-02-24 ENCOUNTER — Other Ambulatory Visit (HOSPITAL_COMMUNITY)
Admission: RE | Admit: 2021-02-24 | Discharge: 2021-02-24 | Disposition: A | Discharge status: Home / Self Care | Payer: Medicaid Other | Source: Ambulatory Visit | Attending: Family Medicine | Admitting: Family Medicine

## 2021-02-24 ENCOUNTER — Encounter: Payer: Self-pay | Admitting: Family Medicine

## 2021-02-24 ENCOUNTER — Other Ambulatory Visit: Payer: Self-pay

## 2021-02-24 ENCOUNTER — Ambulatory Visit (INDEPENDENT_AMBULATORY_CARE_PROVIDER_SITE_OTHER): Payer: Medicaid Other | Admitting: Family Medicine

## 2021-02-24 VITALS — Ht 63.0 in | Wt 192.0 lb

## 2021-02-24 DIAGNOSIS — N898 Other specified noninflammatory disorders of vagina: Secondary | ICD-10-CM | POA: Diagnosis not present

## 2021-02-24 NOTE — Progress Notes (Signed)
Post Partum Visit Note  Shannon Ruiz is a 25 y.o. G49P1011 female who presents for a postpartum visit. She is 6 weeks postpartum following a normal spontaneous vaginal delivery.  I have fully reviewed the prenatal and intrapartum course. The delivery was at 37.2 gestational weeks.  Anesthesia: epidural. Postpartum course has been uneventful. Baby is doing well, but has acid reflux. Baby is feeding by bottle - Enfamil AR. Bleeding no bleeding. Bowel function is normal. Bladder function is normal. Patient is sexually active. Contraception method is condoms. Postpartum depression screening: negative. Score 2  Having discharge with odor.   The pregnancy intention screening data noted above was reviewed. Potential methods of contraception were discussed. The patient elected to proceed with Female Condom.    Edinburgh Postnatal Depression Scale - 02/24/21 1545      Edinburgh Postnatal Depression Scale:  In the Past 7 Days   I have been able to laugh and see the funny side of things. 0    I have looked forward with enjoyment to things. 0    I have blamed myself unnecessarily when things went wrong. 1    I have been anxious or worried for no good reason. 0    I have felt scared or panicky for no good reason. 0    Things have been getting on top of me. 1    I have been so unhappy that I have had difficulty sleeping. 0    I have felt sad or miserable. 0    I have been so unhappy that I have been crying. 0    The thought of harming myself has occurred to me. 0    Edinburgh Postnatal Depression Scale Total 2            The following portions of the patient's history were reviewed and updated as appropriate: allergies, current medications, past family history, past medical history, past social history, past surgical history and problem list.  Review of Systems Pertinent items are noted in HPI.    Objective:  Ht 5\' 3"  (1.6 m)   Wt 192 lb (87.1 kg)   LMP 02/12/2021 (Exact Date)    Breastfeeding No   BMI 34.01 kg/m    General:  alert, cooperative and no distress  Lungs: clear to auscultation bilaterally  Heart:  regular rate and rhythm, S1, S2 normal, no murmur, click, rub or gallop  Abdomen: soft, non-tender; bowel sounds normal; no masses,  no organomegaly        Assessment:    Normal postpartum exam. Pap smear not done at today's visit.   Plan:   Essential components of care per ACOG recommendations:  1.  Mood and well being: Patient with negative depression screening today. Reviewed local resources for support.  - Patient does not use tobacco.  - hx of drug use? No    2. Infant care and feeding:  -Patient currently breastmilk feeding? No  -Social determinants of health (SDOH) reviewed in EPIC. No concerns  3. Sexuality, contraception and birth spacing - Patient does not want a pregnancy in the next year.  - Reviewed forms of contraception in tiered fashion. Patient desired condoms today.   - Discussed birth spacing of 18 months  4. Sleep and fatigue -Encouraged family/partner/community support of 4 hrs of uninterrupted sleep to help with mood and fatigue  5. Physical Recovery  - Discussed patients delivery and complications - Patient had a labial laceration, perineal healing reviewed. Patient expressed understanding - Patient has  urinary incontinence? No - Patient is not safe to resume physical and sexual activity  6.  Health Maintenance - Last pap smear done 06/30/2020 and was normal with negative HPV.  7. Vaginal discharge - wet prep   Levie Heritage, DO Center for [employer], [facility]

## 2021-02-26 LAB — CERVICOVAGINAL ANCILLARY ONLY
Bacterial Vaginitis (gardnerella): POSITIVE — AB
Candida Glabrata: NEGATIVE
Candida Vaginitis: NEGATIVE
Comment: NEGATIVE
Comment: NEGATIVE
Comment: NEGATIVE

## 2021-03-01 MED ORDER — METRONIDAZOLE 500 MG PO TABS: 500.0000 mg | ORAL_TABLET | Freq: Two times a day (BID) | ORAL | 0 refills | Status: DC

## 2021-03-01 NOTE — Addendum Note (Signed)
Addended by: Levie Heritage on: 03/01/2021 03:36 PM   Modules accepted: Orders

## 2021-03-18 ENCOUNTER — Telehealth: Payer: Self-pay

## 2021-03-18 NOTE — Telephone Encounter (Signed)
Patient states that she has a bulge in her vaginal opening. Patient is having pelvic pain and heavy bleeding.  Patient is also having the second period since delivery and it is heavy - she is using large pad and tampon and changing every four hours.  Patient offered appointment tomorrow morning- patient accepts. [name] RN

## 2021-03-19 ENCOUNTER — Ambulatory Visit: Payer: Medicaid Other | Admitting: Family Medicine

## 2021-07-08 ENCOUNTER — Telehealth: Payer: Self-pay

## 2021-07-08 DIAGNOSIS — O219 Vomiting of pregnancy, unspecified: Secondary | ICD-10-CM

## 2021-07-08 MED ORDER — PROMETHAZINE HCL 25 MG PO TABS: 25.0000 mg | ORAL_TABLET | Freq: Four times a day (QID) | ORAL | 1 refills | Status: DC | PRN

## 2021-07-08 NOTE — Telephone Encounter (Signed)
Pt called requesting a Rx for nausea and vomiting.Phenergan 25 mg PO q6h was sent to her pharmacy.  Tilia Faso l Omeed Osuna, CMA

## 2021-08-18 ENCOUNTER — Encounter: Payer: Medicaid Other | Admitting: Family Medicine

## 2021-09-03 ENCOUNTER — Encounter: Payer: Self-pay | Admitting: General Practice

## 2021-09-03 ENCOUNTER — Ambulatory Visit (INDEPENDENT_AMBULATORY_CARE_PROVIDER_SITE_OTHER): Payer: Medicaid Other | Admitting: Obstetrics & Gynecology

## 2021-09-03 ENCOUNTER — Other Ambulatory Visit: Payer: Self-pay

## 2021-09-03 ENCOUNTER — Encounter: Payer: Self-pay | Admitting: Obstetrics & Gynecology

## 2021-09-03 ENCOUNTER — Other Ambulatory Visit (HOSPITAL_COMMUNITY)
Admission: RE | Admit: 2021-09-03 | Discharge: 2021-09-03 | Disposition: A | Discharge status: Home / Self Care | Payer: Medicaid Other | Source: Ambulatory Visit | Attending: Family Medicine | Admitting: Family Medicine

## 2021-09-03 VITALS — BP 128/78 | HR 106 | Wt 213.0 lb

## 2021-09-03 DIAGNOSIS — Z3480 Encounter for supervision of other normal pregnancy, unspecified trimester: Secondary | ICD-10-CM | POA: Insufficient documentation

## 2021-09-03 DIAGNOSIS — O99211 Obesity complicating pregnancy, first trimester: Secondary | ICD-10-CM | POA: Diagnosis not present

## 2021-09-03 DIAGNOSIS — O09891 Supervision of other high risk pregnancies, first trimester: Secondary | ICD-10-CM

## 2021-09-03 DIAGNOSIS — O09899 Supervision of other high risk pregnancies, unspecified trimester: Secondary | ICD-10-CM

## 2021-09-03 DIAGNOSIS — Z3A12 12 weeks gestation of pregnancy: Secondary | ICD-10-CM

## 2021-09-03 DIAGNOSIS — Z2839 Other underimmunization status: Secondary | ICD-10-CM

## 2021-09-03 DIAGNOSIS — O99210 Obesity complicating pregnancy, unspecified trimester: Secondary | ICD-10-CM | POA: Insufficient documentation

## 2021-09-03 MED ORDER — PREPLUS 27-1 MG PO TABS: 1.0000 | ORAL_TABLET | Freq: Every day | ORAL | 13 refills | Status: DC

## 2021-09-03 MED ORDER — ASPIRIN EC 81 MG PO TBEC: 81.0000 mg | DELAYED_RELEASE_TABLET | Freq: Every day | ORAL | 2 refills | Status: DC

## 2021-09-03 NOTE — Progress Notes (Signed)
DATING AND VIABILITY SONOGRAM   Shannon Ruiz is a 25 y.o. year old G15P1011 with LMP Patient's last menstrual period was 06/05/2021. which would correlate to  [redacted]w[redacted]d weeks gestation.  She has regular menstrual cycles.   She is here today for a confirmatory initial sonogram.    GESTATION: SINGLETON     FETAL ACTIVITY:          Heart rate         152 bpm          The fetus is active.   GESTATIONAL AGE AND  BIOMETRICS:  Gestational criteria: Estimated Date of Delivery: 03/12/22 by LMP now at [redacted]w[redacted]d  Previous Scans:0      CROWN RUMP LENGTH          5.24 cm         12 weeks        5.34 cm 12-1 weeks                                                                           AVERAGE EGA(BY THIS SCAN):  12-0 weeks  WORKING EDD( LMP ):  03-12-22     TECHNICIAN COMMENTS: Patient informed that the ultrasound is considered a limited obstetric ultrasound and is not intended to be a complete ultrasound exam.  Patient also informed that the ultrasound is not being completed with the intent of assessing for fetal or placental anomalies or any pelvic abnormalities. Explained that the purpose of today's ultrasound is to assess for fetal heart rate.  Patient acknowledges the purpose of the exam and the limitations of the study.    [name] 09/03/2021 9:26 AM

## 2021-09-03 NOTE — Progress Notes (Signed)
History:   Shannon Ruiz is a 25 y.o. G3P1011 at [redacted]w[redacted]d by LMP, early ultrasound being seen today for her first obstetrical visit.  Her obstetrical history is significant for  short interval in between pregnancies, just had a baby in 12/2020 . Patient does intend to breast feed. Pregnancy history fully reviewed.  Patient reports no complaints.      HISTORY: OB History  Gravida Para Term Preterm AB Living  3 1 1  0 1 1  SAB IAB Ectopic Multiple Live Births  1 0 0 0 1    # Outcome Date GA Lbr Len/2nd Weight Sex Delivery Anes PTL Lv  3 Current           2 Term 01/10/21 [redacted]w[redacted]d 30:25 / 00:14 5 lb 8.5 oz (2.509 kg) F Vag-Spont EPI  LIV     Name: Shannon Ruiz     Apgar1: 9  Apgar5: 9  1 SAB 2014            Last pap smear was done 06/30/2020 and was normal  Past Medical History:  Diagnosis Date   Anxiety    Depression    Past Surgical History:  Procedure Laterality Date   NO PAST SURGERIES     Family History  Problem Relation Age of Onset   Anxiety disorder Mother    Hypertension Mother    Heart Problems Mother    Diabetes Father    Social History   Tobacco Use   Smoking status: Never   Smokeless tobacco: Never  Vaping Use   Vaping Use: Never used  Substance Use Topics   Alcohol use: Never   Drug use: Never   No Known Allergies Current Outpatient Medications on File Prior to Visit  Medication Sig Dispense Refill   metroNIDAZOLE (FLAGYL) 500 MG tablet Take 1 tablet (500 mg total) by mouth 2 (two) times daily. (Patient not taking: Reported on 09/03/2021) 14 tablet 0   potassium chloride SA (KLOR-CON) 20 MEQ tablet Take 1 tablet (20 mEq total) by mouth 2 (two) times daily for 7 days. 14 tablet 0   promethazine (PHENERGAN) 25 MG tablet Take 1 tablet (25 mg total) by mouth every 6 (six) hours as needed for nausea or vomiting. (Patient not taking: Reported on 09/03/2021) 30 tablet 1   No current facility-administered medications on file prior to visit.    Review of  Systems Pertinent items noted in HPI and remainder of comprehensive ROS otherwise negative.  Physical Exam:   Vitals:   09/03/21 0908  BP: 128/78  Pulse: (!) 106  Weight: 213 lb (96.6 kg)     Bedside Ultrasound for FHR check: Viable intrauterine pregnancy with positive cardiac activity noted, fetal heart rate 152 bpm (see separate report) Patient informed that the ultrasound is considered a limited obstetric ultrasound and is not intended to be a complete ultrasound exam.  Patient also informed that the ultrasound is not being completed with the intent of assessing for fetal or placental anomalies or any pelvic abnormalities.  Explained that the purpose of today's ultrasound is to assess for fetal heart rate.  Patient acknowledges the purpose of the exam and the limitations of the study. General: well-developed, well-nourished female in no acute distress  Breasts:  deferred  Skin: normal coloration and turgor, no rashes  Neurologic: oriented, normal, negative, normal mood  Extremities: normal strength, tone, and muscle mass, ROM of all joints is normal  HEENT PERRLA, extraocular movement intact and sclera clear, anicteric  Neck supple and  no masses  Cardiovascular: regular rate and rhythm  Respiratory:  no respiratory distress, normal breath sounds  Abdomen: soft, non-tender; bowel sounds normal; no masses,  no organomegaly  Pelvic: deferred      Assessment:    Pregnancy: G3P1011 Patient Active Problem List   Diagnosis Date Noted   Supervision of other normal pregnancy, antepartum 09/03/2021   Obesity in pregnancy, antepartum 09/03/2021   Cigarette smoker 06/30/2020   Anxiety disorder affecting pregnancy, antepartum 06/30/2020     Plan:    1. Short interval between pregnancies affecting pregnancy, antepartum 2. Obesity in pregnancy, antepartum - [place] MFM OB DETAIL + 14 WK; Future - Comprehensive metabolic panel - Hemoglobin A1c - aspirin EC 81 MG tablet; Take 1 tablet (81 mg  total) by mouth daily. Take after 12 weeks for prevention of preeclampsia later in pregnancy  Dispense: 300 tablet; Refill: 2  3. [redacted] weeks gestation of pregnancy 4. Supervision of other normal pregnancy, antepartum - CBC/D/Plt+RPR+Rh+ABO+RubIgG... - [place] MFM OB DETAIL + 14 WK; Future - Culture, OB Urine - Genetic Screening - Urine cytology ancillary only([place]) - Hgb Fractionation [place] - Prenatal Vit-Fe Fumarate-FA (PREPLUS) 27-1 MG TABS; Take 1 tablet by mouth daily.  Dispense: 30 tablet; Refill: 13  Initial labs drawn. Prenatal vitamins and low dose ASA prescribed.. Problem list reviewed and updated. Genetic Screening discussed, NIPS: ordered. Ultrasound discussed; fetal anatomic survey: ordered. Anticipatory guidance about prenatal visits given including labs, ultrasounds, and testing. Encouraged to complete MyChart Registration for her ability to review results, send requests, and have questions addressed.  The nature of [facility] with multiple MDs and Advanced Practice Providers was explained to patient; also emphasized that residents, students are part of our team. Routine obstetric precautions reviewed. Encouraged to seek out care at office or emergency room [facility] preferred) for urgent and/or emergent concerns. Return in about 4 weeks (around 10/01/2021) for OFFICE OB VISIT (MD or APP).     Jaynie Collins, MD, FACOG Obstetrician & Gynecologist, [facility] for [employer], [facility]

## 2021-09-03 NOTE — Patient Instructions (Signed)
First Trimester of Pregnancy °The first trimester of pregnancy starts on the first day of your last menstrual period until the end of week 12. This is months 1 through 3 of pregnancy. A week after a sperm fertilizes an egg, the egg will implant into the wall of the uterus and begin to develop into a baby. By the end of 12 weeks, all the baby's organs will be formed and the baby will be 2-3 inches in size. °Body changes during your first trimester °Your body goes through many changes during pregnancy. The changes vary and generally return to normal after your baby is born. °Physical changes °You may gain or lose weight. °Your breasts may begin to grow larger and become tender. The tissue that surrounds your nipples (areola) may become darker. °Dark spots or blotches (chloasma or mask of pregnancy) may develop on your face. °You may have changes in your hair. These can include thickening or thinning of your hair or changes in texture. °Health changes °You may feel nauseous, and you may vomit. °You may have heartburn. °You may develop headaches. °You may develop constipation. °Your gums may bleed and may be sensitive to brushing and flossing. °Other changes °You may tire easily. °You may urinate more often. °Your menstrual periods will stop. °You may have a loss of appetite. °You may develop cravings for certain kinds of food. °You may have changes in your emotions from day to day. °You may have more vivid and strange dreams. °Follow these instructions at home: °Medicines °Follow your health care provider's instructions regarding medicine use. Specific medicines may be either safe or unsafe to take during pregnancy. Do not take any medicines unless told to by your health care provider. °Take a prenatal vitamin that contains at least 600 micrograms (mcg) of folic acid. °Eating and drinking °Eat a healthy diet that includes fresh fruits and vegetables, whole grains, good sources of protein such as meat, eggs, or tofu,  and low-fat dairy products. °Avoid raw meat and unpasteurized juice, milk, and cheese. These carry germs that can harm you and your baby. °If you feel nauseous or you vomit: °Eat 4 or 5 small meals a day instead of 3 large meals. °Try eating a few soda crackers. °Drink liquids between meals instead of during meals. °You may need to take these actions to prevent or treat constipation: °Drink enough fluid to keep your urine pale yellow. °Eat foods that are high in fiber, such as beans, whole grains, and fresh fruits and vegetables. °Limit foods that are high in fat and processed sugars, such as fried or sweet foods. °Activity °Exercise only as directed by your health care provider. Most people can continue their usual exercise routine during pregnancy. Try to exercise for 30 minutes at least 5 days a week. °Stop exercising if you develop pain or cramping in the lower abdomen or lower back. °Avoid exercising if it is very hot or humid or if you are at high altitude. °Avoid heavy lifting. °If you choose to, you may have sex unless your health care provider tells you not to. °Relieving pain and discomfort °Wear a good support bra to relieve breast tenderness. °Rest with your legs elevated if you have leg cramps or low back pain. °If you develop bulging veins (varicose veins) in your legs: °Wear support hose as told by your health care provider. °Elevate your feet for 15 minutes, 3-4 times a day. °Limit salt in your diet. °Safety °Wear your seat belt at all times when driving   or riding in a car. °Talk with your health care provider if someone is verbally or physically abusive to you. °Talk with your health care provider if you are feeling sad or have thoughts of hurting yourself. °Lifestyle °Do not use hot tubs, steam rooms, or saunas. °Do not douche. Do not use tampons or scented sanitary pads. °Do not use herbal remedies, alcohol, illegal drugs, or medicines that are not approved by your health care provider. Chemicals  in these products can harm your baby. °Do not use any products that contain nicotine or tobacco, such as cigarettes, e-cigarettes, and chewing tobacco. If you need help quitting, ask your health care provider. °Avoid cat litter boxes and soil used by cats. These carry germs that can cause birth defects in the baby and possibly loss of the unborn baby (fetus) by miscarriage or stillbirth. °General instructions °During routine prenatal visits in the first trimester, your health care provider will do a physical exam, perform necessary tests, and ask you how things are going. Keep all follow-up visits. This is important. °Ask for help if you have counseling or nutritional needs during pregnancy. Your health care provider can offer advice or refer you to specialists for help with various needs. °Schedule a dentist appointment. At home, brush your teeth with a soft toothbrush. Floss gently. °Write down your questions. Take them to your prenatal visits. °Where to find more information °American Pregnancy Association: americanpregnancy.org °American College of Obstetricians and Gynecologists: acog.org/en/Womens%20Health/Pregnancy °Office on Women's Health: womenshealth.gov/pregnancy °Contact a health care provider if you have: °Dizziness. °A fever. °Mild pelvic cramps, pelvic pressure, or nagging pain in the abdominal area. °Nausea, vomiting, or diarrhea that lasts for 24 hours or longer. °A bad-smelling vaginal discharge. °Pain when you urinate. °Known exposure to a contagious illness, such as chickenpox, measles, Zika virus, HIV, or hepatitis. °Get help right away if you have: °Spotting or bleeding from your vagina. °Severe abdominal cramping or pain. °Shortness of breath or chest pain. °Any kind of trauma, such as from a fall or a car crash. °New or increased pain, swelling, or redness in an arm or leg. °Summary °The first trimester of pregnancy starts on the first day of your last menstrual period until the end of week  12 (months 1 through 3). °Eating 4 or 5 small meals a day rather than 3 large meals may help to relieve nausea and vomiting. °Do not use any products that contain nicotine or tobacco, such as cigarettes, e-cigarettes, and chewing tobacco. If you need help quitting, ask your health care provider. °Keep all follow-up visits. This is important. °This information is not intended to replace advice given to you by your health care provider. Make sure you discuss any questions you have with your health care provider. °Document Revised: 05/20/2020 Document Reviewed: 03/26/2020 °Elsevier Patient Education © 2022 [employer] ° °

## 2021-09-05 LAB — URINE CULTURE, OB REFLEX

## 2021-09-05 LAB — CULTURE, OB URINE

## 2021-09-06 LAB — URINE CYTOLOGY ANCILLARY ONLY
Chlamydia: NEGATIVE
Comment: NEGATIVE
Comment: NORMAL
Neisseria Gonorrhea: NEGATIVE

## 2021-09-07 LAB — COMPREHENSIVE METABOLIC PANEL
ALT: 16 IU/L (ref 0–32)
AST: 11 IU/L (ref 0–40)
Albumin/Globulin Ratio: 1.4 (ref 1.2–2.2)
Albumin: 4 g/dL (ref 3.9–5.0)
Alkaline Phosphatase: 67 IU/L (ref 44–121)
BUN/Creatinine Ratio: 13 (ref 9–23)
BUN: 6 mg/dL (ref 6–20)
Bilirubin Total: <0.2 mg/dL (ref 0.0–1.2)
CO2: 15 mmol/L — ABNORMAL LOW (ref 20–29)
Calcium: 9.8 mg/dL (ref 8.7–10.2)
Chloride: 106 mmol/L (ref 96–106)
Creatinine, Ser: 0.48 mg/dL — ABNORMAL LOW (ref 0.57–1.00)
Globulin, Total: 2.8 g/dL (ref 1.5–4.5)
Glucose: 86 mg/dL (ref 65–99)
Potassium: 3.9 mmol/L (ref 3.5–5.2)
Sodium: 139 mmol/L (ref 134–144)
Total Protein: 6.8 g/dL (ref 6.0–8.5)
eGFR: 135 mL/min/{1.73_m2} (ref >59)

## 2021-09-07 LAB — CBC/D/PLT+RPR+RH+ABO+RUBIGG...
Antibody Screen: NEGATIVE
Basophils Absolute: 0.1 10*3/uL (ref 0.0–0.2)
Basos: 1 %
EOS (ABSOLUTE): 0.3 10*3/uL (ref 0.0–0.4)
Eos: 2 %
HCV Ab: <0.1 s/co ratio (ref 0.0–0.9)
HIV Screen 4th Generation wRfx: NONREACTIVE
Hematocrit: 36.6 % (ref 34.0–46.6)
Hemoglobin: 12.1 g/dL (ref 11.1–15.9)
Hepatitis B Surface Ag: NEGATIVE
Immature Grans (Abs): 0.1 10*3/uL (ref 0.0–0.1)
Immature Granulocytes: 1 %
Lymphocytes Absolute: 2.6 10*3/uL (ref 0.7–3.1)
Lymphs: 17 %
MCH: 28.4 pg (ref 26.6–33.0)
MCHC: 33.1 g/dL (ref 31.5–35.7)
MCV: 86 fL (ref 79–97)
Monocytes Absolute: 1.1 10*3/uL — ABNORMAL HIGH (ref 0.1–0.9)
Monocytes: 7 %
Neutrophils Absolute: 10.7 10*3/uL — ABNORMAL HIGH (ref 1.4–7.0)
Neutrophils: 72 %
Platelets: 490 10*3/uL — ABNORMAL HIGH (ref 150–450)
RBC: 4.26 x10E6/uL (ref 3.77–5.28)
RDW: 12.8 % (ref 11.7–15.4)
RPR Ser Ql: NONREACTIVE
Rh Factor: POSITIVE
Rubella Antibodies, IGG: <0.9 index — ABNORMAL LOW (ref >0.99)
WBC: 14.8 10*3/uL — ABNORMAL HIGH (ref 3.4–10.8)

## 2021-09-07 LAB — HEMOGLOBIN A1C
Est. average glucose Bld gHb Est-mCnc: 108 mg/dL
Hgb A1c MFr Bld: 5.4 % (ref 4.8–5.6)

## 2021-09-07 LAB — HGB FRACTIONATION CASCADE
Hgb A2: 2.7 % (ref 1.8–3.2)
Hgb A: 97.3 % (ref 96.4–98.8)
Hgb F: 0 % (ref 0.0–2.0)
Hgb S: 0 %

## 2021-09-07 LAB — HCV INTERPRETATION

## 2021-09-07 NOTE — Addendum Note (Signed)
Addended by: Jaynie Collins A on: 09/07/2021 01:01 PM   Modules accepted: Orders

## 2021-09-29 ENCOUNTER — Other Ambulatory Visit: Payer: Self-pay

## 2021-09-29 ENCOUNTER — Ambulatory Visit (INDEPENDENT_AMBULATORY_CARE_PROVIDER_SITE_OTHER): Payer: Medicaid Other | Admitting: Family Medicine

## 2021-09-29 VITALS — BP 130/78 | HR 104 | Wt 211.0 lb

## 2021-09-29 DIAGNOSIS — Z3480 Encounter for supervision of other normal pregnancy, unspecified trimester: Secondary | ICD-10-CM

## 2021-09-29 DIAGNOSIS — Z2839 Other underimmunization status: Secondary | ICD-10-CM

## 2021-09-29 DIAGNOSIS — F17210 Nicotine dependence, cigarettes, uncomplicated: Secondary | ICD-10-CM

## 2021-09-29 DIAGNOSIS — O99340 Other mental disorders complicating pregnancy, unspecified trimester: Secondary | ICD-10-CM

## 2021-09-29 DIAGNOSIS — F419 Anxiety disorder, unspecified: Secondary | ICD-10-CM

## 2021-09-29 DIAGNOSIS — M6289 Other specified disorders of muscle: Secondary | ICD-10-CM

## 2021-09-29 DIAGNOSIS — Z3A16 16 weeks gestation of pregnancy: Secondary | ICD-10-CM

## 2021-09-29 DIAGNOSIS — O09899 Supervision of other high risk pregnancies, unspecified trimester: Secondary | ICD-10-CM

## 2021-09-29 NOTE — Progress Notes (Signed)
PRENATAL VISIT NOTE  Subjective:  Shannon Ruiz is a 25 y.o. G3P1011 at 56w4dbeing seen today for ongoing prenatal care.  She is currently monitored for the following issues for this low-risk pregnancy and has Cigarette smoker; Anxiety disorder affecting pregnancy, antepartum; Rubella non-immune status, antepartum; Supervision of other normal pregnancy, antepartum; and Obesity in pregnancy, antepartum on their problem list.  Patient reports  having pain in sides by iliac crest, usually worse at the end of the day. Not noticing any movements that are making the pain worse .  Contractions: Not present. Vag. Bleeding: None.  Movement: Present. Denies leaking of fluid.   The following portions of the patient's history were reviewed and updated as appropriate: allergies, current medications, past family history, past medical history, past social history, past surgical history and problem list.   Objective:   Vitals:   09/29/21 1005  BP: 130/78  Pulse: (!) 104  Weight: 211 lb (95.7 kg)    Fetal Status: Fetal Heart Rate (bpm): 150 Fundal Height: 16 cm Movement: Present     General:  Alert, oriented and cooperative. Patient is in no acute distress.  Skin: Skin is warm and dry. No rash noted.   Cardiovascular: Normal heart rate noted  Respiratory: Normal respiratory effort, no problems with respiration noted  Abdomen: Soft, gravid, appropriate for gestational age.  Pain/Pressure: Present     Pelvic: Cervical exam deferred        Extremities: Normal range of motion.  Edema: None. Pain on psoas  Mental Status: Normal mood and affect. Normal behavior. Normal judgment and thought content.   Assessment and Plan:  Pregnancy: G3P1011 at 162w4d. [redacted] weeks gestation of pregnancy  2. Supervision of other normal pregnancy, antepartum FHT and FH normal  3. Rubella non-immune status, antepartum MMR post delivery  4. Psoas syndrome Stretching demonstrated  5. Cigarette smoker  6. Anxiety  disorder affecting pregnancy, antepartum   Preterm labor symptoms and general obstetric precautions including but not limited to vaginal bleeding, contractions, leaking of fluid and fetal movement were reviewed in detail with the patient. Please refer to After Visit Summary for other counseling recommendations.   No follow-ups on file.  Future Appointments  Date Time Provider [place]10/18/2022  9:30 AM [facility]  9:45 AM [facility] US[facility]  9:35 AM StNehemiah SettleJaTanna SavoyDO [facility] None    JaTruett MainlandDO

## 2021-09-29 NOTE — Progress Notes (Signed)
Patient states she has been experiencing some pain "in my sides"   Patient declines flu shot today.[name] RN

## 2021-10-11 ENCOUNTER — Ambulatory Visit: Payer: Medicaid Other

## 2021-10-12 ENCOUNTER — Other Ambulatory Visit: Payer: Self-pay

## 2021-10-12 ENCOUNTER — Other Ambulatory Visit: Payer: Self-pay | Admitting: *Deleted

## 2021-10-12 ENCOUNTER — Ambulatory Visit: Payer: Medicaid Other | Attending: Obstetrics & Gynecology | Admitting: *Deleted

## 2021-10-12 ENCOUNTER — Ambulatory Visit (HOSPITAL_BASED_OUTPATIENT_CLINIC_OR_DEPARTMENT_OTHER): Payer: Medicaid Other

## 2021-10-12 VITALS — BP 119/66 | HR 102

## 2021-10-12 DIAGNOSIS — O09891 Supervision of other high risk pregnancies, first trimester: Secondary | ICD-10-CM | POA: Diagnosis not present

## 2021-10-12 DIAGNOSIS — O99331 Smoking (tobacco) complicating pregnancy, first trimester: Secondary | ICD-10-CM | POA: Diagnosis not present

## 2021-10-12 DIAGNOSIS — O99212 Obesity complicating pregnancy, second trimester: Secondary | ICD-10-CM | POA: Diagnosis not present

## 2021-10-12 DIAGNOSIS — F17200 Nicotine dependence, unspecified, uncomplicated: Secondary | ICD-10-CM | POA: Diagnosis not present

## 2021-10-12 DIAGNOSIS — Z3480 Encounter for supervision of other normal pregnancy, unspecified trimester: Secondary | ICD-10-CM

## 2021-10-12 DIAGNOSIS — O99210 Obesity complicating pregnancy, unspecified trimester: Secondary | ICD-10-CM

## 2021-10-12 DIAGNOSIS — Z362 Encounter for other antenatal screening follow-up: Secondary | ICD-10-CM

## 2021-10-12 DIAGNOSIS — Z3A18 18 weeks gestation of pregnancy: Secondary | ICD-10-CM | POA: Diagnosis not present

## 2021-10-12 DIAGNOSIS — Z3689 Encounter for other specified antenatal screening: Secondary | ICD-10-CM

## 2021-10-12 DIAGNOSIS — Z3A12 12 weeks gestation of pregnancy: Secondary | ICD-10-CM

## 2021-10-12 DIAGNOSIS — O09899 Supervision of other high risk pregnancies, unspecified trimester: Secondary | ICD-10-CM

## 2021-10-12 DIAGNOSIS — E669 Obesity, unspecified: Secondary | ICD-10-CM | POA: Diagnosis not present

## 2021-10-12 DIAGNOSIS — Z2839 Other underimmunization status: Secondary | ICD-10-CM

## 2021-10-27 ENCOUNTER — Telehealth (INDEPENDENT_AMBULATORY_CARE_PROVIDER_SITE_OTHER): Payer: Medicaid Other | Admitting: Family Medicine

## 2021-10-27 DIAGNOSIS — Z3480 Encounter for supervision of other normal pregnancy, unspecified trimester: Secondary | ICD-10-CM

## 2021-10-27 DIAGNOSIS — Z8759 Personal history of other complications of pregnancy, childbirth and the puerperium: Secondary | ICD-10-CM

## 2021-10-27 DIAGNOSIS — O09899 Supervision of other high risk pregnancies, unspecified trimester: Secondary | ICD-10-CM

## 2021-10-27 DIAGNOSIS — Z2839 Other underimmunization status: Secondary | ICD-10-CM

## 2021-10-27 DIAGNOSIS — Z3A20 20 weeks gestation of pregnancy: Secondary | ICD-10-CM

## 2021-10-27 DIAGNOSIS — F17210 Nicotine dependence, cigarettes, uncomplicated: Secondary | ICD-10-CM

## 2021-10-27 DIAGNOSIS — O09292 Supervision of pregnancy with other poor reproductive or obstetric history, second trimester: Secondary | ICD-10-CM | POA: Insufficient documentation

## 2021-10-27 DIAGNOSIS — O99332 Smoking (tobacco) complicating pregnancy, second trimester: Secondary | ICD-10-CM

## 2021-10-27 NOTE — Progress Notes (Signed)
Attempted to reach patient by phone to start visit. [name] RN

## 2021-10-27 NOTE — Progress Notes (Signed)
OBSTETRICS PRENATAL VIRTUAL VISIT ENCOUNTER NOTE  Provider location: [facility]   Patient location: Home  I connected with Shannon Ruiz on 10/27/21 at  9:35 AM EDT by MyChart Video Encounter and verified that I am speaking with the correct person using two identifiers. I discussed the limitations, risks, security and privacy concerns of performing an evaluation and management service virtually and the availability of in person appointments. I also discussed with the patient that there may be a patient responsible charge related to this service. The patient expressed understanding and agreed to proceed. Subjective:  [name] is a 25 y.o. G3P1011 at 93w4dbeing seen today for ongoing prenatal care.  She is currently monitored for the following issues for this low-risk pregnancy and has Cigarette smoker; Anxiety disorder affecting pregnancy, antepartum; Rubella non-immune status, antepartum; Supervision of other normal pregnancy, antepartum; and Obesity in pregnancy, antepartum on their problem list.  Patient reports no complaints.  Contractions: Not present. Vag. Bleeding: None.  Movement: Present. Denies any leaking of fluid.   The following portions of the patient's history were reviewed and updated as appropriate: allergies, current medications, past family history, past medical history, past social history, past surgical history and problem list.   Objective:  There were no vitals filed for this visit.  Fetal Status:     Movement: Present     General:  Alert, oriented and cooperative. Patient is in no acute distress.  Respiratory: Normal respiratory effort, no problems with respiration noted  Mental Status: Normal mood and affect. Normal behavior. Normal judgment and thought content.  Rest of physical exam deferred due to type of encounter  Imaging: UKoreaMFM OB DETAIL +14 WK  Result Date:  10/12/2021 ----------------------------------------------------------------------  OBSTETRICS REPORT                       (Signed Final 10/12/2021 11:22 am) ---------------------------------------------------------------------- Patient Info  ID #:       [id number]                         D.O.B.:  [id number](25 yrs)  Name:       Shannon Ruiz                   Visit Date: 10/12/2021 10:00 am ---------------------------------------------------------------------- Performed By  Attending:        CSander Nephew     Ref. Address:     [address] Performed By:     JGeorgie Chard       [facility]                                                             [place] [facility]                                                             [facility]  Referred By:      [facility] ---------------------------------------------------------------------- Orders  #  Description                           Code        Ordered By  1  [place] MFM OB DETAIL +14 WK               76811.01    Verita Schneiders ----------------------------------------------------------------------  #  Order #                     Accession #                Episode #  1  [id number]                   [id number]                 [id number] ---------------------------------------------------------------------- Indications  Obesity complicating pregnancy, second         O99.212  trimester (BMI 33)  Encounter for antenatal screening for          Z36.3  malformations  Short interval between pregancies, 2nd         O09.892  trimester (12/2020)  Tobacco use complicating pregnancy             O99.330  [redacted] weeks gestation of pregnancy                Z3A.18  Low Risk NIPS  Neg Horizon ---------------------------------------------------------------------- Fetal Evaluation  Num Of Fetuses:          1  Fetal Heart Rate(bpm):  140  Cardiac Activity:       Observed  Presentation:           Transverse, head to maternal right  Placenta:               Posterior  P. Cord Insertion:      Not well visualized  Amniotic Fluid  AFI FV:      Within normal limits                              Largest Pocket(cm)                              4.74 ---------------------------------------------------------------------- Biometry  BPD:      41.5  mm     G. Age:  18w 4d         57  %    CI:        69.58   %  70 - 86                                                          FL/HC:      17.9   %    15.8 - 18  HC:      158.8  mm     G. Age:  18w 5d         58  %    HC/AC:      1.14        1.07 - 1.29  AC:       139   mm     G. Age:  19w 2d         75  %    FL/BPD:     68.4   %  FL:       28.4  mm     G. Age:  18w 5d         55  %    FL/AC:      20.4   %    20 - 24  HUM:        26  mm     G. Age:  18w 1d         47  %  CER:      19.8  mm     G. Age:  19w 1d         82  %  NFT:       3.3  mm  LV:        6.8  mm  CM:          6  mm  Est. FW:     268  gm      0 lb 9 oz     79  % ---------------------------------------------------------------------- OB History  Gravidity:    3         Term:   1         SAB:   1  Living:       1 ---------------------------------------------------------------------- Gestational Age  LMP:           18w 3d        Date:  06/05/21                 EDD:   03/12/22  U/S Today:     18w 6d                                        EDD:   03/09/22  Best:          18w 3d     Det. By:  LMP  (06/05/21)          EDD:   03/12/22 ---------------------------------------------------------------------- Anatomy  Cranium:               Appears normal         LVOT:                   Not well visualized  Cavum:                 Appears normal  Aortic Arch:            Appears normal  Ventricles:            Appears normal         Ductal Arch:            Appears normal  Choroid Plexus:        Appears normal         Diaphragm:               Appears normal  Cerebellum:            Appears normal         Stomach:                Appears normal, left                                                                        sided  Posterior Fossa:       Appears normal         Abdomen:                Appears normal  Nuchal Fold:           Appears normal         Abdominal Wall:         Appears nml (cord                                                                        insert, abd wall)  Face:                  Appears normal         Cord Vessels:           Appears normal (3                         (orbits and profile)                           vessel cord)  Lips:                  Not well visualized    Kidneys:                Appear normal  Palate:                Not well visualized    Bladder:                Appears normal  Thoracic:              Appears normal         Spine:                  Appears normal  Heart:                 Not well visualized  Upper Extremities:      Appears normal  RVOT:                  Not well visualized    Lower Extremities:      Appears normal  Other:  Fetus appears to be a female. Nasal bone visualized. Technically          difficult due to fetal position. ---------------------------------------------------------------------- Cervix Uterus Adnexa  Cervix  Length:           3.75  cm.  Normal appearance by transabdominal scan. ---------------------------------------------------------------------- Impression  Single intrauterine pregnancy here for a detailed anatomy  due prior fetal growth restriction and elevated BMI.  Normal anatomy with measurements consistent with dates  There is good fetal movement and amniotic fluid volume  Suboptimal views of the fetal anatomy were obtained  secondary to fetal position.  Ms. Noreene Larsson has a normal low risk NIPS. ---------------------------------------------------------------------- Recommendations  Follow up growth in 4 weeks to complete the fetal anatomy  Fetal growth should be  scheduled at 28-32 weeks as well  given history FGR. ----------------------------------------------------------------------               Sander Nephew, MD Electronically Signed Final Report   10/12/2021 11:22 am ----------------------------------------------------------------------   Assessment and Plan:  Pregnancy: G3P1011 at 63w4d1. Supervision of other normal pregnancy, antepartum Good fetal movement  2. Rubella non-immune status, antepartum MMR post delivery  3. [redacted] weeks gestation of pregnancy  4. Cigarette smoker  5. History of IUGR (intrauterine growth retardation)  Current EFW 76%   Preterm labor symptoms and general obstetric precautions including but not limited to vaginal bleeding, contractions, leaking of fluid and fetal movement were reviewed in detail with the patient. I discussed the assessment and treatment plan with the patient. The patient was provided an opportunity to ask questions and all were answered. The patient agreed with the plan and demonstrated an understanding of the instructions. The patient was advised to call back or seek an in-person office evaluation/[facility]. Please refer to After Visit Summary for other counseling recommendations.   I provided 10 minutes of face-to-face time during this encounter.  No follow-ups on file.  Future Appointments  Date Time Provider DWeld 11/10/2021  9:30 AM [facility] 11/10/2021  9:45 AM [facility] 11/25/2021 11:15 AM STruett Mainland DO [facility] None  12/23/2021  8:35 AM SNehemiah Settle JTanna Savoy DO [facility] None    JCalhounfor WDe[facility] Company CLone [place]

## 2021-11-10 ENCOUNTER — Ambulatory Visit: Payer: Medicaid Other

## 2021-11-17 ENCOUNTER — Encounter: Payer: Self-pay | Admitting: *Deleted

## 2021-11-17 ENCOUNTER — Other Ambulatory Visit: Payer: Self-pay

## 2021-11-17 ENCOUNTER — Ambulatory Visit: Payer: Medicaid Other | Admitting: *Deleted

## 2021-11-17 ENCOUNTER — Ambulatory Visit: Payer: Medicaid Other | Attending: Maternal & Fetal Medicine

## 2021-11-17 VITALS — BP 113/64 | HR 105

## 2021-11-17 DIAGNOSIS — Z362 Encounter for other antenatal screening follow-up: Secondary | ICD-10-CM | POA: Diagnosis present

## 2021-11-17 DIAGNOSIS — Z3480 Encounter for supervision of other normal pregnancy, unspecified trimester: Secondary | ICD-10-CM

## 2021-11-17 DIAGNOSIS — O09899 Supervision of other high risk pregnancies, unspecified trimester: Secondary | ICD-10-CM | POA: Diagnosis present

## 2021-11-17 DIAGNOSIS — O99330 Smoking (tobacco) complicating pregnancy, unspecified trimester: Secondary | ICD-10-CM | POA: Insufficient documentation

## 2021-11-17 DIAGNOSIS — O09299 Supervision of pregnancy with other poor reproductive or obstetric history, unspecified trimester: Secondary | ICD-10-CM | POA: Insufficient documentation

## 2021-11-17 DIAGNOSIS — E669 Obesity, unspecified: Secondary | ICD-10-CM | POA: Diagnosis not present

## 2021-11-17 DIAGNOSIS — Z363 Encounter for antenatal screening for malformations: Secondary | ICD-10-CM | POA: Diagnosis not present

## 2021-11-17 DIAGNOSIS — O09892 Supervision of other high risk pregnancies, second trimester: Secondary | ICD-10-CM | POA: Insufficient documentation

## 2021-11-17 DIAGNOSIS — O99212 Obesity complicating pregnancy, second trimester: Secondary | ICD-10-CM | POA: Diagnosis not present

## 2021-11-17 DIAGNOSIS — Z2839 Other underimmunization status: Secondary | ICD-10-CM | POA: Diagnosis present

## 2021-11-17 DIAGNOSIS — Z3A23 23 weeks gestation of pregnancy: Secondary | ICD-10-CM

## 2021-11-21 ENCOUNTER — Other Ambulatory Visit: Payer: Self-pay

## 2021-11-21 DIAGNOSIS — O219 Vomiting of pregnancy, unspecified: Secondary | ICD-10-CM

## 2021-11-21 MED ORDER — ONDANSETRON 4 MG PO TBDP: 4.0000 mg | ORAL_TABLET | Freq: Three times a day (TID) | ORAL | 0 refills | Status: DC | PRN

## 2021-11-22 ENCOUNTER — Other Ambulatory Visit: Payer: Self-pay | Admitting: *Deleted

## 2021-11-22 DIAGNOSIS — O09899 Supervision of other high risk pregnancies, unspecified trimester: Secondary | ICD-10-CM

## 2021-11-22 DIAGNOSIS — Z6833 Body mass index (BMI) 33.0-33.9, adult: Secondary | ICD-10-CM

## 2021-11-22 DIAGNOSIS — O99332 Smoking (tobacco) complicating pregnancy, second trimester: Secondary | ICD-10-CM

## 2021-11-25 ENCOUNTER — Other Ambulatory Visit: Payer: Self-pay

## 2021-11-25 ENCOUNTER — Ambulatory Visit (INDEPENDENT_AMBULATORY_CARE_PROVIDER_SITE_OTHER): Payer: Medicaid Other | Admitting: Family Medicine

## 2021-11-25 VITALS — BP 119/78 | HR 97 | Wt 213.0 lb

## 2021-11-25 DIAGNOSIS — Z3480 Encounter for supervision of other normal pregnancy, unspecified trimester: Secondary | ICD-10-CM

## 2021-11-25 DIAGNOSIS — Z2839 Other underimmunization status: Secondary | ICD-10-CM

## 2021-11-25 DIAGNOSIS — O09899 Supervision of other high risk pregnancies, unspecified trimester: Secondary | ICD-10-CM

## 2021-11-25 NOTE — Progress Notes (Signed)
   PRENATAL VISIT NOTE  Subjective:  Shannon Ruiz is a 25 y.o. G3P1011 at 90w5dbeing seen today for ongoing prenatal care.  She is currently monitored for the following issues for this low-risk pregnancy and has Cigarette smoker; Anxiety disorder affecting pregnancy, antepartum; Rubella non-immune status, antepartum; Supervision of other normal pregnancy, antepartum; Obesity in pregnancy, antepartum; and History of IUGR (intrauterine growth retardation) and stillbirth, currently pregnant, second trimester on their problem list.  Patient reports no complaints.  Contractions: Not present. Vag. Bleeding: None.  Movement: Present. Denies leaking of fluid.   The following portions of the patient's history were reviewed and updated as appropriate: allergies, current medications, past family history, past medical history, past social history, past surgical history and problem list.   Objective:   Vitals:   11/25/21 1121  BP: 119/78  Pulse: 97  Weight: 213 lb (96.6 kg)    Fetal Status: Fetal Heart Rate (bpm): 145 Fundal Height: 24 cm Movement: Present     General:  Alert, oriented and cooperative. Patient is in no acute distress.  Skin: Skin is warm and dry. No rash noted.   Cardiovascular: Normal heart rate noted  Respiratory: Normal respiratory effort, no problems with respiration noted  Abdomen: Soft, gravid, appropriate for gestational age.  Pain/Pressure: Absent     Pelvic: Cervical exam deferred        Extremities: Normal range of motion.  Edema: None  Mental Status: Normal mood and affect. Normal behavior. Normal judgment and thought content.   Assessment and Plan:  Pregnancy: G3P1011 at 267w5d. Supervision of other normal pregnancy, antepartum FHT and FH normal\ Normal growth  2. Rubella non-immune status, antepartum MMR post delivery  Preterm labor symptoms and general obstetric precautions including but not limited to vaginal bleeding, contractions, leaking of fluid and fetal  movement were reviewed in detail with the patient. Please refer to After Visit Summary for other counseling recommendations.   No follow-ups on file.  Future Appointments  Date Time Provider DeMiramar12/28/2022 11:15 AM [facility]SE [facility]/28/2022 11:30 AM [facility]/29/2022  8:35 AM StTruett MainlandDO [facility] None  01/06/2022 11:15 AM StTruett MainlandDO [facility] None  01/20/2022 11:15 AM StTruett MainlandDO [facility] None  02/03/2022 11:15 AM StTruett MainlandDO [facility] None  02/17/2022 11:15 AM StTruett MainlandDO [facility] None    JaTruett MainlandDO

## 2021-12-22 ENCOUNTER — Ambulatory Visit: Payer: Medicaid Other | Admitting: *Deleted

## 2021-12-22 ENCOUNTER — Other Ambulatory Visit: Payer: Self-pay

## 2021-12-22 ENCOUNTER — Ambulatory Visit: Payer: Medicaid Other | Attending: Obstetrics and Gynecology

## 2021-12-22 VITALS — BP 128/77 | HR 110

## 2021-12-22 DIAGNOSIS — O99213 Obesity complicating pregnancy, third trimester: Secondary | ICD-10-CM

## 2021-12-22 DIAGNOSIS — O99332 Smoking (tobacco) complicating pregnancy, second trimester: Secondary | ICD-10-CM | POA: Diagnosis present

## 2021-12-22 DIAGNOSIS — O09899 Supervision of other high risk pregnancies, unspecified trimester: Secondary | ICD-10-CM

## 2021-12-22 DIAGNOSIS — Z3480 Encounter for supervision of other normal pregnancy, unspecified trimester: Secondary | ICD-10-CM | POA: Insufficient documentation

## 2021-12-22 DIAGNOSIS — E669 Obesity, unspecified: Secondary | ICD-10-CM | POA: Diagnosis not present

## 2021-12-22 DIAGNOSIS — O09293 Supervision of pregnancy with other poor reproductive or obstetric history, third trimester: Secondary | ICD-10-CM | POA: Diagnosis not present

## 2021-12-22 DIAGNOSIS — Z2839 Other underimmunization status: Secondary | ICD-10-CM | POA: Insufficient documentation

## 2021-12-22 DIAGNOSIS — O99333 Smoking (tobacco) complicating pregnancy, third trimester: Secondary | ICD-10-CM

## 2021-12-22 DIAGNOSIS — O09893 Supervision of other high risk pregnancies, third trimester: Secondary | ICD-10-CM | POA: Diagnosis not present

## 2021-12-22 DIAGNOSIS — Z6833 Body mass index (BMI) 33.0-33.9, adult: Secondary | ICD-10-CM | POA: Diagnosis present

## 2021-12-22 DIAGNOSIS — Z3A28 28 weeks gestation of pregnancy: Secondary | ICD-10-CM

## 2021-12-23 ENCOUNTER — Encounter: Payer: Medicaid Other | Admitting: Family Medicine

## 2021-12-24 ENCOUNTER — Encounter: Payer: Medicaid Other | Admitting: Family Medicine

## 2021-12-26 NOTE — L&D Delivery Note (Addendum)
OB/GYN Faculty Practice Delivery Note ? ?Shannon Ruiz is a 26 y.o. G3P1011 s/p SVD at [redacted]w[redacted]d. She was admitted for eIOL, developed gHTN.  ? ?ROM: 5h 78m with clear fluid ?GBS Status: Negative ?Maximum Maternal Temperature: 98.7 ? ?Labor Progress: ?Presented for elective IOL. Received pitocin and progressed to AROM with clear fluid. Pitocin was continued after AROM and patient progressed to complete.  ? ?Delivery Date/Time: 3/20 ?Delivery: Called to room and patient was complete and pushing. Head delivered ROA. Loose nuchal cord present x1 which was reduced. Shoulder and body delivered in usual fashion. Infant with spontaneous cry, placed on mother's abdomen, dried and stimulated. Cord clamped x 2 after 1-minute delay, and cut by FOB. Cord blood drawn. Placenta delivered spontaneously with gentle cord traction. Fundus firm with massage and Pitocin. Labia, perineum, vagina, and cervix inspected inspected with a left periurethral laceration/abrasion which was hemostatic and not repaired.  ? ?Placenta: intact, 3-vessel cord, sent to L&D ?Complications: None ?Lacerations: Left periurethral abrasion, hemostatic, not repaired ?EBL: 250 cc ?Analgesia: Epidural ? ?Postpartum Planning ?[ ]  message to sent to schedule follow-up  ?[ ]  vaccines UTD ? ?Infant: Vigorous Boy  APGARs 9, 9  Weight pending ? ?Daniel Nones, MD ?FM PGY-1 ?03/15/2022, 12:09 AM ? ? ? ? ? ? ?The above was performed under my direct supervision and guidance.  ? ?

## 2021-12-29 ENCOUNTER — Other Ambulatory Visit: Payer: Self-pay

## 2021-12-29 ENCOUNTER — Other Ambulatory Visit: Payer: Medicaid Other

## 2021-12-29 DIAGNOSIS — O99210 Obesity complicating pregnancy, unspecified trimester: Secondary | ICD-10-CM

## 2021-12-29 DIAGNOSIS — Z3A29 29 weeks gestation of pregnancy: Secondary | ICD-10-CM

## 2021-12-29 DIAGNOSIS — Z3480 Encounter for supervision of other normal pregnancy, unspecified trimester: Secondary | ICD-10-CM

## 2021-12-29 NOTE — Progress Notes (Signed)
Patient sent to lab for blood work. Senica Crall RN 

## 2021-12-30 LAB — CBC
Hematocrit: 32.7 % — ABNORMAL LOW (ref 34.0–46.6)
Hemoglobin: 11 g/dL — ABNORMAL LOW (ref 11.1–15.9)
MCH: 28.7 pg (ref 26.6–33.0)
MCHC: 33.6 g/dL (ref 31.5–35.7)
MCV: 85 fL (ref 79–97)
Platelets: 435 10*3/uL (ref 150–450)
RBC: 3.83 x10E6/uL (ref 3.77–5.28)
RDW: 13.2 % (ref 11.7–15.4)
WBC: 16.6 10*3/uL — ABNORMAL HIGH (ref 3.4–10.8)

## 2021-12-30 LAB — GLUCOSE TOLERANCE, 2 HOURS W/ 1HR
Glucose, 1 hour: 121 mg/dL (ref 70–179)
Glucose, 2 hour: 104 mg/dL (ref 70–152)
Glucose, Fasting: 90 mg/dL (ref 70–91)

## 2021-12-30 LAB — RPR: RPR Ser Ql: NONREACTIVE

## 2021-12-30 LAB — HIV ANTIBODY (ROUTINE TESTING W REFLEX): HIV Screen 4th Generation wRfx: NONREACTIVE

## 2022-01-01 IMAGING — US US MFM UA CORD DOPPLER
1 series · 14 of 28 positions shown · non-contrast
Comparison: none

[Series 1: us mfm ua cord doppler · 14 of 31 slices shown]
[im 2/31]
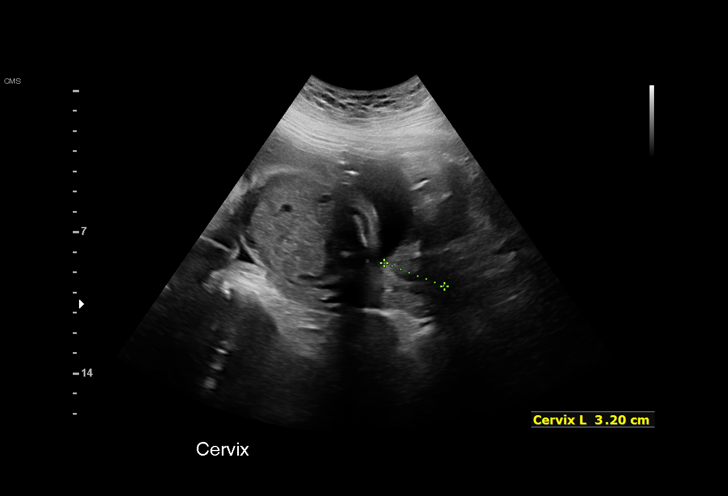
[im 4/31]
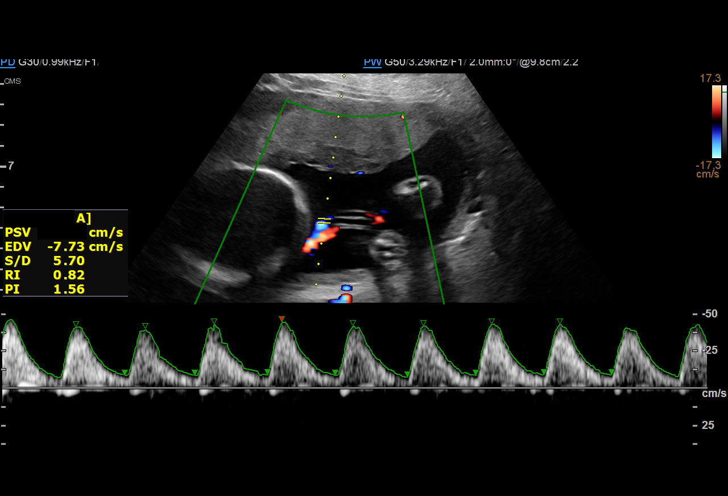
[im 6/31]
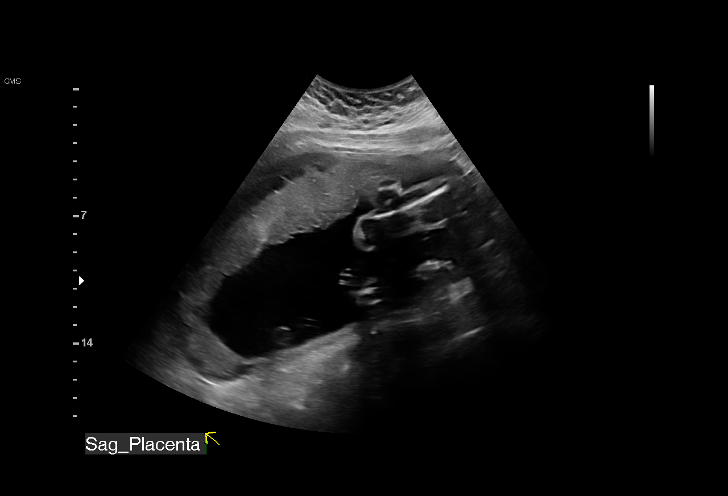
[im 8/31]
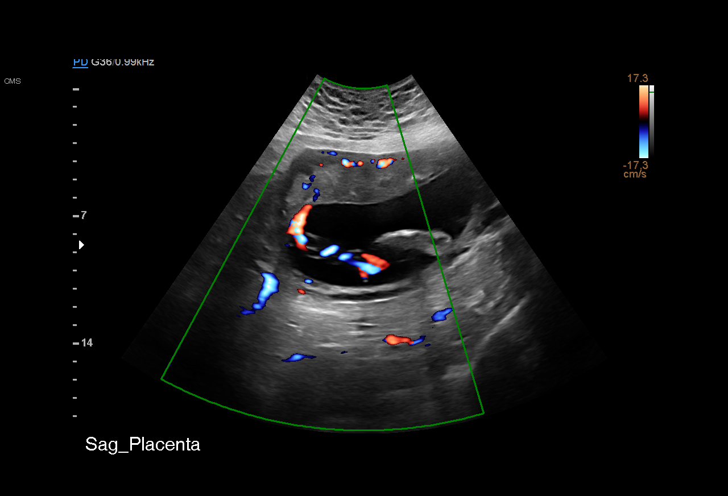
[im 11/31]
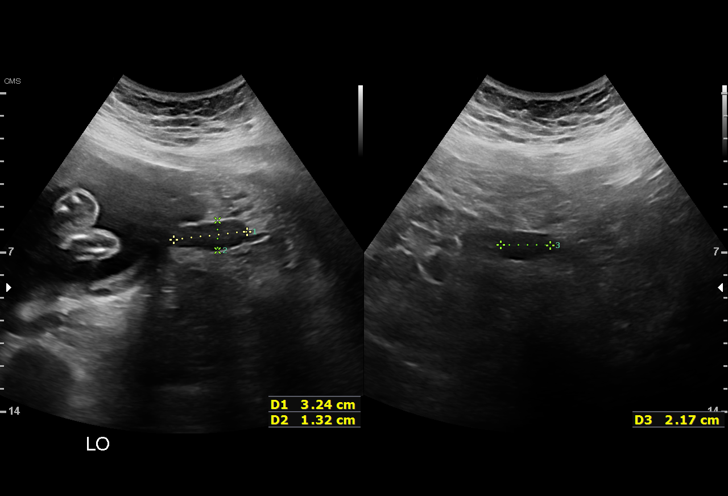
[im 13/31]
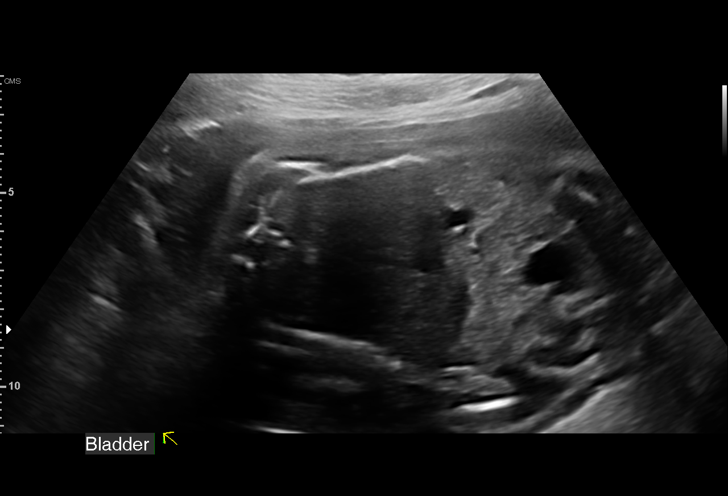
[im 15/31]
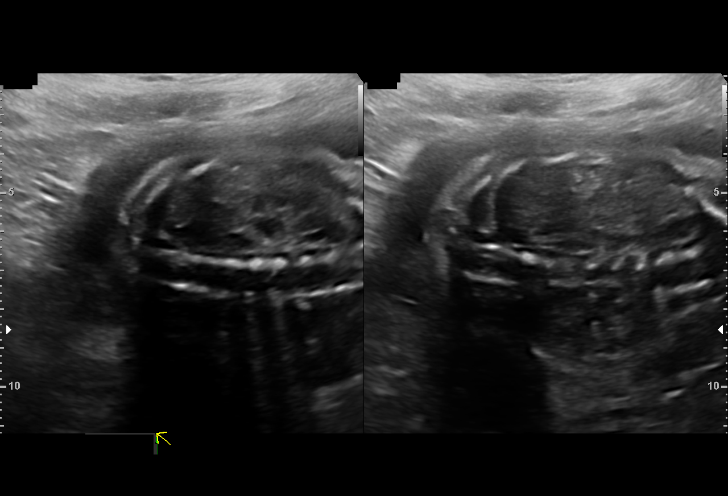
[im 17/31]
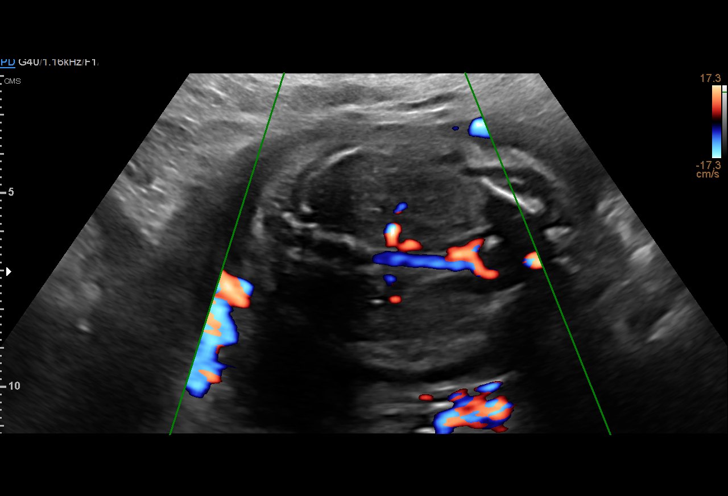
[im 19/31]
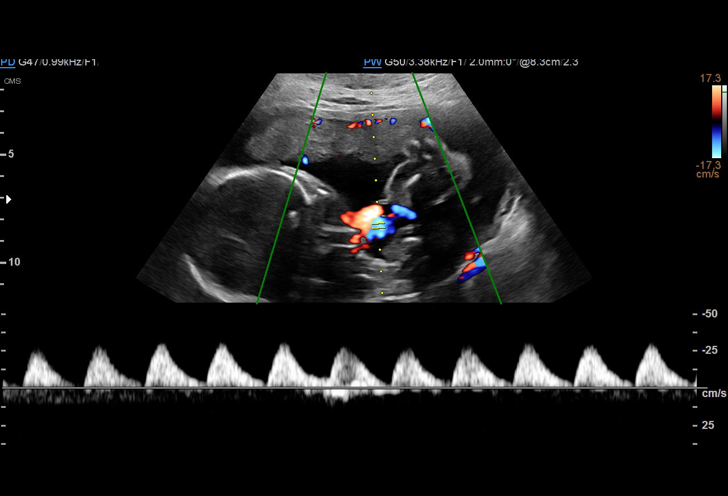
[im 22/31]
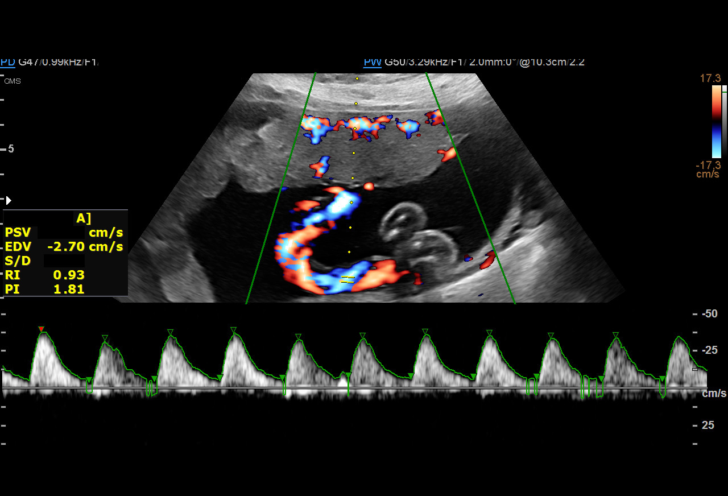
[im 24/31]
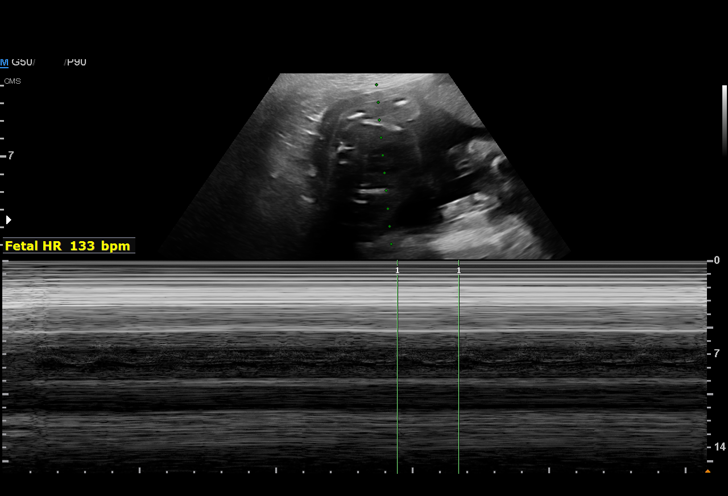
[im 26/31]
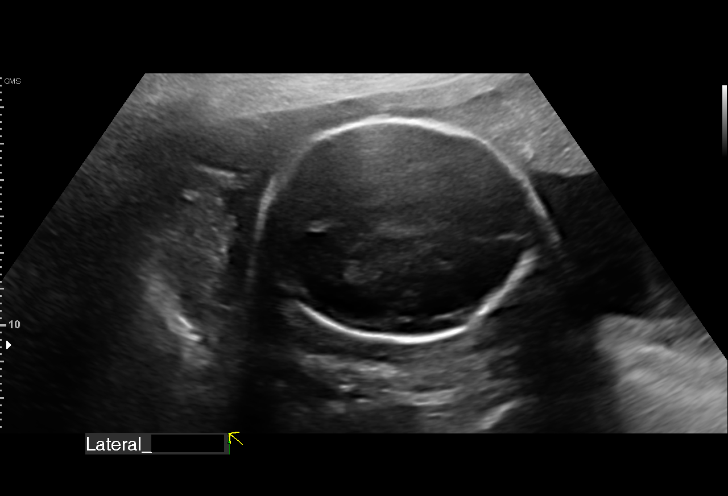
[im 28/31]
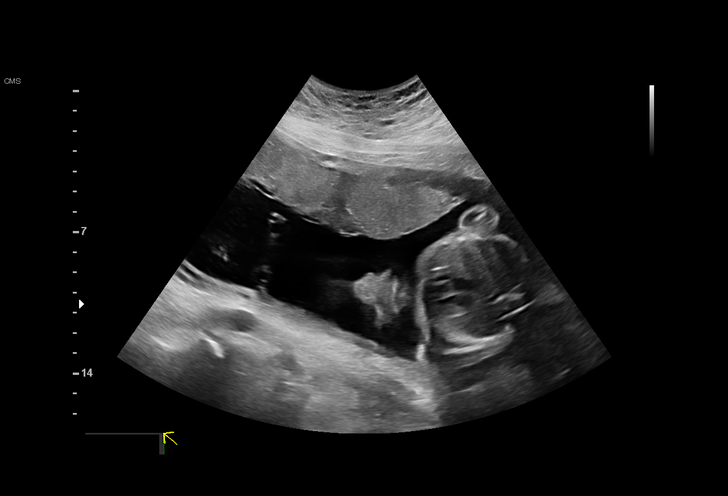
[im 31/31]
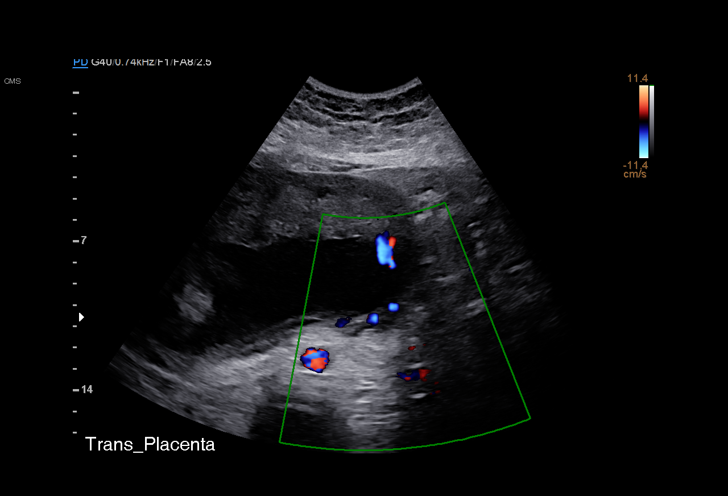

[14 of 28 positions shown; findings below may reference images not displayed]

[REDACTED]
                   ERMOGENOUS CNM

 1  US MFM UA CORD DOPPLER                76820.02    TIGER
                                                      WARAA GADAAN

Indications

 Encounter for other antenatal screening
 follow-up
 Maternal care for known or suspected poor
 fetal growth, second trimester, not applicable
 or unspecified IUGR
 Marginal insertion of umbilical cord affecting
 management of mother in second trimester
 Fetal cardiac anomaly affecting pregnancy,
 antepartum (VSD)
 Obesity complicating pregnancy, second
 trimester (Pre-G BMI 34)
 Tobacco use complicating pregnancy,
 second trimester
 LR Panorama
 25 weeks gestation of pregnancy
Fetal Evaluation

 Num Of Fetuses:         1
 Fetal Heart Rate(bpm):  133
 Cardiac Activity:       Observed
 Presentation:           Breech
 Placenta:               Anterior Fundal
 P. Cord Insertion:      Marginal insertion
 Amniotic Fluid
 AFI FV:      Within normal limits

                             Largest Pocket(cm)
                             5.
Biometry

 LV:        5.6  mm
OB History

 Gravidity:    2         Term:   0         SAB:   10
Gestational Age

 LMP:           25w 5d        Date:  04/24/20                 EDD:   01/29/21
 Clinical EDD:  25w 5d                                        EDD:   01/29/21
 Best:          25w 5d     Det. By:  LMP  (04/24/20)          EDD:   01/29/21
Doppler - Fetal Vessels

 Umbilical Artery
  S/D     %tile      RI    %tile                             ADFV    RDFV
 13.71   > 97.5    0.93   > 97.5                               Yes      No

Cervix Uterus Adnexa

 Cervix
 Length:            3.2  cm.
 Normal appearance by transabdominal scan.

 Uterus
 No abnormality visualized.

 Right Ovary
 Not visualized.

 Left Ovary
 Within normal limits.

 Cul De Sac
 No free fluid seen.

 Adnexa
 No abnormality visualized.
Impression

 Fetal growth restriction. Marginal cord insertion. On
 ultrasound performed on 10/07/20, the estimated fetal weight
 was at the 4th percentile.
 Amniotic fluid is normal and good fetal activity is seen.
 Umbilical artery Doppler showed intermittent absent-end-
 diastolic flow. NST is reactive.
 I explained the significance of findings and the indications for
 close fetal surveillance.
 I discussed antenatal corticosteroids and its benefits. We
 would recommend if Doppler studies worsen (persistent
 absent flow).
Recommendations

 -Weekly UA Doppler and NST to continue.
 -Fetal growth assessment next week.
                 Tiger, Yoel

## 2022-01-06 ENCOUNTER — Encounter: Payer: Medicaid Other | Admitting: Family Medicine

## 2022-01-06 NOTE — Progress Notes (Signed)
Pt was initially able to be contacted and then signed off the call. We called her twice and then I sent a direct link to try to re-establish an appointment but we were unable to reach the patient to discuss. For this appointment, I had planned to cover the following:   1. Cigarette smoker Current tobacco use  2. Anxiety disorder affecting pregnancy, antepartum  3. Supervision of other normal pregnancy, antepartum Tdap  Discuss birth control and feeding plan:  4. History of IUGR (intrauterine growth retardation) and stillbirth, currently pregnant, second trimester 12/28 growth was normal at 93%ile - given excellent growth no plan for return, but if any concern or uncertainty, we will have low threshold to send for third trimester growht.   Radene Gunning, MD

## 2022-01-07 ENCOUNTER — Encounter: Payer: Self-pay | Admitting: Obstetrics and Gynecology

## 2022-01-07 ENCOUNTER — Telehealth: Payer: Self-pay

## 2022-01-07 ENCOUNTER — Telehealth (INDEPENDENT_AMBULATORY_CARE_PROVIDER_SITE_OTHER): Payer: Medicaid Other | Admitting: Obstetrics and Gynecology

## 2022-01-07 DIAGNOSIS — O09899 Supervision of other high risk pregnancies, unspecified trimester: Secondary | ICD-10-CM

## 2022-01-07 DIAGNOSIS — Z3480 Encounter for supervision of other normal pregnancy, unspecified trimester: Secondary | ICD-10-CM

## 2022-01-07 DIAGNOSIS — F17210 Nicotine dependence, cigarettes, uncomplicated: Secondary | ICD-10-CM

## 2022-01-07 DIAGNOSIS — F419 Anxiety disorder, unspecified: Secondary | ICD-10-CM

## 2022-01-07 DIAGNOSIS — O99340 Other mental disorders complicating pregnancy, unspecified trimester: Secondary | ICD-10-CM

## 2022-01-07 DIAGNOSIS — O09292 Supervision of pregnancy with other poor reproductive or obstetric history, second trimester: Secondary | ICD-10-CM

## 2022-01-07 DIAGNOSIS — O99210 Obesity complicating pregnancy, unspecified trimester: Secondary | ICD-10-CM

## 2022-01-07 DIAGNOSIS — Z2839 Other underimmunization status: Secondary | ICD-10-CM

## 2022-01-07 NOTE — Telephone Encounter (Signed)
Attempted to reach patient to get back on her my chart video visit. Kathrene Alu RN

## 2022-01-19 ENCOUNTER — Telehealth: Payer: Self-pay

## 2022-01-19 NOTE — Telephone Encounter (Signed)
-----   Message from Marti Sleigh, [place] sent at 01/19/2022  9:29 AM EST ----- Regarding: Request Call Back Complaints of acid reflux and nothing is working for that she's taking over the counter.

## 2022-01-20 ENCOUNTER — Other Ambulatory Visit: Payer: Self-pay

## 2022-01-20 ENCOUNTER — Ambulatory Visit (INDEPENDENT_AMBULATORY_CARE_PROVIDER_SITE_OTHER): Payer: Medicaid Other | Admitting: Family Medicine

## 2022-01-20 VITALS — BP 135/86 | HR 97 | Wt 220.0 lb

## 2022-01-20 DIAGNOSIS — O09899 Supervision of other high risk pregnancies, unspecified trimester: Secondary | ICD-10-CM

## 2022-01-20 DIAGNOSIS — Z3A32 32 weeks gestation of pregnancy: Secondary | ICD-10-CM

## 2022-01-20 DIAGNOSIS — O09292 Supervision of pregnancy with other poor reproductive or obstetric history, second trimester: Secondary | ICD-10-CM

## 2022-01-20 DIAGNOSIS — Z2839 Other underimmunization status: Secondary | ICD-10-CM

## 2022-01-20 DIAGNOSIS — Z3480 Encounter for supervision of other normal pregnancy, unspecified trimester: Secondary | ICD-10-CM

## 2022-01-20 MED ORDER — FAMOTIDINE 20 MG PO TABS
20.0000 mg | ORAL_TABLET | Freq: Two times a day (BID) | ORAL | 3 refills | Status: DC
Start: 2022-01-20 — End: 2022-03-15

## 2022-01-20 NOTE — Progress Notes (Signed)
° °  PRENATAL VISIT NOTE  Subjective:  Shannon Ruiz is a 26 y.o. G3P1011 at [redacted]w[redacted]d being seen today for ongoing prenatal care.  She is currently monitored for the following issues for this high-risk pregnancy and has Cigarette smoker; Anxiety disorder affecting pregnancy, antepartum; Rubella non-immune status, antepartum; Supervision of other normal pregnancy, antepartum; Obesity in pregnancy, antepartum; and History of IUGR (intrauterine growth retardation) and stillbirth, currently pregnant, second trimester on their problem list.  Patient reports no complaints.  Contractions: Irritability. Vag. Bleeding: None.  Movement: Present. Denies leaking of fluid.   The following portions of the patient's history were reviewed and updated as appropriate: allergies, current medications, past family history, past medical history, past social history, past surgical history and problem list.   Objective:   Vitals:   01/20/22 1104  BP: 135/86  Pulse: 97  Weight: 220 lb (99.8 kg)    Fetal Status: Fetal Heart Rate (bpm): 140 Fundal Height: 32 cm Movement: Present  Presentation: Vertex  General:  Alert, oriented and cooperative. Patient is in no acute distress.  Skin: Skin is warm and dry. No rash noted.   Cardiovascular: Normal heart rate noted  Respiratory: Normal respiratory effort, no problems with respiration noted  Abdomen: Soft, gravid, appropriate for gestational age.  Pain/Pressure: Present     Pelvic: Cervical exam deferred        Extremities: Normal range of motion.  Edema: None  Mental Status: Normal mood and affect. Normal behavior. Normal judgment and thought content.   Assessment and Plan:  Pregnancy: G3P1011 at [redacted]w[redacted]d 1. Supervision of other normal pregnancy, antepartum FHT and FH normal  2. [redacted] weeks gestation of pregnancy   3. Rubella non-immune status, antepartum MMR postpartum  4. History of IUGR (intrauterine growth retardation) and stillbirth, currently pregnant, second  trimester Normal growth  Preterm labor symptoms and general obstetric precautions including but not limited to vaginal bleeding, contractions, leaking of fluid and fetal movement were reviewed in detail with the patient. Please refer to After Visit Summary for other counseling recommendations.   No follow-ups on file.  Future Appointments  Date Time Provider [place]  02/03/2022 11:15 AM Truett Mainland, DO [facility] None  02/17/2022 11:15 AM Truett Mainland, DO [facility] None  02/24/2022 10:15 AM Truett Mainland, DO [facility] None  03/03/2022 10:15 AM Truett Mainland, DO [facility] None  03/10/2022 10:35 AM Truett Mainland, DO [facility] None    Truett Mainland, DO

## 2022-01-20 NOTE — Progress Notes (Signed)
Patient complaining of acid reflux. [name] RN

## 2022-02-03 ENCOUNTER — Other Ambulatory Visit: Payer: Self-pay

## 2022-02-03 ENCOUNTER — Ambulatory Visit (INDEPENDENT_AMBULATORY_CARE_PROVIDER_SITE_OTHER): Payer: Medicaid Other | Admitting: Family Medicine

## 2022-02-03 VITALS — BP 115/84 | HR 103 | Wt 223.0 lb

## 2022-02-03 DIAGNOSIS — O09292 Supervision of pregnancy with other poor reproductive or obstetric history, second trimester: Secondary | ICD-10-CM

## 2022-02-03 DIAGNOSIS — O99340 Other mental disorders complicating pregnancy, unspecified trimester: Secondary | ICD-10-CM

## 2022-02-03 DIAGNOSIS — Z3A34 34 weeks gestation of pregnancy: Secondary | ICD-10-CM

## 2022-02-03 DIAGNOSIS — O99210 Obesity complicating pregnancy, unspecified trimester: Secondary | ICD-10-CM

## 2022-02-03 DIAGNOSIS — F17210 Nicotine dependence, cigarettes, uncomplicated: Secondary | ICD-10-CM

## 2022-02-03 DIAGNOSIS — F419 Anxiety disorder, unspecified: Secondary | ICD-10-CM

## 2022-02-03 DIAGNOSIS — Z3480 Encounter for supervision of other normal pregnancy, unspecified trimester: Secondary | ICD-10-CM

## 2022-02-03 DIAGNOSIS — O09899 Supervision of other high risk pregnancies, unspecified trimester: Secondary | ICD-10-CM

## 2022-02-03 DIAGNOSIS — Z2839 Other underimmunization status: Secondary | ICD-10-CM

## 2022-02-03 NOTE — Progress Notes (Signed)
° °  PRENATAL VISIT NOTE  Subjective:  Shannon Ruiz is a 26 y.o. G3P1011 at 51w5dbeing seen today for ongoing prenatal care.  She is currently monitored for the following issues for this high-risk pregnancy and has Cigarette smoker; Anxiety disorder affecting pregnancy, antepartum; Rubella non-immune status, antepartum; Supervision of other normal pregnancy, antepartum; Obesity in pregnancy, antepartum; and History of IUGR (intrauterine growth retardation) and stillbirth, currently pregnant, second trimester on their problem list.  Patient reports no complaints.  Contractions: Irritability. Vag. Bleeding: None.  Movement: Present. Denies leaking of fluid.   The following portions of the patient's history were reviewed and updated as appropriate: allergies, current medications, past family history, past medical history, past social history, past surgical history and problem list.   Objective:   Vitals:   02/03/22 1104  BP: 115/84  Pulse: (!) 103  Weight: 223 lb (101.2 kg)    Fetal Status: Fetal Heart Rate (bpm): 130 Fundal Height: 34 cm Movement: Present  Presentation: Vertex  General:  Alert, oriented and cooperative. Patient is in no acute distress.  Skin: Skin is warm and dry. No rash noted.   Cardiovascular: Normal heart rate noted  Respiratory: Normal respiratory effort, no problems with respiration noted  Abdomen: Soft, gravid, appropriate for gestational age.  Pain/Pressure: Present     Pelvic: Cervical exam deferred        Extremities: Normal range of motion.  Edema: None  Mental Status: Normal mood and affect. Normal behavior. Normal judgment and thought content.   Assessment and Plan:  Pregnancy: G3P1011 at 312w5d. [redacted] weeks gestation of pregnancy  2. Supervision of other normal pregnancy, antepartum FHT and FH normal  3. Obesity in pregnancy, antepartum  4. Cigarette smoker Not currently smoking  5. Anxiety disorder affecting pregnancy, antepartum  6. Rubella  non-immune status, antepartum MMR post delivery   7. History of IUGR (intrauterine growth retardation) and stillbirth, currently pregnant, second trimester Normal growth  Preterm labor symptoms and general obstetric precautions including but not limited to vaginal bleeding, contractions, leaking of fluid and fetal movement were reviewed in detail with the patient. Please refer to After Visit Summary for other counseling recommendations.   No follow-ups on file.  Future Appointments  Date Time Provider DeMazie2/23/2023 11:15 AM StTruett MainlandDO [facility] None  02/24/2022 10:15 AM StTruett MainlandDO [facility] None  03/03/2022 10:15 AM StTruett MainlandDO [facility] None  03/10/2022 10:35 AM StTruett MainlandDO [facility] None    JaTruett MainlandDO

## 2022-02-14 ENCOUNTER — Encounter: Payer: Self-pay | Admitting: Family Medicine

## 2022-02-17 ENCOUNTER — Ambulatory Visit (INDEPENDENT_AMBULATORY_CARE_PROVIDER_SITE_OTHER): Payer: Medicaid Other | Admitting: Family Medicine

## 2022-02-17 ENCOUNTER — Other Ambulatory Visit: Payer: Self-pay

## 2022-02-17 ENCOUNTER — Other Ambulatory Visit (HOSPITAL_COMMUNITY)
Admission: RE | Admit: 2022-02-17 | Discharge: 2022-02-17 | Disposition: A | Discharge status: Home / Self Care | Payer: Medicaid Other | Source: Ambulatory Visit | Attending: Family Medicine | Admitting: Family Medicine

## 2022-02-17 VITALS — BP 135/76 | HR 82 | Wt 223.0 lb

## 2022-02-17 DIAGNOSIS — Z3480 Encounter for supervision of other normal pregnancy, unspecified trimester: Secondary | ICD-10-CM | POA: Insufficient documentation

## 2022-02-17 DIAGNOSIS — Z3A36 36 weeks gestation of pregnancy: Secondary | ICD-10-CM | POA: Insufficient documentation

## 2022-02-17 DIAGNOSIS — O09899 Supervision of other high risk pregnancies, unspecified trimester: Secondary | ICD-10-CM

## 2022-02-17 DIAGNOSIS — Z2839 Other underimmunization status: Secondary | ICD-10-CM

## 2022-02-17 LAB — OB RESULTS CONSOLE GC/CHLAMYDIA: Gonorrhea: NEGATIVE

## 2022-02-17 NOTE — Progress Notes (Signed)
PRENATAL VISIT NOTE  Subjective:  Shannon Ruiz is a 26 y.o. G3P1011 at 68w5dbeing seen today for ongoing prenatal care.  She is currently monitored for the following issues for this low-risk pregnancy and has Cigarette smoker; Anxiety disorder affecting pregnancy, antepartum; Rubella non-immune status, antepartum; Supervision of other normal pregnancy, antepartum; Obesity in pregnancy, antepartum; and History of IUGR (intrauterine growth retardation) and stillbirth, currently pregnant, second trimester on their problem list.  Patient reports no complaints.  Contractions: Irritability. Vag. Bleeding: None.  Movement: Present. Denies leaking of fluid.   The following portions of the patient's history were reviewed and updated as appropriate: allergies, current medications, past family history, past medical history, past social history, past surgical history and problem list.   Objective:   Vitals:   02/17/22 1113  BP: 135/76  Pulse: 82  Weight: 223 lb (101.2 kg)    Fetal Status: Fetal Heart Rate (bpm): 152 Fundal Height: 36 cm Movement: Present  Presentation: Vertex  General:  Alert, oriented and cooperative. Patient is in no acute distress.  Skin: Skin is warm and dry. No rash noted.   Cardiovascular: Normal heart rate noted  Respiratory: Normal respiratory effort, no problems with respiration noted  Abdomen: Soft, gravid, appropriate for gestational age.  Pain/Pressure: Present     Pelvic: Cervical exam performed in the presence of a chaperone Dilation: 1.5 Effacement (%): 50 Station: -3  Extremities: Normal range of motion.  Edema: None  Mental Status: Normal mood and affect. Normal behavior. Normal judgment and thought content.   Assessment and Plan:  Pregnancy: G3P1011 at 362w5d. Supervision of other normal pregnancy, antepartum - Culture, beta strep (group b only) - [facility]. Rubella non-immune status, antepartum MMR post  delivery  3. [redacted] weeks gestation of pregnancy  - Culture, beta strep (group b only) - GC[facility]lam[facility] probe [facility] BPreterm labor symptoms and general obstetric precautions including but not limited to vaginal bleeding, contractions, leaking of fluid and fetal movement were reviewed in detail with the patient. Please refer to After Visit Summary for other counseling recommendations.   No follow-ups on file.  Future Appointments  Date Time Provider [place]3/01/2022 10:15 AM StTruett MainlandDO [facility] None  03/03/2022 10:15 AM StTruett MainlandDO [facility] None  03/10/2022 10:35 AM StNehemiah SettleaTanna SavoyDO [facility] None    JaTruett MainlandDO

## 2022-02-17 NOTE — Progress Notes (Signed)
Patient complaining of lots of pressure.

## 2022-02-18 LAB — GC/CHLAMYDIA PROBE AMP (~~LOC~~) NOT AT ARMC
Chlamydia: NEGATIVE
Comment: NEGATIVE
Comment: NORMAL
Neisseria Gonorrhea: NEGATIVE

## 2022-02-21 LAB — CULTURE, BETA STREP (GROUP B ONLY): Strep Gp B Culture: NEGATIVE

## 2022-02-24 ENCOUNTER — Ambulatory Visit (INDEPENDENT_AMBULATORY_CARE_PROVIDER_SITE_OTHER): Payer: Medicaid Other | Admitting: Family Medicine

## 2022-02-24 ENCOUNTER — Telehealth: Payer: Self-pay | Admitting: General Practice

## 2022-02-24 ENCOUNTER — Other Ambulatory Visit: Payer: Self-pay

## 2022-02-24 VITALS — BP 131/84 | HR 100 | Wt 225.0 lb

## 2022-02-24 DIAGNOSIS — Z3480 Encounter for supervision of other normal pregnancy, unspecified trimester: Secondary | ICD-10-CM

## 2022-02-24 DIAGNOSIS — Z3A37 37 weeks gestation of pregnancy: Secondary | ICD-10-CM

## 2022-02-24 DIAGNOSIS — Z2839 Other underimmunization status: Secondary | ICD-10-CM

## 2022-02-24 DIAGNOSIS — O09899 Supervision of other high risk pregnancies, unspecified trimester: Secondary | ICD-10-CM

## 2022-02-24 DIAGNOSIS — R030 Elevated blood-pressure reading, without diagnosis of hypertension: Secondary | ICD-10-CM

## 2022-02-24 DIAGNOSIS — O09292 Supervision of pregnancy with other poor reproductive or obstetric history, second trimester: Secondary | ICD-10-CM

## 2022-02-24 NOTE — Progress Notes (Signed)
? ?  PRENATAL VISIT NOTE ? ?Subjective:  ?Shannon Ruiz is a 26 y.o. G3P1011 at 39w5dbeing seen today for ongoing prenatal care.  She is currently monitored for the following issues for this high-risk pregnancy and has Cigarette smoker; Anxiety disorder affecting pregnancy, antepartum; Rubella non-immune status, antepartum; Supervision of other normal pregnancy, antepartum; Obesity in pregnancy, antepartum; and History of IUGR (intrauterine growth retardation) and stillbirth, currently pregnant, second trimester on their problem list. ? ?Patient reports  pelvic pressure with pubic bone pain .  Contractions: Irritability. Vag. Bleeding: Scant.  Movement: Present. Denies leaking of fluid.  ? ?The following portions of the patient's history were reviewed and updated as appropriate: allergies, current medications, past family history, past medical history, past social history, past surgical history and problem list.  ? ?Objective:  ? ?Vitals:  ? 02/24/22 1016 02/24/22 1019  ?BP: (!) 146/90 131/84  ?Pulse: (!) 102 100  ?Weight: 225 lb (102.1 kg)   ? ? ?Fetal Status: Fetal Heart Rate (bpm): 120   Movement: Present    ? ?General:  Alert, oriented and cooperative. Patient is in no acute distress.  ?Skin: Skin is warm and dry. No rash noted.   ?Cardiovascular: Normal heart rate noted  ?Respiratory: Normal respiratory effort, no problems with respiration noted  ?Abdomen: Soft, gravid, appropriate for gestational age.  Pain/Pressure: Present     ?Pelvic: Cervical exam performed in the presence of a chaperone        ?Extremities: Normal range of motion.  Edema: None  ?Mental Status: Normal mood and affect. Normal behavior. Normal judgment and thought content.  ? ?Assessment and Plan:  ?Pregnancy: G3P1011 at 338w5d1. [redacted] weeks gestation of pregnancy ? ?2. Supervision of other normal pregnancy, antepartum ?FHT and FH normal ? ?3. History of IUGR (intrauterine growth retardation) and stillbirth, currently pregnant, second  trimester ?Appropriate growth ? ?4. Rubella non-immune status, antepartum ?MMR post delivery ? ?5. Elevated BP without diagnosis of hypertension ?NST today ?CMP, CBC, Urine protein creatine ratio. ?Return Monday for NV - NST and BP check. ? ?Term labor symptoms and general obstetric precautions including but not limited to vaginal bleeding, contractions, leaking of fluid and fetal movement were reviewed in detail with the patient. ?Please refer to After Visit Summary for other counseling recommendations.  ? ?No follow-ups on file. ? ?Future Appointments  ?Date Time Provider [place]?03/03/2022 10:15 AM StTruett MainlandDO [facility] None  ?03/10/2022 10:35 AM StNehemiah SettleaTanna SavoyDO [facility] None  ? ? ?JaTruett MainlandDO ?

## 2022-02-24 NOTE — Telephone Encounter (Signed)
Left message on VM for patient to contact our office to schedule non stress and blood pressure check for Monday, 02/28/2022 per Dr. Nehemiah Settle. ?

## 2022-02-25 LAB — PROTEIN / CREATININE RATIO, URINE
Creatinine, Urine: 85.8 mg/dL
Protein, Ur: 21.5 mg/dL
Protein/Creat Ratio: 251 mg/g creat — ABNORMAL HIGH (ref 0–200)

## 2022-02-28 ENCOUNTER — Other Ambulatory Visit: Payer: Self-pay

## 2022-02-28 ENCOUNTER — Encounter: Payer: Self-pay | Admitting: Obstetrics and Gynecology

## 2022-02-28 ENCOUNTER — Ambulatory Visit (INDEPENDENT_AMBULATORY_CARE_PROVIDER_SITE_OTHER): Payer: Medicaid Other | Admitting: Obstetrics and Gynecology

## 2022-02-28 VITALS — BP 128/74 | HR 90 | Wt 226.0 lb

## 2022-02-28 DIAGNOSIS — Z3480 Encounter for supervision of other normal pregnancy, unspecified trimester: Secondary | ICD-10-CM

## 2022-02-28 DIAGNOSIS — O09899 Supervision of other high risk pregnancies, unspecified trimester: Secondary | ICD-10-CM

## 2022-02-28 DIAGNOSIS — Z3A38 38 weeks gestation of pregnancy: Secondary | ICD-10-CM

## 2022-02-28 DIAGNOSIS — O99013 Anemia complicating pregnancy, third trimester: Secondary | ICD-10-CM

## 2022-02-28 DIAGNOSIS — Z2839 Other underimmunization status: Secondary | ICD-10-CM

## 2022-02-28 DIAGNOSIS — Z0130 Encounter for examination of blood pressure without abnormal findings: Secondary | ICD-10-CM

## 2022-02-28 NOTE — Progress Notes (Signed)
? ?  PRENATAL VISIT NOTE ? ?Subjective:  ?Shannon Ruiz is a 26 y.o. G3P1011 at 41w2dbeing seen today for ongoing prenatal care.  She is currently monitored for the following issues for this low-risk pregnancy and has Cigarette smoker; Anxiety disorder affecting pregnancy, antepartum; Rubella non-immune status, antepartum; Supervision of other normal pregnancy, antepartum; Obesity in pregnancy, antepartum; and History of IUGR (intrauterine growth retardation) and stillbirth, currently pregnant, second trimester on their problem list. ? ?Patient reports no complaints.  Contractions: Irregular. Vag. Bleeding: None.  Movement: Present. Denies leaking of fluid.  ? ?The following portions of the patient's history were reviewed and updated as appropriate: allergies, current medications, past family history, past medical history, past social history, past surgical history and problem list.  ? ?Objective:  ? ?Vitals:  ? 02/28/22 0927  ?BP: 128/74  ?Pulse: 90  ?Weight: 226 lb (102.5 kg)  ? ? ?Fetal Status:     Movement: Present    ? ?General:  Alert, oriented and cooperative. Patient is in no acute distress.  ?Skin: Skin is warm and dry. No rash noted.   ?Cardiovascular: Normal heart rate noted  ?Respiratory: Normal respiratory effort, no problems with respiration noted  ?Abdomen: Soft, gravid, appropriate for gestational age.  Pain/Pressure: Present     ?Pelvic: Cervical exam deferred        ?Extremities: Normal range of motion.  Edema: None  ?Mental Status: Normal mood and affect. Normal behavior. Normal judgment and thought content.  ? ?NST ?FHT: 130. Moderate variability, no decels, + accels ?Toco: occasional contractions ? ?Assessment and Plan:  ?Pregnancy: G3P1011 at 329w2d1. [redacted] weeks gestation of pregnancy ?- Reviewed at this time, BP normal so no indication for delivery ?- P/C ratio 251, reviewed 300 is upper limit of normal. She will do remainder of labs today ? ?2. BP check ?- Normal today, reassurance given but  reviewed continuum of change in third trimester.  ? ?3. Supervision of other normal pregnancy, antepartum ?- She has routine appointment later this week.  ? ?4. Rubella non-immune status, antepartum ?- MMR after delivery ? ?Term labor symptoms and general obstetric precautions including but not limited to vaginal bleeding, contractions, leaking of fluid and fetal movement were reviewed in detail with the patient. ?Please refer to After Visit Summary for other counseling recommendations.  ? ?Return in about 1 week (around 03/07/2022) for OB VISIT, MD or APP. ? ?Future Appointments  ?Date Time Provider [place]?03/03/2022 10:15 AM StTruett MainlandDO [facility] None  ?03/10/2022 10:35 AM StNehemiah SettleaTanna SavoyDO [facility] None  ? ? ?PaRadene GunningMD ?

## 2022-03-01 LAB — CBC
Hematocrit: 30.4 % — ABNORMAL LOW (ref 34.0–46.6)
Hemoglobin: 9.9 g/dL — ABNORMAL LOW (ref 11.1–15.9)
MCH: 26.7 pg (ref 26.6–33.0)
MCHC: 32.6 g/dL (ref 31.5–35.7)
MCV: 82 fL (ref 79–97)
Platelets: 455 10*3/uL — ABNORMAL HIGH (ref 150–450)
RBC: 3.71 x10E6/uL — ABNORMAL LOW (ref 3.77–5.28)
RDW: 13.9 % (ref 11.7–15.4)
WBC: 17.8 10*3/uL — ABNORMAL HIGH (ref 3.4–10.8)

## 2022-03-01 LAB — COMPREHENSIVE METABOLIC PANEL
ALT: 7 IU/L (ref 0–32)
AST: 11 IU/L (ref 0–40)
Albumin/Globulin Ratio: 1.2 (ref 1.2–2.2)
Albumin: 3.3 g/dL — ABNORMAL LOW (ref 3.9–5.0)
Alkaline Phosphatase: 192 IU/L — ABNORMAL HIGH (ref 44–121)
BUN/Creatinine Ratio: 8 — ABNORMAL LOW (ref 9–23)
BUN: 4 mg/dL — ABNORMAL LOW (ref 6–20)
Bilirubin Total: 0.2 mg/dL (ref 0.0–1.2)
CO2: 18 mmol/L — ABNORMAL LOW (ref 20–29)
Calcium: 9.1 mg/dL (ref 8.7–10.2)
Chloride: 107 mmol/L — ABNORMAL HIGH (ref 96–106)
Creatinine, Ser: 0.51 mg/dL — ABNORMAL LOW (ref 0.57–1.00)
Globulin, Total: 2.7 g/dL (ref 1.5–4.5)
Glucose: 74 mg/dL (ref 70–99)
Potassium: 3.1 mmol/L — ABNORMAL LOW (ref 3.5–5.2)
Sodium: 139 mmol/L (ref 134–144)
Total Protein: 6 g/dL (ref 6.0–8.5)
eGFR: 133 mL/min/{1.73_m2} (ref >59)

## 2022-03-01 MED ORDER — FERROUS SULFATE 325 (65 FE) MG PO TABS: 325.0000 mg | ORAL_TABLET | ORAL | 3 refills | Status: DC

## 2022-03-01 NOTE — Addendum Note (Signed)
Addended by: Milas Hock A on: 03/01/2022 08:24 AM ? ? Modules accepted: Orders ? ?

## 2022-03-03 ENCOUNTER — Ambulatory Visit (INDEPENDENT_AMBULATORY_CARE_PROVIDER_SITE_OTHER): Payer: Medicaid Other | Admitting: Family Medicine

## 2022-03-03 ENCOUNTER — Encounter: Payer: Self-pay | Admitting: General Practice

## 2022-03-03 VITALS — BP 133/88 | HR 92

## 2022-03-03 DIAGNOSIS — Z2839 Other underimmunization status: Secondary | ICD-10-CM

## 2022-03-03 DIAGNOSIS — Z3A38 38 weeks gestation of pregnancy: Secondary | ICD-10-CM

## 2022-03-03 DIAGNOSIS — O09899 Supervision of other high risk pregnancies, unspecified trimester: Secondary | ICD-10-CM

## 2022-03-03 DIAGNOSIS — Z3480 Encounter for supervision of other normal pregnancy, unspecified trimester: Secondary | ICD-10-CM

## 2022-03-03 NOTE — Progress Notes (Signed)
Patient states she lost her mucouse plug two days ago and having a lot of discharge. Patient also complaining that baby has had hiccups for awhile this morning. Kathrene Alu RN  ?

## 2022-03-03 NOTE — Progress Notes (Signed)
? ?  PRENATAL VISIT NOTE ? ?Subjective:  ?Shannon Ruiz is a 26 y.o. G3P1011 at [redacted]w[redacted]d being seen today for ongoing prenatal care.  She is currently monitored for the following issues for this low-risk pregnancy and has Cigarette smoker; Anxiety disorder affecting pregnancy, antepartum; Rubella non-immune status, antepartum; Supervision of other normal pregnancy, antepartum; Obesity in pregnancy, antepartum; and History of IUGR (intrauterine growth retardation) and stillbirth, currently pregnant, second trimester on their problem list. ? ?Patient reports occasional contractions.  Contractions: Irregular. Vag. Bleeding: None.  Movement: Present. Denies leaking of fluid.  ? ?The following portions of the patient's history were reviewed and updated as appropriate: allergies, current medications, past family history, past medical history, past social history, past surgical history and problem list.  ? ?Objective:  ? ?Vitals:  ? 03/03/22 1033  ?BP: 133/88  ?Pulse: 92  ? ? ?Fetal Status: Fetal Heart Rate (bpm): 130 Fundal Height: 38 cm Movement: Present  Presentation: Vertex ? ?General:  Alert, oriented and cooperative. Patient is in no acute distress.  ?Skin: Skin is warm and dry. No rash noted.   ?Cardiovascular: Normal heart rate noted  ?Respiratory: Normal respiratory effort, no problems with respiration noted  ?Abdomen: Soft, gravid, appropriate for gestational age.  Pain/Pressure: Present     ?Pelvic: Cervical exam performed in the presence of a chaperone Dilation: 3 Effacement (%): 70 Station: -2  ?Extremities: Normal range of motion.  Edema: None  ?Mental Status: Normal mood and affect. Normal behavior. Normal judgment and thought content.  ? ?Assessment and Plan:  ?Pregnancy: G3P1011 at [redacted]w[redacted]d ?1. Supervision of other normal pregnancy, antepartum ?FHT and FH normal ?Elective induction around 40 weeks ? ?2. Rubella non-immune status, antepartum ? ?3. [redacted] weeks gestation of pregnancy ? ? ?Term labor symptoms and general  obstetric precautions including but not limited to vaginal bleeding, contractions, leaking of fluid and fetal movement were reviewed in detail with the patient. ?Please refer to After Visit Summary for other counseling recommendations.  ? ?No follow-ups on file. ? ?Future Appointments  ?Date Time Provider Department Center  ?03/10/2022 10:35 AM Adrian Blackwater Rhona Raider, DO [facility] None  ? ? ?Levie Heritage, DO ? ?

## 2022-03-08 ENCOUNTER — Telehealth (HOSPITAL_COMMUNITY): Payer: Self-pay | Admitting: *Deleted

## 2022-03-08 ENCOUNTER — Encounter (HOSPITAL_COMMUNITY): Payer: Self-pay | Admitting: *Deleted

## 2022-03-08 NOTE — Telephone Encounter (Signed)
Preadmission screen  

## 2022-03-09 ENCOUNTER — Other Ambulatory Visit: Payer: Self-pay | Admitting: Advanced Practice Midwife

## 2022-03-10 ENCOUNTER — Ambulatory Visit (INDEPENDENT_AMBULATORY_CARE_PROVIDER_SITE_OTHER): Payer: Medicaid Other | Admitting: Family Medicine

## 2022-03-10 ENCOUNTER — Other Ambulatory Visit: Payer: Self-pay

## 2022-03-10 VITALS — BP 134/86 | HR 91 | Wt 227.0 lb

## 2022-03-10 NOTE — Progress Notes (Signed)
? ?  PRENATAL VISIT NOTE ? ?Subjective:  ?Shannon Ruiz is a 26 y.o. G3P1011 at 28w5dbeing seen today for ongoing prenatal care.  She is currently monitored for the following issues for this high-risk pregnancy and has Cigarette smoker; Anxiety disorder affecting pregnancy, antepartum; Rubella non-immune status, antepartum; Supervision of other normal pregnancy, antepartum; Obesity in pregnancy, antepartum; and History of IUGR (intrauterine growth retardation) and stillbirth, currently pregnant, second trimester on their problem list. ? ?Patient reports no complaints.  Contractions: Irregular. Vag. Bleeding: None.  Movement: Present. Denies leaking of fluid.  ? ?The following portions of the patient's history were reviewed and updated as appropriate: allergies, current medications, past family history, past medical history, past social history, past surgical history and problem list.  ? ?Objective:  ? ?Vitals:  ? 03/10/22 1044  ?BP: 134/86  ?Pulse: 91  ?Weight: 227 lb (103 kg)  ? ? ?Fetal Status: Fetal Heart Rate (bpm): 138 Fundal Height: 39 cm Movement: Present  Presentation: Vertex ? ?General:  Alert, oriented and cooperative. Patient is in no acute distress.  ?Skin: Skin is warm and dry. No rash noted.   ?Cardiovascular: Normal heart rate noted  ?Respiratory: Normal respiratory effort, no problems with respiration noted  ?Abdomen: Soft, gravid, appropriate for gestational age.  Pain/Pressure: Present     ?Pelvic: Cervical exam performed in the presence of a chaperone Dilation: 3.5 Effacement (%): 70 Station: -2  ?Extremities: Normal range of motion.  Edema: None  ?Mental Status: Normal mood and affect. Normal behavior. Normal judgment and thought content.  ? ?Assessment and Plan:  ?Pregnancy: G3P1011 at 368w5d1. [redacted] weeks gestation of pregnancy ? ?2. Supervision of other normal pregnancy, antepartum ?FHT and FH normal ? ?3. History of IUGR (intrauterine growth retardation) and stillbirth, currently pregnant, second  trimester ?Normal growth ? ?4. Rubella non-immune status, antepartum ?MMR post delivery ? ?5. Anxiety disorder affecting pregnancy, antepartum ?\ ? ?Term labor symptoms and general obstetric precautions including but not limited to vaginal bleeding, contractions, leaking of fluid and fetal movement were reviewed in detail with the patient. ?Please refer to After Visit Summary for other counseling recommendations.  ? ?No follow-ups on file. ? ?Future Appointments  ?Date Time Provider [place]?03/14/2022  7:15 AM [facility] SCHED ROOM [facility] None  ? ? ?JaTruett MainlandDO ?

## 2022-03-14 ENCOUNTER — Inpatient Hospital Stay (HOSPITAL_COMMUNITY): Payer: Medicaid Other | Admitting: Anesthesiology

## 2022-03-14 ENCOUNTER — Encounter (HOSPITAL_COMMUNITY): Payer: Self-pay | Admitting: Family Medicine

## 2022-03-14 ENCOUNTER — Encounter (HOSPITAL_COMMUNITY): Payer: Self-pay | Admitting: Anesthesiology

## 2022-03-14 ENCOUNTER — Other Ambulatory Visit: Payer: Self-pay

## 2022-03-14 ENCOUNTER — Inpatient Hospital Stay (HOSPITAL_COMMUNITY): Payer: Medicaid Other

## 2022-03-14 ENCOUNTER — Inpatient Hospital Stay (HOSPITAL_COMMUNITY)
Admission: AD | Admit: 2022-03-14 | Discharge: 2022-03-16 | DRG: 807 | Disposition: A | Discharge status: Home / Self Care | Payer: Medicaid Other | Attending: Obstetrics & Gynecology | Admitting: Obstetrics & Gynecology

## 2022-03-14 DIAGNOSIS — Z3A40 40 weeks gestation of pregnancy: Secondary | ICD-10-CM | POA: Diagnosis not present

## 2022-03-14 DIAGNOSIS — O134 Gestational [pregnancy-induced] hypertension without significant proteinuria, complicating childbirth: Secondary | ICD-10-CM | POA: Diagnosis present

## 2022-03-14 DIAGNOSIS — O26893 Other specified pregnancy related conditions, third trimester: Secondary | ICD-10-CM | POA: Diagnosis present

## 2022-03-14 DIAGNOSIS — Z2839 Other underimmunization status: Secondary | ICD-10-CM

## 2022-03-14 DIAGNOSIS — E876 Hypokalemia: Secondary | ICD-10-CM | POA: Diagnosis present

## 2022-03-14 DIAGNOSIS — F419 Anxiety disorder, unspecified: Secondary | ICD-10-CM | POA: Diagnosis present

## 2022-03-14 DIAGNOSIS — O139 Gestational [pregnancy-induced] hypertension without significant proteinuria, unspecified trimester: Secondary | ICD-10-CM | POA: Diagnosis not present

## 2022-03-14 DIAGNOSIS — O480 Post-term pregnancy: Secondary | ICD-10-CM | POA: Diagnosis present

## 2022-03-14 DIAGNOSIS — O99284 Endocrine, nutritional and metabolic diseases complicating childbirth: Secondary | ICD-10-CM | POA: Diagnosis present

## 2022-03-14 DIAGNOSIS — Z3480 Encounter for supervision of other normal pregnancy, unspecified trimester: Secondary | ICD-10-CM

## 2022-03-14 DIAGNOSIS — O99214 Obesity complicating childbirth: Secondary | ICD-10-CM | POA: Diagnosis present

## 2022-03-14 DIAGNOSIS — Z23 Encounter for immunization: Secondary | ICD-10-CM | POA: Diagnosis not present

## 2022-03-14 DIAGNOSIS — O99210 Obesity complicating pregnancy, unspecified trimester: Secondary | ICD-10-CM | POA: Diagnosis present

## 2022-03-14 DIAGNOSIS — O09292 Supervision of pregnancy with other poor reproductive or obstetric history, second trimester: Secondary | ICD-10-CM

## 2022-03-14 DIAGNOSIS — O09899 Supervision of other high risk pregnancies, unspecified trimester: Secondary | ICD-10-CM

## 2022-03-14 DIAGNOSIS — Z8759 Personal history of other complications of pregnancy, childbirth and the puerperium: Secondary | ICD-10-CM | POA: Diagnosis not present

## 2022-03-14 DIAGNOSIS — Z3490 Encounter for supervision of normal pregnancy, unspecified, unspecified trimester: Secondary | ICD-10-CM | POA: Diagnosis present

## 2022-03-14 LAB — CBC
HCT: 29.9 % — ABNORMAL LOW (ref 36.0–46.0)
HCT: 30.4 % — ABNORMAL LOW (ref 36.0–46.0)
Hemoglobin: 9.5 g/dL — ABNORMAL LOW (ref 12.0–15.0)
Hemoglobin: 9.8 g/dL — ABNORMAL LOW (ref 12.0–15.0)
MCH: 26.2 pg (ref 26.0–34.0)
MCH: 26.3 pg (ref 26.0–34.0)
MCHC: 31.8 g/dL (ref 30.0–36.0)
MCHC: 32.2 g/dL (ref 30.0–36.0)
MCV: 82.4 fL (ref 80.0–100.0)
MCV: 81.5 fL (ref 80.0–100.0)
Platelets: 434 10*3/uL — ABNORMAL HIGH (ref 150–400)
Platelets: 446 10*3/uL — ABNORMAL HIGH (ref 150–400)
RBC: 3.63 MIL/uL — ABNORMAL LOW (ref 3.87–5.11)
RBC: 3.73 MIL/uL — ABNORMAL LOW (ref 3.87–5.11)
RDW: 15 % (ref 11.5–15.5)
RDW: 15 % (ref 11.5–15.5)
WBC: 18.7 10*3/uL — ABNORMAL HIGH (ref 4.0–10.5)
WBC: 21.1 10*3/uL — ABNORMAL HIGH (ref 4.0–10.5)
nRBC: 0 % (ref 0.0–0.2)
nRBC: 0 % (ref 0.0–0.2)

## 2022-03-14 LAB — COMPREHENSIVE METABOLIC PANEL
ALT: 8 U/L (ref 0–44)
AST: 16 U/L (ref 15–41)
Albumin: 2.4 g/dL — ABNORMAL LOW (ref 3.5–5.0)
Alkaline Phosphatase: 169 U/L — ABNORMAL HIGH (ref 38–126)
Anion gap: 11 (ref 5–15)
BUN: <5 mg/dL — ABNORMAL LOW (ref 6–20)
CO2: 17 mmol/L — ABNORMAL LOW (ref 22–32)
Calcium: 8.5 mg/dL — ABNORMAL LOW (ref 8.9–10.3)
Chloride: 111 mmol/L (ref 98–111)
Creatinine, Ser: 0.57 mg/dL (ref 0.44–1.00)
GFR, Estimated: >60 mL/min (ref >60)
Glucose, Bld: 130 mg/dL — ABNORMAL HIGH (ref 70–99)
Potassium: 2.6 mmol/L — CL (ref 3.5–5.1)
Sodium: 139 mmol/L (ref 135–145)
Total Bilirubin: 0.3 mg/dL (ref 0.3–1.2)
Total Protein: 6.2 g/dL — ABNORMAL LOW (ref 6.5–8.1)

## 2022-03-14 LAB — PROTEIN / CREATININE RATIO, URINE
Creatinine, Urine: 17.61 mg/dL
Total Protein, Urine: <6 mg/dL

## 2022-03-14 LAB — TYPE AND SCREEN
ABO/RH(D): A POS
Antibody Screen: NEGATIVE

## 2022-03-14 MED ORDER — FENTANYL-BUPIVACAINE-NACL 0.5-0.125-0.9 MG/250ML-% EP SOLN
12.0000 mL/h | EPIDURAL | Status: DC | PRN
  Administered 2022-03-14: 12 mL/h via EPIDURAL
  Filled 2022-03-14: qty 250

## 2022-03-14 MED ORDER — LIDOCAINE HCL (PF) 1 % IJ SOLN
30.0000 mL | INTRAMUSCULAR | Status: DC | PRN
Start: 2022-03-14 — End: 2022-03-15

## 2022-03-14 MED ORDER — SOD CITRATE-CITRIC ACID 500-334 MG/5ML PO SOLN: 30.0000 mL | ORAL | Status: DC | PRN

## 2022-03-14 MED ORDER — LIDOCAINE HCL (PF) 1 % IJ SOLN
INTRAMUSCULAR | Status: DC | PRN
  Administered 2022-03-14: 8 mL via EPIDURAL

## 2022-03-14 MED ORDER — DIPHENHYDRAMINE HCL 50 MG/ML IJ SOLN: 12.5000 mg | INTRAMUSCULAR | Status: DC | PRN

## 2022-03-14 MED ORDER — OXYCODONE-ACETAMINOPHEN 5-325 MG PO TABS: 2.0000 | ORAL_TABLET | ORAL | Status: DC | PRN

## 2022-03-14 MED ORDER — OXYTOCIN-SODIUM CHLORIDE 30-0.9 UT/500ML-% IV SOLN
2.5000 [IU]/h | INTRAVENOUS | Status: DC
  Administered 2022-03-15: 2.5 [IU]/h via INTRAVENOUS

## 2022-03-14 MED ORDER — EPHEDRINE 5 MG/ML INJ: 10.0000 mg | INTRAVENOUS | Status: DC | PRN

## 2022-03-14 MED ORDER — OXYTOCIN-SODIUM CHLORIDE 30-0.9 UT/500ML-% IV SOLN
1.0000 m[IU]/min | INTRAVENOUS | Status: DC
  Administered 2022-03-14: 2 m[IU]/min via INTRAVENOUS
  Filled 2022-03-14: qty 500

## 2022-03-14 MED ORDER — LACTATED RINGERS IV SOLN: INTRAVENOUS | Status: DC

## 2022-03-14 MED ORDER — FENTANYL-BUPIVACAINE-NACL 0.5-0.125-0.9 MG/250ML-% EP SOLN: 12.0000 mL/h | EPIDURAL | Status: DC | PRN

## 2022-03-14 MED ORDER — POTASSIUM CHLORIDE 20 MEQ PO PACK
40.0000 meq | PACK | Freq: Once | ORAL | Status: AC
  Administered 2022-03-14: 40 meq via ORAL
  Filled 2022-03-14: qty 2

## 2022-03-14 MED ORDER — TERBUTALINE SULFATE 1 MG/ML IJ SOLN
0.2500 mg | Freq: Once | INTRAMUSCULAR | Status: DC | PRN
Start: 2022-03-14 — End: 2022-03-15

## 2022-03-14 MED ORDER — ACETAMINOPHEN 325 MG PO TABS: 650.0000 mg | ORAL_TABLET | ORAL | Status: DC | PRN

## 2022-03-14 MED ORDER — OXYTOCIN BOLUS FROM INFUSION
333.0000 mL | Freq: Once | INTRAVENOUS | Status: AC
  Administered 2022-03-14: 333 mL via INTRAVENOUS

## 2022-03-14 MED ORDER — OXYCODONE-ACETAMINOPHEN 5-325 MG PO TABS: 1.0000 | ORAL_TABLET | ORAL | Status: DC | PRN

## 2022-03-14 MED ORDER — ONDANSETRON HCL 4 MG/2ML IJ SOLN
4.0000 mg | Freq: Four times a day (QID) | INTRAMUSCULAR | Status: DC | PRN
Start: 2022-03-14 — End: 2022-03-15

## 2022-03-14 MED ORDER — LACTATED RINGERS IV SOLN: 500.0000 mL | INTRAVENOUS | Status: DC | PRN

## 2022-03-14 MED ORDER — LACTATED RINGERS IV SOLN
500.0000 mL | Freq: Once | INTRAVENOUS | Status: AC
  Administered 2022-03-14: 500 mL via INTRAVENOUS

## 2022-03-14 MED ORDER — PHENYLEPHRINE 40 MCG/ML (10ML) SYRINGE FOR IV PUSH (FOR BLOOD PRESSURE SUPPORT): 80.0000 ug | PREFILLED_SYRINGE | INTRAVENOUS | Status: DC | PRN

## 2022-03-14 MED ORDER — POTASSIUM CHLORIDE 10 MEQ/100ML IV SOLN
10.0000 meq | INTRAVENOUS | Status: AC
  Administered 2022-03-14 (×4): 10 meq via INTRAVENOUS
  Filled 2022-03-14 (×4): qty 100

## 2022-03-14 NOTE — Progress Notes (Signed)
Patient Vitals for the past 4 hrs: ? BP Pulse Resp  ?03/14/22 1828 -- -- 18  ?03/14/22 1805 (!) 135/96 95 18  ?03/14/22 1736 136/87 100 18  ?03/14/22 1653 (!) 136/95 95 18  ?03/14/22 1603 (!) 147/93 97 --  ?03/14/22 1530 134/85 93 18  ?03/14/22 1447 (!) 143/93 97 --  ? ?Ctx getting stronger.  FHR Cat 1.  Cx 4/70/-2. Pitocin at 14 mu/min., AROM w/clear fluid.  ?

## 2022-03-14 NOTE — Anesthesia Procedure Notes (Addendum)
Epidural ?Patient location during procedure: OB ?Start time: 03/14/2022 7:02 PM ?End time: 03/14/2022 7:10 PM ? ?Staffing ?Anesthesiologist: Janeece Riggers, MD ? ?Preanesthetic Checklist ?Completed: patient identified, IV checked, site marked, risks and benefits discussed, surgical consent, monitors and equipment checked, pre-op evaluation and timeout performed ? ?Epidural ?Patient position: sitting ?Prep: DuraPrep and site prepped and draped ?Patient monitoring: continuous pulse ox and blood pressure ?Approach: midline ?Location: L3-L4 ?Injection technique: LOR air ? ?Needle:  ?Needle type: Tuohy  ?Needle gauge: 17 G ?Needle length: 9 cm and 9 ?Needle insertion depth: 6 cm ?Catheter type: closed end flexible ?Catheter size: 19 Gauge ?Catheter at skin depth: 11 cm ?Test dose: negative ? ?Assessment ?Events: blood not aspirated, injection not painful, no injection resistance, no paresthesia and negative IV test ? ? ? ?

## 2022-03-14 NOTE — Progress Notes (Deleted)
OBSTETRIC ADMISSION HISTORY AND PHYSICAL ? ?Shannon Ruiz is a 26 y.o. female G3P1011 with IUP at 11w2dby LMP presenting for elective IOL. She reports +FMs, No LOF, no VB, no blurry vision, headaches or peripheral edema, and RUQ pain.  She plans on breast feeding. She requests outpatient IUD for birth control. ?[facility]  ? ?Dating: By LMP --->  Estimated Date of Delivery: 03/12/22 ? ?Sono:   ? ?'@[redacted]w[redacted]d' , CWD, normal anatomy, frank breach presentation,  1547 g, 93% EFW ? ? ?Prenatal History/Complications:  ?--Hx of IUGR and stillbirth (growth scans consistent with dates this pregnancy) ?--Rubella NI ?--Anxiety disorder ?--New gHTN: elevated BP in clinic at 37 weeks (146/90)>elevated pressures here 149-152/80s ? ? ?Past Medical History: ?Past Medical History:  ?Diagnosis Date  ? Anemia   ? Anxiety   ? Depression   ? ? ?Past Surgical History: ?Past Surgical History:  ?Procedure Laterality Date  ? NO PAST SURGERIES    ? ? ?Obstetrical History: ?OB History   ? ? Gravida  ?3  ? Para  ?1  ? Term  ?1  ? Preterm  ?   ? AB  ?1  ? Living  ?1  ?  ? ? SAB  ?1  ? IAB  ?   ? Ectopic  ?   ? Multiple  ?0  ? Live Births  ?1  ?   ?  ?  ? ? ?Social History ?Social History  ? ?Socioeconomic History  ? Marital status: Single  ?  Spouse name: Not on file  ? Number of children: Not on file  ? Years of education: Not on file  ? Highest education level: Not on file  ?Occupational History  ? Not on file  ?Tobacco Use  ? Smoking status: Never  ? Smokeless tobacco: Never  ?Vaping Use  ? Vaping Use: Never used  ?Substance and Sexual Activity  ? Alcohol use: Never  ? Drug use: Never  ? Sexual activity: Not Currently  ?  Birth control/protection: None  ?Other Topics Concern  ? Not on file  ?Social History Narrative  ? Not on file  ? ?Social Determinants of Health  ? ?Financial Resource Strain: Not on file  ?Food Insecurity: Not on file  ?Transportation Needs: Not on file  ?Physical Activity: Not on file  ?Stress: Not on  file  ?Social Connections: Not on file  ? ? ?Family History: ?Family History  ?Problem Relation Age of Onset  ? Anxiety disorder Mother   ? Hypertension Mother   ? Heart Problems Mother   ? Diabetes Father   ? ? ?Allergies: ?No Known Allergies ? ?Pt denies allergies to latex, iodine, or shellfish. ? ?Medications Prior to Admission  ?Medication Sig Dispense Refill Last Dose  ? aspirin EC 81 MG tablet Take 1 tablet (81 mg total) by mouth daily. Take after 12 weeks for prevention of preeclampsia later in pregnancy (Patient not taking: Reported on 02/24/2022) 300 tablet 2   ? famotidine (PEPCID) 20 MG tablet Take 1 tablet (20 mg total) by mouth 2 (two) times daily. 60 tablet 3   ? ferrous sulfate (FERROUSUL) 325 (65 FE) MG tablet Take 1 tablet (325 mg total) by mouth every other day. (Patient not taking: Reported on 03/10/2022) 30 tablet 3   ? Prenatal Vit-Fe Fumarate-FA (PREPLUS) 27-1 MG TABS Take 1 tablet by mouth daily. 30 tablet 13   ? ? ? ?Review of Systems  ? ?All systems reviewed and  negative except as stated in HPI ? ?Blood pressure (!) 152/85, pulse 97, temperature 98.1 ?F (36.7 ?C), temperature source Oral, resp. rate 18, height '5\' 3"'  (1.6 m), weight 104.6 kg, last menstrual period 06/05/2021, not currently breastfeeding. ?General appearance: alert, cooperative, and no distress ?Lungs: no iWOB ?Heart: regular rate and rhythm ?Abdomen: soft, non-tender ?Extremities: no sign of DVT ?Presentation: cephalic ?Fetal monitoringBaseline: 130 bpm, Variability: Good {> 6 bpm), Accelerations: Reactive, and Decelerations: Absent ?Uterine activityFrequency: Occasional ?Dilation: 3.5 ?Effacement (%): 70 ?Station: -1 ?Exam by:: Annitta Jersey, rn ? ? ?Prenatal labs: ?ABO, Rh: --/--/PENDING (03/20 1140) ?Antibody: PENDING (03/20 1140) ?Rubella: <0.90 (09/09 1009) ?RPR: Non Reactive (01/04 0835)  ?HBsAg: Negative (09/09 1009)  ?HIV: Non Reactive (01/04 0835)  ?GBS: Negative/-- (02/23 1134)  ?2 hr Glucola: 104 normal ?Genetic screening:   NIPS LR, Horizon negative ?Anatomy US: Nml female ? ?Prenatal Transfer Tool  ?Maternal Diabetes: No ?Genetic Screening: Normal ?Maternal Ultrasounds/Referrals: Normal ?Fetal Ultrasounds or other Referrals:  Growth scan 28.4 normal (Hx of IUGR) ?Maternal Substance Abuse:  No ?Significant Maternal Medications:  None ?Significant Maternal Lab Results: Group B Strep negative ? ?Results for orders placed or performed during the hospital encounter of 03/14/22 (from the past 24 hour(s))  ?CBC  ? Collection Time: 03/14/22 11:40 AM  ?Result Value Ref Range  ? WBC 18.7 (H) 4.0 - 10.5 K/uL  ? RBC 3.63 (L) 3.87 - 5.11 MIL/uL  ? Hemoglobin 9.5 (L) 12.0 - 15.0 g/dL  ? HCT 29.9 (L) 36.0 - 46.0 %  ? MCV 82.4 80.0 - 100.0 fL  ? MCH 26.2 26.0 - 34.0 pg  ? MCHC 31.8 30.0 - 36.0 g/dL  ? RDW 15.0 11.5 - 15.5 %  ? Platelets 434 (H) 150 - 400 K/uL  ? nRBC 0.0 0.0 - 0.2 %  ?Type and screen  ? Collection Time: 03/14/22 11:40 AM  ?Result Value Ref Range  ? ABO/RH(D) PENDING   ? Antibody Screen PENDING   ? Sample Expiration    ?  03/17/2022,2359 ?Performed at [facility], [place] [address] [place], [place] [address] ?  ? ? ?Patient Active Problem List  ? Diagnosis Date Noted  ? Encounter for induction of labor 03/14/2022  ? History of IUGR (intrauterine growth retardation) and stillbirth, currently pregnant, second trimester 10/27/2021  ? Supervision of other normal pregnancy, antepartum 09/03/2021  ? Obesity in pregnancy, antepartum 09/03/2021  ? Rubella non-immune status, antepartum 01/09/2021  ? Cigarette smoker 06/30/2020  ? Anxiety disorder affecting pregnancy, antepartum 06/30/2020  ? ? ?Assessment/Plan:  ?Shannon Ruiz is a 26 y.o. G3P1011 at 89w2dhere for elective IOL ? ?#Labor: IOL starting with pitocin ?#Pain: Plan for epidural ?[name]: Cat I ?#ID:  GBS negative ?#MOF: Breast ?#MOC: IUD outpatient ?#Circ:  Desires inpatient ? ?[name]  ?Elevated BP in clinic at 37 weeks (146/90)>elevated pressures here 149-152/80s.  preE labs ordered.  Currently asymptomatic. ?-serial BP ? ?#Rubella NI ?-MMR PP ? ?MGerrit Heck MFerry Passfor WDean Foods Company CCedar?03/14/2022, 12:36 PM ? ?  ?

## 2022-03-14 NOTE — Discharge Summary (Signed)
? ?  Postpartum Discharge Summary ? ? ?Patient Name: Shannon Ruiz ?DOB: December 23, 1996 ?MRN: [id number] ? ?Date of admission: 03/14/2022 ?Delivery date:03/14/2022  ?Delivering provider: Christin Fudge  ?Date of discharge: 03/16/2022 ? ?Admitting diagnosis: Encounter for induction of labor [Z34.90] ?Intrauterine pregnancy: [redacted]w[redacted]d    ?Secondary diagnosis:  Principal Problem: ?  Vaginal delivery ?Active Problems: ?  Anxiety disorder affecting pregnancy, antepartum ?  Rubella non-immune status, antepartum ?  Supervision of other normal pregnancy, antepartum ?  Obesity in pregnancy, antepartum ?  History of IUGR (intrauterine growth retardation) and stillbirth, currently pregnant, second trimester ?  Encounter for induction of labor ?  Gestational hypertension ?  Hypokalemia ? ?Additional problems: None     ?Discharge diagnosis: Term Pregnancy Delivered                                              ?Post partum procedures: None ?Augmentation: AROM and Pitocin ?Complications: None ? ?Hospital course: Induction of Labor With Vaginal Delivery   ?26y.o. yo G3P1011 at 428w3das admitted to the hospital 03/14/2022 for induction of labor.  Indication for induction: Gestational hypertension.  Patient had an uncomplicated labor course as follows: ?Membrane Rupture Time/Date: 6:28 PM ,03/14/2022   ?Delivery Method:Vaginal, Spontaneous  ?Episiotomy: None  ?Lacerations:  None  ?Details of delivery can be found in separate delivery note.  Patient had a routine postpartum course.  She was started on Procardia 30 mg for BP control and was titrated to 60 mg with adequate control of her pressures. She was also started on Lasix 20 mg, which she will complete for a 5 day course. Patient was found to be hypokalemic while admitted, and she was given both IV and oral potassium supplementation with good result. She will continue this while on Lasix upon discharge with plan to repeat levels in 1 week at her BP check appointment. She is eating,  drinking, voiding, and ambulating without issue. She is breastfeeding which is going well. Patient is discharged home 03/16/22. ? ?Newborn Data: ?Birth date:03/14/2022  ?Birth time:11:47 PM  ?Gender:Female  ?Living status:Living  ?Apgars:9 ,9  ?Weight:3510 g  ? ?Magnesium Sulfate received: No ?BMZ received: No ?Rhophylac: N/A ?MMR: Offered postpartum  ?T-DaP: Offered postpartum  ?Flu: No ?Transfusion: No ? ?Physical exam  ?Vitals:  ? 03/15/22 2030 03/15/22 2045 03/16/22 0000 03/16/22 0535  ?BP: (!) 143/85 (!) 135/95 118/64 124/82  ?Pulse: 80 82 98 88  ?Resp: '18  18 18  ' ?Temp: 98.2 ?F (36.8 ?C)  97.8 ?F (36.6 ?C) 98.2 ?F (36.8 ?C)  ?TempSrc: Oral  Oral Oral  ?SpO2: 98% 98% 100% 100%  ?Weight:      ?Height:      ? ?General: alert, cooperative, and no distress ?Lochia: appropriate ?Uterine Fundus: firm and below umbilicus  ?DVT Evaluation: trace LE edema, no calf tenderness to palpation  ? ?Labs: ?Lab Results  ?Component Value Date  ? WBC 22.3 (H) 03/15/2022  ? HGB 9.6 (L) 03/15/2022  ? HCT 29.1 (L) 03/15/2022  ? MCV 81.3 03/15/2022  ? PLT 391 03/15/2022  ? ? ?  Latest Ref Rng & Units 03/16/2022  ?  5:10 AM  ?CMP  ?Glucose 70 - 99 mg/dL 84    ?BUN 6 - 20 mg/dL 6    ?Creatinine 0.44 - 1.00 mg/dL 0.45    ?Sodium 135 - 145 mmol/L 138    ?  Potassium 3.5 - 5.1 mmol/L 3.1    ?Chloride 98 - 111 mmol/L 109    ?CO2 22 - 32 mmol/L 19    ?Calcium 8.9 - 10.3 mg/dL 8.5    ? ?Edinburgh Score: ? ?  02/24/2021  ?  3:45 PM  ?Edinburgh Postnatal Depression Scale Screening Tool  ?I have been able to laugh and see the funny side of things. 0  ?I have looked forward with enjoyment to things. 0  ?I have blamed myself unnecessarily when things went wrong. 1  ?I have been anxious or worried for no good reason. 0  ?I have felt scared or panicky for no good reason. 0  ?Things have been getting on top of me. 1  ?I have been so unhappy that I have had difficulty sleeping. 0  ?I have felt sad or miserable. 0  ?I have been so unhappy that I have been  crying. 0  ?The thought of harming myself has occurred to me. 0  ?Edinburgh Postnatal Depression Scale Total 2  ? ? ? ?After visit meds:  ?Allergies as of 03/16/2022   ?No Known Allergies ?  ? ?  ?Medication List  ?  ? ?TAKE these medications   ? ?acetaminophen 500 MG tablet ?Commonly known as: TYLENOL ?Take 2 tablets (1,000 mg total) by mouth every 8 (eight) hours as needed (pain). ?  ?furosemide 20 MG tablet ?Commonly known as: LASIX ?Take 1 tablet (20 mg total) by mouth daily for 3 days. ?  ?ibuprofen 600 MG tablet ?Commonly known as: ADVIL ?Take 1 tablet (600 mg total) by mouth every 6 (six) hours as needed (pain). ?  ?NIFEdipine 60 MG 24 hr tablet ?Commonly known as: ADALAT CC ?Take 1 tablet (60 mg total) by mouth daily. ?  ?Potassium Chloride ER 20 MEQ Tbcr ?Take 40 mEq by mouth 2 (two) times daily for 3 days. ?  ? ?  ? ? ? ?Discharge home in stable condition ?Infant Feeding: Breast ?Infant Disposition: home with mother ?Discharge instruction: per After Visit Summary and Postpartum booklet. ?Activity: Advance as tolerated. Pelvic rest for 6 weeks.  ?Diet: routine diet ?Future Appointments: ?Future Appointments  ?Date Time Provider [place]  ?03/24/2022 10:00 AM [facility] NURSE [facility] None  ?04/20/2022 11:15 AM Truett Mainland, DO [facility] None  ? ?Follow up Visit: ?Please schedule this patient for a In person postpartum visit in 4 weeks with the following provider: Any provider. ?Additional Postpartum F/U: BP check 1 week  ?Low risk pregnancy complicated by: HTN ?Delivery mode:  Vaginal, Spontaneous  ?Anticipated Birth Control:  IUD outpatient  ? ?Message sent to office to also check BMP at visit for BP check to ensure stable electrolytes.  ? ?03/16/2022 ?Genia Del, MD ? ? ? ?

## 2022-03-14 NOTE — Progress Notes (Signed)
Labor Progress Note ?Shannon Ruiz is a 26 y.o. G3P1011 at [redacted]w[redacted]d presented for elective IOL and found to have gestational HTN. ?S: Patient is resting comfortably. Open to AROM. ? ?O:  ?BP (!) 135/96   Pulse 95   Temp 98.1 ?F (36.7 ?C) (Oral)   Resp 18   Ht 5\' 3"  (1.6 m)   Wt 104.6 kg   LMP 06/05/2021   BMI 40.85 kg/m?  ?EFM: baseline 125s/moderate variability/+ accels, no decels ?Toco: q2-3 minutes ? ?CVE: Dilation: 3 ?Effacement (%): 70 ?Cervical Position: Middle ?Station: Ballotable ?Presentation: Vertex ?Exam by:: Dr [email or web address] ? ? ?A&P: 26 y.o. 30 [redacted]w[redacted]d presenting for IOL. ? ?#Labor: Progressing well. Continue pitocin, head ballotable thus AROM not attempted at this check. Consider at next check. ?#Pain: Epidural when desires ?[name]: cat I, reassuring ?[name] negative ? ?#Hypokalemia- K 2.6 on admission, given 40 mEq IV and 40 mEq PO. Recheck K in AM. ? ?#gestational HTN- asymptomatic, pre-E labs negative, continue to monitor BP. No severe range pressures. ? ?[redacted]w[redacted]d, MD ?[facility] for [facility] ?6:27 PM ? ?

## 2022-03-14 NOTE — Anesthesia Preprocedure Evaluation (Signed)
Anesthesia Evaluation  ?Patient identified by MRN, date of birth, ID band ?Patient awake ? ? ? ?Reviewed: ?Allergy & Precautions, H&P , NPO status , Patient's Chart, lab work & pertinent test results, reviewed documented beta blocker date and time  ? ?Airway ?Mallampati: II ? ?TM Distance: >3 FB ?Neck ROM: full ? ? ? Dental ?no notable dental hx. ?(+) Teeth Intact, Dental Advisory Given, Poor Dentition ?  ?Pulmonary ?neg pulmonary ROS,  ?  ?Pulmonary exam normal ?breath sounds clear to auscultation ? ? ? ? ? ? Cardiovascular ?negative cardio ROS ?Normal cardiovascular exam ?Rhythm:regular Rate:Normal ? ? ?  ?Neuro/Psych ?PSYCHIATRIC DISORDERS Anxiety Depression negative neurological ROS ?   ? GI/Hepatic ?negative GI ROS, Neg liver ROS,   ?Endo/Other  ?Morbid obesity ? Renal/GU ?negative Renal ROS  ?negative genitourinary ?  ?Musculoskeletal ? ? Abdominal ?  ?Peds ? Hematology ? ?(+) Blood dyscrasia, anemia ,   ?Anesthesia Other Findings ? ? Reproductive/Obstetrics ?(+) Pregnancy ? ?  ? ? ? ? ? ? ? ? ? ? ? ? ? ?  ?  ? ? ? ? ? ? ? ? ?Anesthesia Physical ?Anesthesia Plan ? ?ASA: 3 ? ?Anesthesia Plan: Epidural  ? ?Post-op Pain Management:   ? ?Induction:  ? ?PONV Risk Score and Plan: 2 ? ?Airway Management Planned: Natural Airway ? ?Additional Equipment: None ? ?Intra-op Plan:  ? ?Post-operative Plan:  ? ?Informed Consent: I have reviewed the patients History and Physical, chart, labs and discussed the procedure including the risks, benefits and alternatives for the proposed anesthesia with the patient or authorized representative who has indicated his/her understanding and acceptance.  ? ? ? ? ? ?Plan Discussed with: Anesthesiologist and CRNA ? ?Anesthesia Plan Comments:   ? ? ? ? ? ? ?Anesthesia Quick Evaluation ? ?

## 2022-03-14 NOTE — H&P (Addendum)
OBSTETRIC ADMISSION HISTORY AND PHYSICAL ?  ?Shannon Ruiz is a 26 y.o. female G3P1011 with IUP at 51w2dby LMP presenting for elective IOL. She reports +FMs, No LOF, no VB, no blurry vision, headaches or peripheral edema, and RUQ pain.  She plans on breast feeding. She requests outpatient IUD for birth control. ?[facility]  ?  ?Dating: By LMP --->  Estimated Date of Delivery: 03/12/22 ?  ?Sono:   ?  ?_0 , CWD, normal anatomy, frank breech presentation,  1547 g, 93% EFW ?  ?  ?Prenatal History/Complications:  ?--Hx of IUGR and stillbirth (growth scans consistent with dates this pregnancy) ?--Rubella NI ?--Anxiety disorder ?--New gHTN: elevated BP in clinic at 37 weeks (146/90)>elevated pressures here 149-152/80s ?  ?  ?Past Medical History: ?    ?Past Medical History:  ?Diagnosis Date  ? Anemia    ? Anxiety    ? Depression    ?  ?  ?Past Surgical History: ?     ?Past Surgical History:  ?Procedure Laterality Date  ? NO PAST SURGERIES      ?  ?  ?Obstetrical History: ?OB History   ?  ?  Gravida  ?3  ? Para  ?1  ? Term  ?1  ? Preterm  ?   ? AB  ?1  ? Living  ?1  ?  ?  ?  SAB  ?1  ? IAB  ?   ? Ectopic  ?   ? Multiple  ?0  ? Live Births  ?1  ?    ?  ?   ?  ?  ?Social History ?Social History  ?  ?     ?Socioeconomic History  ? Marital status: Single  ?    Spouse name: Not on file  ? Number of children: Not on file  ? Years of education: Not on file  ? Highest education level: Not on file  ?Occupational History  ? Not on file  ?Tobacco Use  ? Smoking status: Never  ? Smokeless tobacco: Never  ?Vaping Use  ? Vaping Use: Never used  ?Substance and Sexual Activity  ? Alcohol use: Never  ? Drug use: Never  ? Sexual activity: Not Currently  ?    Birth control/protection: None  ?Other Topics Concern  ? Not on file  ?Social History Narrative  ? Not on file  ?  ?Social Determinants of Health  ?  ?Financial Resource Strain: Not on file  ?Food Insecurity: Not on file  ?Transportation Needs: Not on file   ?Physical Activity: Not on file  ?Stress: Not on file  ?Social Connections: Not on file  ?  ?  ?Family History: ?     ?Family History  ?Problem Relation Age of Onset  ? Anxiety disorder Mother    ? Hypertension Mother    ? Heart Problems Mother    ? Diabetes Father    ?  ?  ?Allergies: ?No Known Allergies ?  ?Pt denies allergies to latex, iodine, or shellfish. ?  ?       ?Medications Prior to Admission  ?Medication Sig Dispense Refill Last Dose  ? aspirin EC 81 MG tablet Take 1 tablet (81 mg total) by mouth daily. Take after 12 weeks for prevention of preeclampsia later in pregnancy (Patient not taking: Reported on 02/24/2022) 300 tablet 2    ? famotidine (PEPCID) 20 MG tablet Take 1 tablet (20 mg total) by mouth 2 (two)  times daily. 60 tablet 3    ? ferrous sulfate (FERROUSUL) 325 (65 FE) MG tablet Take 1 tablet (325 mg total) by mouth every other day. (Patient not taking: Reported on 03/10/2022) 30 tablet 3    ? Prenatal Vit-Fe Fumarate-FA (PREPLUS) 27-1 MG TABS Take 1 tablet by mouth daily. 30 tablet 13    ?  ?  ?  ?Review of Systems  ?  ?All systems reviewed and negative except as stated in HPI ?  ?Blood pressure (!) 152/85, pulse 97, temperature 98.1 ?F (36.7 ?C), temperature source Oral, resp. rate 18, height _0  (1.6 m), weight 104.6 kg, last menstrual period 06/05/2021, not currently breastfeeding. ?General appearance: alert, cooperative, and no distress ?Lungs: no iWOB ?Heart: regular rate and rhythm ?Abdomen: soft, non-tender ?Extremities: no sign of DVT ?Presentation: cephalic ?Fetal monitoringBaseline: 130 bpm, Variability: Good {> 6 bpm), Accelerations: Reactive, and Decelerations: Absent ?Uterine activityFrequency: Occasional ?Dilation: 3.5 ?Effacement (%): 70 ?Station: -1 ?Exam by:: Annitta Jersey, rn ?  ?  ?Prenatal labs: ?ABO, Rh: --/--/PENDING (03/20 1140) ?Antibody: PENDING (03/20 1140) ?Rubella: <0.90 (09/09 1009) ?RPR: Non Reactive (01/04 0835)  ?HBsAg: Negative (09/09 1009)  ?HIV: Non Reactive (01/04  0835)  ?GBS: Negative/-- (02/23 1134)  ?2 hr Glucola: 104 normal ?Genetic screening:  NIPS LR, Horizon negative ?Anatomy US: Nml female ?  ?Prenatal Transfer Tool  ?Maternal Diabetes: No ?Genetic Screening: Normal ?Maternal Ultrasounds/Referrals: Normal ?Fetal Ultrasounds or other Referrals:  Growth scan 28.4 normal (Hx of IUGR) ?Maternal Substance Abuse:  No ?Significant Maternal Medications:  None ?Significant Maternal Lab Results: Group B Strep negative ?  ?      ?Results for orders placed or performed during the hospital encounter of 03/14/22 (from the past 24 hour(s))  ?CBC  ?  Collection Time: 03/14/22 11:40 AM  ?Result Value Ref Range  ?  WBC 18.7 (H) 4.0 - 10.5 K/uL  ?  RBC 3.63 (L) 3.87 - 5.11 MIL/uL  ?  Hemoglobin 9.5 (L) 12.0 - 15.0 g/dL  ?  HCT 29.9 (L) 36.0 - 46.0 %  ?  MCV 82.4 80.0 - 100.0 fL  ?  MCH 26.2 26.0 - 34.0 pg  ?  MCHC 31.8 30.0 - 36.0 g/dL  ?  RDW 15.0 11.5 - 15.5 %  ?  Platelets 434 (H) 150 - 400 K/uL  ?  nRBC 0.0 0.0 - 0.2 %  ?Type and screen  ?  Collection Time: 03/14/22 11:40 AM  ?Result Value Ref Range  ?  ABO/RH(D) PENDING    ?  Antibody Screen PENDING    ?  Sample Expiration      ?    03/17/2022,2359 ?Performed at [facility] [place] [address], [place], [place] [address] ?   ?  ?  ?    ?Patient Active Problem List  ?  Diagnosis Date Noted  ? Encounter for induction of labor 03/14/2022  ? History of IUGR (intrauterine growth retardation) and stillbirth, currently pregnant, second trimester 10/27/2021  ? Supervision of other normal pregnancy, antepartum 09/03/2021  ? Obesity in pregnancy, antepartum 09/03/2021  ? Rubella non-immune status, antepartum 01/09/2021  ? Cigarette smoker 06/30/2020  ? Anxiety disorder affecting pregnancy, antepartum 06/30/2020  ?  ?  ?Assessment/Plan:  ?Shannon Ruiz is a 26 y.o. G3P1011 at 55w2dhere for elective IOL ?  ?#Labor: IOL starting with pitocin ?#Pain:  Plan for epidural ?[name]: Cat I ?#ID:      GBS negative ?#MOF: Breast ?#MOC: IUD  outpatient ?#Circ:  Desires inpatient ?  ?[name]  ?Elevated BP in clinic at 37 weeks (146/90)>elevated pressures here 149-152/80s.  preE labs ordered. Currently asymptomatic. ?-serial BP ?  ?#Rubella NI ?-MMR PP ? ?#Anxiety disorder- SW consult PP ?  ?Gerrit Heck, [place] [facility] [employer], [place] ?03/14/2022, 12:36 PM ?  ?Attestation of Supervision of Resident:  I confirm that I have verified the information documented in the resident?s note and that I have also personally performed the history, physical exam and all medical decision making activities.  I have verified that all services and findings are accurately documented in this note; and I agree with management and plan as outlined in the documentation. I have also made any necessary editorial changes. ? ? ?Lenoria Chime, MD ?Center for [facility], [place] ?03/14/2022 1:54 PM ? ?

## 2022-03-15 ENCOUNTER — Encounter (HOSPITAL_COMMUNITY): Payer: Self-pay | Admitting: Family Medicine

## 2022-03-15 LAB — CBC WITH DIFFERENTIAL/PLATELET
Abs Immature Granulocytes: 0.32 10*3/uL — ABNORMAL HIGH (ref 0.00–0.07)
Basophils Absolute: 0.1 10*3/uL (ref 0.0–0.1)
Basophils Relative: 0 %
Eosinophils Absolute: 0.1 10*3/uL (ref 0.0–0.5)
Eosinophils Relative: 1 %
HCT: 29.1 % — ABNORMAL LOW (ref 36.0–46.0)
Hemoglobin: 9.6 g/dL — ABNORMAL LOW (ref 12.0–15.0)
Immature Granulocytes: 1 %
Lymphocytes Relative: 10 %
Lymphs Abs: 2.1 10*3/uL (ref 0.7–4.0)
MCH: 26.8 pg (ref 26.0–34.0)
MCHC: 33 g/dL (ref 30.0–36.0)
MCV: 81.3 fL (ref 80.0–100.0)
Monocytes Absolute: 1.5 10*3/uL — ABNORMAL HIGH (ref 0.1–1.0)
Monocytes Relative: 7 %
Neutro Abs: 18.2 10*3/uL — ABNORMAL HIGH (ref 1.7–7.7)
Neutrophils Relative %: 81 %
Platelets: 391 10*3/uL (ref 150–400)
RBC: 3.58 MIL/uL — ABNORMAL LOW (ref 3.87–5.11)
RDW: 15.1 % (ref 11.5–15.5)
WBC: 22.3 10*3/uL — ABNORMAL HIGH (ref 4.0–10.5)
nRBC: 0.1 % (ref 0.0–0.2)

## 2022-03-15 LAB — BASIC METABOLIC PANEL
Anion gap: 11 (ref 5–15)
BUN: <5 mg/dL — ABNORMAL LOW (ref 6–20)
CO2: 18 mmol/L — ABNORMAL LOW (ref 22–32)
Calcium: 8.8 mg/dL — ABNORMAL LOW (ref 8.9–10.3)
Chloride: 108 mmol/L (ref 98–111)
Creatinine, Ser: 0.55 mg/dL (ref 0.44–1.00)
GFR, Estimated: >60 mL/min (ref >60)
Glucose, Bld: 95 mg/dL (ref 70–99)
Potassium: 2.9 mmol/L — ABNORMAL LOW (ref 3.5–5.1)
Sodium: 137 mmol/L (ref 135–145)

## 2022-03-15 LAB — MAGNESIUM: Magnesium: 1.4 mg/dL — ABNORMAL LOW (ref 1.7–2.4)

## 2022-03-15 LAB — RPR: RPR Ser Ql: NONREACTIVE

## 2022-03-15 MED ORDER — WITCH HAZEL-GLYCERIN EX PADS: 1.0000 "application " | MEDICATED_PAD | CUTANEOUS | Status: DC | PRN

## 2022-03-15 MED ORDER — PRENATAL MULTIVITAMIN CH
1.0000 | ORAL_TABLET | Freq: Every day | ORAL | Status: DC
  Administered 2022-03-15 – 2022-03-16 (×2): 1 via ORAL
  Filled 2022-03-15 (×3): qty 1

## 2022-03-15 MED ORDER — SODIUM CHLORIDE 0.9 % IV SOLN: INTRAVENOUS | Status: DC | PRN

## 2022-03-15 MED ORDER — NIFEDIPINE ER OSMOTIC RELEASE 30 MG PO TB24
60.0000 mg | ORAL_TABLET | Freq: Every day | ORAL | Status: DC
  Administered 2022-03-16: 60 mg via ORAL
  Filled 2022-03-15: qty 2

## 2022-03-15 MED ORDER — TETANUS-DIPHTH-ACELL PERTUSSIS 5-2.5-18.5 LF-MCG/0.5 IM SUSY: 0.5000 mL | PREFILLED_SYRINGE | Freq: Once | INTRAMUSCULAR | Status: DC

## 2022-03-15 MED ORDER — FUROSEMIDE 20 MG PO TABS
20.0000 mg | ORAL_TABLET | Freq: Every day | ORAL | Status: DC
  Administered 2022-03-15 – 2022-03-16 (×2): 20 mg via ORAL
  Filled 2022-03-15 (×2): qty 1

## 2022-03-15 MED ORDER — NIFEDIPINE ER OSMOTIC RELEASE 30 MG PO TB24
30.0000 mg | ORAL_TABLET | Freq: Every day | ORAL | Status: DC
  Administered 2022-03-15: 30 mg via ORAL
  Filled 2022-03-15: qty 1

## 2022-03-15 MED ORDER — POTASSIUM CHLORIDE 20 MEQ PO PACK
40.0000 meq | PACK | Freq: Once | ORAL | Status: AC
  Administered 2022-03-15: 40 meq via ORAL
  Filled 2022-03-15: qty 2

## 2022-03-15 MED ORDER — POTASSIUM CHLORIDE 20 MEQ PO PACK
40.0000 meq | PACK | Freq: Once | ORAL | Status: DC
  Filled 2022-03-15: qty 2

## 2022-03-15 MED ORDER — DIPHENHYDRAMINE HCL 25 MG PO CAPS: 25.0000 mg | ORAL_CAPSULE | Freq: Four times a day (QID) | ORAL | Status: DC | PRN

## 2022-03-15 MED ORDER — ONDANSETRON HCL 4 MG/2ML IJ SOLN
4.0000 mg | INTRAMUSCULAR | Status: DC | PRN
Start: 2022-03-15 — End: 2022-03-17

## 2022-03-15 MED ORDER — BENZOCAINE-MENTHOL 20-0.5 % EX AERO: 1.0000 "application " | INHALATION_SPRAY | CUTANEOUS | Status: DC | PRN

## 2022-03-15 MED ORDER — NIFEDIPINE ER OSMOTIC RELEASE 30 MG PO TB24
30.0000 mg | ORAL_TABLET | Freq: Once | ORAL | Status: AC
  Administered 2022-03-15: 30 mg via ORAL
  Filled 2022-03-15: qty 1

## 2022-03-15 MED ORDER — ONDANSETRON HCL 4 MG PO TABS: 4.0000 mg | ORAL_TABLET | ORAL | Status: DC | PRN

## 2022-03-15 MED ORDER — DIBUCAINE (PERIANAL) 1 % EX OINT: 1.0000 "application " | TOPICAL_OINTMENT | CUTANEOUS | Status: DC | PRN

## 2022-03-15 MED ORDER — FERROUS SULFATE 325 (65 FE) MG PO TABS
325.0000 mg | ORAL_TABLET | ORAL | Status: DC
  Administered 2022-03-15: 325 mg via ORAL
  Filled 2022-03-15: qty 1

## 2022-03-15 MED ORDER — MAGNESIUM SULFATE 2 GM/50ML IV SOLN
2.0000 g | Freq: Once | INTRAVENOUS | Status: AC
  Administered 2022-03-15: 2 g via INTRAVENOUS
  Filled 2022-03-15: qty 50

## 2022-03-15 MED ORDER — POTASSIUM CHLORIDE 20 MEQ PO PACK
40.0000 meq | PACK | Freq: Three times a day (TID) | ORAL | Status: DC
  Administered 2022-03-15 – 2022-03-16 (×4): 40 meq via ORAL
  Filled 2022-03-15 (×9): qty 2

## 2022-03-15 MED ORDER — SIMETHICONE 80 MG PO CHEW: 80.0000 mg | CHEWABLE_TABLET | ORAL | Status: DC | PRN

## 2022-03-15 MED ORDER — COCONUT OIL OIL: 1.0000 "application " | TOPICAL_OIL | Status: DC | PRN

## 2022-03-15 MED ORDER — SENNOSIDES-DOCUSATE SODIUM 8.6-50 MG PO TABS
2.0000 | ORAL_TABLET | Freq: Every day | ORAL | Status: DC
  Administered 2022-03-16: 2 via ORAL
  Filled 2022-03-15: qty 2

## 2022-03-15 MED ORDER — ACETAMINOPHEN 325 MG PO TABS: 650.0000 mg | ORAL_TABLET | ORAL | Status: DC | PRN

## 2022-03-15 MED ORDER — IBUPROFEN 600 MG PO TABS
600.0000 mg | ORAL_TABLET | Freq: Four times a day (QID) | ORAL | Status: DC
  Administered 2022-03-15 – 2022-03-16 (×7): 600 mg via ORAL
  Filled 2022-03-15 (×8): qty 1

## 2022-03-15 NOTE — Lactation Note (Signed)
This note was copied from a baby's chart. ?Lactation Consultation Note ? ?Patient Name: Shannon Ruiz ?Today's Date: 03/15/2022 ?Reason for consult: Follow-up assessment ?Age:26 hours ? ? Infant appears uncomfortable at the breast--cueing & latching, but then coming on & off (it could be from emeses). I encouraged Mom to get a deeper latch when able.  ? ?Mom has five to six 1-oz bags of colostrum at home.  ? ?Mom's 1st baby required Nutramigen.  ? ?Lactation to f/u later.  ? ? ?[name] ?03/15/2022, 4:38 PM ? ? ? ?

## 2022-03-15 NOTE — Social Work (Signed)
CSW received consult for hx of Anxiety. CSW met with MOB to offer support and complete assessment.   ? ?CSW met with MOB at bedside and introduced the CSW role. CSW observed MOB holding the infant, FOB asleep in the recliner and her support present with a toddler. MOB introduced her support as her best friend. CSW offered to return at different time. MOB welcomed CSW visit with FOB and her best friend present. MOB presented calm and engaged well during the visit. CSW inquired how MOB has felt since giving birth. MOB reported feeling good and shared she has no complaints, things well during the delivery. CSW inquired how MOB felt emotionally during the pregnancy. MOB reported that she felt pretty good and did not have much anxiety this pregnancy compared to her first pregnancy. MOB reported that she has a history of anxiety and was diagnosed at age 26. 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 MOB reported, "I wanted to run away and hide. I had thoughts of harming myself." MOB reported this time she was proactive and spoke with her provider about a postpartum plan and is open to taking medication if needed. CSW praised and acknowledged MOB efforts. MOB reported she also has a support system that she can rely on that includes her spouse and best friend. MOB reported she is not interested in therapy. CSW provided education regarding the baby blues period vs. perinatal mood disorders, discussed treatment and gave resources for mental health follow up if concerns arise.  CSW recommended MOB complete a self-evaluation during the postpartum time period using the New Mom Checklist from Postpartum Progress and encouraged MOB to contact a medical professional if symptoms are noted at any time. CSW assessed MOB for safety. MOB reported no thoughts of harm to self and others.  ? ?CSW provided review of Sudden Infant Death Syndrome (SIDS) precautions. MOB reported she has essential items for  the infant including a bassinet where the infant will sleep. MOB has chosen [facility] [place] for the infant's follow up care. CSW assessed MOB for additional needs. MOB reported no further need.  ? ?CSW identifies no further need for intervention and no barriers to discharge at this time.  ? ?Kathrin Greathouse, MSW, LCSW ?[facility] [place]  ?Clinical Social Worker  ?[phone] ?03/15/2022  2:28 PM  ?

## 2022-03-15 NOTE — Anesthesia Postprocedure Evaluation (Signed)
Anesthesia Post Note ? ?Patient: Shannon Ruiz ? ?Procedure(s) Performed: AN AD HOC LABOR EPIDURAL ? ?  ? ?Patient location during evaluation: Mother Baby ?Anesthesia Type: Epidural ?Level of consciousness: awake and alert ?Pain management: pain level controlled ?Vital Signs Assessment: post-procedure vital signs reviewed and stable ?Respiratory status: spontaneous breathing, nonlabored ventilation and respiratory function stable ?Cardiovascular status: stable ?Postop Assessment: no headache, no backache and epidural receding ?Anesthetic complications: no ? ? ?No notable events documented. ? ?Last Vitals:  ?Vitals:  ? 03/15/22 0116 03/15/22 0131  ?BP: (!) 140/95 130/73  ?Pulse: 86 (!) 121  ?Resp:    ?Temp:    ?SpO2:    ?  ?Last Pain:  ?Vitals:  ? 03/14/22 2249  ?TempSrc:   ?PainSc: 3   ? ? ?  ?  ?  ?  ?  ?  ? ?Makai Agostinelli ? ? ? ? ?

## 2022-03-15 NOTE — Lactation Note (Incomplete)
This note was copied from a baby's chart. ?Lactation Consultation Note ? ?Patient Name: Shannon Ruiz ?Today's Date: 03/15/2022 ?Reason for consult: Initial assessment;Term;Breastfeeding assistance ?Age:26 years ? ?[employer] student in to see P2 mom with 26 hour old term female infant "Shannon Ruiz". Mom was getting baby latched as I entered the room. Mom reported she breastfed for a few weeks with her last baby but her pediatrician told her she had a dairy allergy so she discontinued. She has been harvesting colostrum since [redacted] weeks gestation. ? ?Mom latched infant in cross cradle hold on the right breast and infant latched right away. Mom supported her breast in an adequate manner to help infant sustain latch. Mom reported 1 stool, 0 voids today. ? ?Reviewed breastfeeding basics, normal newborn behavior first 24hr, and input/output expectations. ? ?Plan: ?EBF on cue 8-12x per 24hr. ?Prioritize skin to skin, rest, nutrition, and hydration.  ?Call lactation PRN for feeding assist. ?Follow up tomorrow.  ? ?Maternal Data ?Has patient been taught Hand Expression?: Yes ?Does the patient have breastfeeding experience prior to this delivery?: Yes ?How Andres did the patient breastfeed?: She only breastfed for a few weeks because her pediatrician told her that her baby had a dairy allergy. ? ?Feeding ?Mother's Current Feeding Choice: Breast Milk ? ?LATCH Score ?Latch: Grasps breast easily, tongue down, lips flanged, rhythmical sucking. ? ?Audible Swallowing: A few with stimulation ? ?Type of Nipple: Everted at rest and after stimulation ? ?Comfort (Breast/Nipple): Soft / non-tender ? ?Hold (Positioning): No assistance needed to correctly position infant at breast. ? ?LATCH Score: 9 ? ? ?Interventions ?Interventions: Breast feeding basics reviewed;Assisted with latch;Breast massage;Skin to skin;Support pillows;Adjust position;Education;LC Services brochure ? ?Discharge ?Pump: Personal ? ?Consult Status ?Consult Status: Follow-up ?Date:  03/16/22 ?Follow-up type: In-patient ? ? ? ?Jovita Gamma ?03/15/2022, 11:03 AM ? ? ? ?

## 2022-03-15 NOTE — Lactation Note (Signed)
This note was copied from a baby's chart. ?Lactation Consultation Note ? ?Patient Name: Shannon Ruiz ?Today's Date: 03/15/2022 ?Reason for consult: Mother's request;Difficult latch;Follow-up assessment;Term ?Age:26 hours ?P2, term female infant. ?Per mom, 1st child had milk allergy and she did not BF, 1st child is currently 18 months. ?Mom has pseudo invert nipples that responds well to breast stimulation. ?Mom latched infant on her left breast using the cross cradle hold, sustained latch with depth and was still BF after 14 minutes when LC left room. ?Mom knows to breast feed infant on both breast during a feeding. ?Mom knows to call RN/LC if she need further assistance with latching infant at the breast.  ?Mom knows to breast feed infant according to hunger cues, 8 to 12+ or more times, skin to skin. ?Mom made aware of O/P services, breastfeeding support groups, community resources, and our phone # for post-discharge questions.   ?Maternal Data ?Has patient been taught Hand Expression?: Yes ? ?Feeding ?Mother's Current Feeding Choice: Breast Milk ? ?LATCH Score ?Latch: Grasps breast easily, tongue down, lips flanged, rhythmical sucking. ? ?Audible Swallowing: Spontaneous and intermittent ? ?Type of Nipple: Inverted (Pseudo invert nipples that evert outward when stimulated.) ? ?Comfort (Breast/Nipple): Soft / non-tender ? ?Hold (Positioning): Assistance needed to correctly position infant at breast and maintain latch. ? ?LATCH Score: 7 ? ? ?Lactation Tools Discussed/Used ?  ? ?Interventions ?Interventions: Skin to skin;Assisted with latch;Breast compression;Adjust position;Support pillows;Position options;Expressed milk;Education;LC Services brochure ? ?Discharge ?  ? ?Consult Status ?Consult Status: Follow-up ?Date: 03/16/22 ?Follow-up type: In-patient ? ? ? ?Danelle Earthly ?03/15/2022, 6:29 PM ? ? ? ?

## 2022-03-15 NOTE — Progress Notes (Signed)
Patient's blood pressure continued to increase through out the night.  MD notified of increasing blood pressures.  MD states that patient's potassium was low while in labor and delivery and would like to wait until BNP comes back before prescribing anything for the blood pressure.  MD also states that the doctors rounding today will see the patient this morning. ? ? ?Darrick Grinder, RN ?

## 2022-03-15 NOTE — Progress Notes (Signed)
POSTPARTUM PROGRESS NOTE ? ?Post Partum Day 1 ? ?Subjective: ? ?Shannon Ruiz is a 26 y.o. [id number] s/p SVD at [redacted]w[redacted]d after eIOL with gHTN.  She reports she is doing well. No acute events overnight. She denies any problems with ambulating, voiding or po intake. Denies nausea or vomiting.  Pain is well controlled.  Lochia is mild. ? ?Objective: ?Blood pressure 140/89, pulse 84, temperature 98.9 ?F (37.2 ?C), temperature source Oral, resp. rate 20, height 5\' 3"  (1.6 m), weight 104.6 kg, last menstrual period 06/05/2021, SpO2 99 %, unknown if currently breastfeeding. ? ?Physical Exam:  ?General: alert, cooperative and no distress ?Chest: no respiratory distress ?Heart:regular rate, distal pulses intact ?Uterine Fundus: firm, appropriately tender ?DVT Evaluation: No calf swelling or tenderness ?Extremities: no lower extremity edema ?Skin: warm, dry ? ?Recent Labs  ?  03/14/22 ?1745 03/15/22 ?0110  ?HGB 9.8* 9.6*  ?HCT 30.4* 29.1*  ? ? ?Assessment/Plan: ?Shannon Ruiz is a 26 y.o. [id number] s/p SVD at [redacted]w[redacted]d after eIOL with gHTN ? ?PPD#1 - Doing well ? Routine postpartum care ?Contraception: outpatient IUD ?Feeding: Breast ?Dispo: Plan for discharge likely tomorrow given late delivery time. ? ?#Hypokalemia: K 2.6 -> 2.9. Given 25mEq K after AM BMP was drawn. Plan for another 85mEq today and will check magnesium and replete as needed. ? - 40 mEq K ? - Check Mg ? - Repeat BMP 3/22 ? ?#Gestational Hypertension: PreE labs normal on admission. Plan to continue monitoring blood pressures. Given hypokalemia plan to start lasix on 3/22 after repeat BMP. ? ? LOS: 1 day  ? ?Daniel Nones, MD ?FM PGY-1 ?03/15/2022, 7:21 AM  ?

## 2022-03-15 NOTE — Lactation Note (Signed)
This note was copied from a baby's chart. ?Lactation Consultation Note ? ?Patient Name: Shannon Ruiz ?Today's Date: 03/15/2022 ?Reason for consult: Initial assessment;Term;Breastfeeding assistance ?Age:26 years ? ?A staff person from [facility] let me know that Mom was sleeping with baby. I went into room and woke Mom up. She said she didn't mean to fall asleep with the baby. Mom said she isn't planning on falling asleep again soon; Mom said she is going to get out of bed to go to bathroom, etc.  ? ? ?Matthias [place] [employer] ?03/15/2022, 11:48 AM ? ? ? ?

## 2022-03-15 NOTE — Lactation Note (Signed)
This note was copied from a baby's chart. ?Lactation Consultation Note ?Mom had baby on the breast and BF when LC entered rm. ?Baby very congested and noted labored breathing. ?Removed baby from breast and placed STS on mom's chest. [place] notified RN of baby status. ?Praised mom for such a good latch and will f/u on MBU. ? ?Patient Name: Shannon Ruiz ?Today's Date: 03/15/2022 ?Reason for consult: L&D Initial assessment ?Age:26 hours ? ?Maternal Data ?  ? ?Feeding ?  ? ?LATCH Score ?Latch: Grasps breast easily, tongue down, lips flanged, rhythmical sucking. ? ?Audible Swallowing: None ? ?Type of Nipple: Everted at rest and after stimulation ? ?Comfort (Breast/Nipple): Soft / non-tender ? ?Hold (Positioning): No assistance needed to correctly position infant at breast. ? ?LATCH Score: 8 ? ? ?Lactation Tools Discussed/Used ?  ? ?Interventions ?Interventions: Skin to skin;Support pillows;Adjust position ? ?Discharge ?  ? ?Consult Status ?Consult Status: Follow-up from L&D ?Date: 03/15/22 ?Follow-up type: In-patient ? ? ? ?Theodoro Kalata ?03/15/2022, 12:46 AM ? ? ? ?

## 2022-03-16 ENCOUNTER — Other Ambulatory Visit (HOSPITAL_COMMUNITY): Payer: Self-pay

## 2022-03-16 LAB — BASIC METABOLIC PANEL
Anion gap: 10 (ref 5–15)
BUN: 6 mg/dL (ref 6–20)
CO2: 19 mmol/L — ABNORMAL LOW (ref 22–32)
Calcium: 8.5 mg/dL — ABNORMAL LOW (ref 8.9–10.3)
Chloride: 109 mmol/L (ref 98–111)
Creatinine, Ser: 0.45 mg/dL (ref 0.44–1.00)
GFR, Estimated: >60 mL/min (ref >60)
Glucose, Bld: 84 mg/dL (ref 70–99)
Potassium: 3.1 mmol/L — ABNORMAL LOW (ref 3.5–5.1)
Sodium: 138 mmol/L (ref 135–145)

## 2022-03-16 LAB — MAGNESIUM: Magnesium: 1.6 mg/dL — ABNORMAL LOW (ref 1.7–2.4)

## 2022-03-16 LAB — BIRTH TISSUE RECOVERY COLLECTION (PLACENTA DONATION)

## 2022-03-16 MED ORDER — POTASSIUM CHLORIDE CRYS ER 20 MEQ PO TBCR
40.0000 meq | EXTENDED_RELEASE_TABLET | Freq: Two times a day (BID) | ORAL | 0 refills | Status: AC
Start: 2022-03-16 — End: 2022-03-19
  Filled 2022-03-16: qty 12, 3d supply, fill #0

## 2022-03-16 MED ORDER — FUROSEMIDE 20 MG PO TABS
20.0000 mg | ORAL_TABLET | Freq: Every day | ORAL | 0 refills | Status: DC
  Filled 2022-03-16: qty 3, 3d supply, fill #0

## 2022-03-16 MED ORDER — IBUPROFEN 600 MG PO TABS
600.0000 mg | ORAL_TABLET | Freq: Four times a day (QID) | ORAL | 0 refills | Status: DC | PRN
Start: 2022-03-16 — End: 2022-12-14
  Filled 2022-03-16: qty 40, 10d supply, fill #0

## 2022-03-16 MED ORDER — ACETAMINOPHEN 500 MG PO TABS
1000.0000 mg | ORAL_TABLET | Freq: Three times a day (TID) | ORAL | 0 refills | Status: DC | PRN
  Filled 2022-03-16: qty 60, 10d supply, fill #0

## 2022-03-16 MED ORDER — NIFEDIPINE ER 60 MG PO TB24
60.0000 mg | ORAL_TABLET | Freq: Every day | ORAL | 0 refills | Status: DC
  Filled 2022-03-16: qty 30, 30d supply, fill #0

## 2022-03-16 MED ORDER — MEASLES, MUMPS & RUBELLA VAC IJ SOLR
0.5000 mL | Freq: Once | INTRAMUSCULAR | Status: AC
  Administered 2022-03-16: 0.5 mL via SUBCUTANEOUS
  Filled 2022-03-16: qty 0.5

## 2022-03-16 NOTE — Lactation Note (Signed)
This note was copied from a baby's chart. ?Lactation Consultation Note ? ?Patient Name: Shannon Ruiz ?Today's Date: 03/16/2022 ?Reason for consult: Follow-up assessment;Primapara;1st time breastfeeding;Term;Infant weight loss;Other (Comment) (7 % weight loss , per mom was in the nursery for a circ, and was not done due the baby spitting a large amount. Per  [facility] [place] [facility] - it was neon color  , so the baby is going for UGI test . Dyad needs LC F/U today after test) ?Age:70 hours ?As of now D/C on hold.  ?Maternal Data ?  ? ?Feeding ?Mother's Current Feeding Choice: Breast Milk ? ?LATCH Score ?  ? ?  ? ?  ? ?  ? ?  ? ?  ? ? ?Lactation Tools Discussed/Used ?  ? ?Interventions ?Interventions: Breast feeding basics reviewed;Education ? ?Discharge ?  ? ?Consult Status ?Consult Status: Follow-up ?Date: 03/16/22 ?Follow-up type: In-patient ? ? ? ?Matilde Sprang Mithcell Schumpert ?03/16/2022, 12:10 PM ? ? ? ?

## 2022-03-16 NOTE — Lactation Note (Addendum)
This note was copied from a baby's chart. ?Lactation Consultation Note ? ?Patient Name: Shannon Ruiz ?Today's Date: 03/16/2022 ?Reason for consult: Follow-up assessment;Term ?Age:26 hours ?Per mom, infant has been latching well was circumcised earlier. ?Infant stool  is transiting to greenish color that dad plans to change.  ?LC entered room, infant was cuing to BF, mom able latch infant without pre-pump breast with hand pump now. ?Mom latched infant on her  left breast using the cross cradle hold, swallows heard, infant latched with depth and BF for 15 minutes.  ?Mom will continue to breastfeed infant according to hunger cues, 8 to 12 + or more times within 24 hours, skin to skin. ?Afterwards mom used hand pump and infant was given 5 mls of EBM using white Nfant nipple. ?Per mom, she has 8 ounces of EBM at home that she pumped in her pregnancy, she plans to supplement infant with her EBM after latching infant at the breast to help stabilize weight loss.  ?Mom does have DEBP at home. ?LC reviewed breast milk is safe at room temperature up to 4 hours, in bottom fridge 4 days, top fridge near ice up to 6 months and deep freeze  celsius up to one year.  ?LC discussed with mom discharge education: engorgement prevent and treatment, signs of dehydration in infant , infant's input and output and how to know if breastfeeding is going well. ?LC reviewed outpatient resources for breastfeeding within the community: [facility] hotline, [facility] outpatient clinic, online BF support group and online resource " [email or web address]." ?Infant has Pediatrician, MD  [facility].  ? ?Maternal Data ?  ? ?Feeding ?Mother's Current Feeding Choice: Breast Milk ? ?LATCH Score ?Latch: Grasps breast easily, tongue down, lips flanged, rhythmical sucking. ? ?Audible Swallowing: Spontaneous and intermittent ? ?Type of Nipple: Everted at rest and after stimulation ? ?Comfort (Breast/Nipple): Soft / non-tender ? ?Hold  (Positioning): Assistance needed to correctly position infant at breast and maintain latch. ? ?LATCH Score: 9 ? ? ?Lactation Tools Discussed/Used ?Tools: Pump ?Breast pump type: Manual ?Reason for Pumping: help evert nipple shaft out more prior to latching infant. ?Pumping frequency: prn ?Pumped volume: 5 mL ? ?Interventions ?Interventions: Skin to skin;Breast compression;Assisted with latch;Position options;Expressed milk;Education;Hand pump ? ?Discharge ?Discharge Education: Engorgement and breast care;Warning signs for feeding baby ? ?Consult Status ?Consult Status: Complete ?Date: 03/16/22 ?Follow-up type: Physician ? ? ? ?Danelle Earthly ?03/16/2022, 5:33 PM ? ? ? ?

## 2022-03-23 ENCOUNTER — Inpatient Hospital Stay (HOSPITAL_COMMUNITY): Payer: Medicaid Other

## 2022-03-23 ENCOUNTER — Inpatient Hospital Stay (HOSPITAL_COMMUNITY)
Admission: AD | Admit: 2022-03-23 | Discharge: 2022-03-23 | Disposition: A | Discharge status: Home / Self Care | Payer: Medicaid Other | Attending: Obstetrics and Gynecology | Admitting: Obstetrics and Gynecology

## 2022-03-23 ENCOUNTER — Encounter (HOSPITAL_COMMUNITY): Payer: Self-pay | Admitting: Obstetrics and Gynecology

## 2022-03-23 ENCOUNTER — Ambulatory Visit: Payer: Medicaid Other | Admitting: Family Medicine

## 2022-03-23 ENCOUNTER — Other Ambulatory Visit: Payer: Self-pay

## 2022-03-23 DIAGNOSIS — Z87891 Personal history of nicotine dependence: Secondary | ICD-10-CM | POA: Insufficient documentation

## 2022-03-23 DIAGNOSIS — N719 Inflammatory disease of uterus, unspecified: Secondary | ICD-10-CM

## 2022-03-23 DIAGNOSIS — O8612 Endometritis following delivery: Secondary | ICD-10-CM | POA: Insufficient documentation

## 2022-03-23 LAB — COMPREHENSIVE METABOLIC PANEL WITH GFR
ALT: 11 U/L (ref 0–44)
AST: 14 U/L — ABNORMAL LOW (ref 15–41)
Albumin: 2.9 g/dL — ABNORMAL LOW (ref 3.5–5.0)
Alkaline Phosphatase: 80 U/L (ref 38–126)
Anion gap: 7 (ref 5–15)
BUN: 15 mg/dL (ref 6–20)
CO2: 20 mmol/L — ABNORMAL LOW (ref 22–32)
Calcium: 9.2 mg/dL (ref 8.9–10.3)
Chloride: 111 mmol/L (ref 98–111)
Creatinine, Ser: 0.56 mg/dL (ref 0.44–1.00)
GFR, Estimated: >60 mL/min
Glucose, Bld: 101 mg/dL — ABNORMAL HIGH (ref 70–99)
Potassium: 3.1 mmol/L — ABNORMAL LOW (ref 3.5–5.1)
Sodium: 138 mmol/L (ref 135–145)
Total Bilirubin: 0.5 mg/dL (ref 0.3–1.2)
Total Protein: 6.5 g/dL (ref 6.5–8.1)

## 2022-03-23 LAB — CBC
HCT: 32.8 % — ABNORMAL LOW (ref 36.0–46.0)
Hemoglobin: 10.4 g/dL — ABNORMAL LOW (ref 12.0–15.0)
MCH: 26.3 pg (ref 26.0–34.0)
MCHC: 31.7 g/dL (ref 30.0–36.0)
MCV: 83 fL (ref 80.0–100.0)
Platelets: 648 K/uL — ABNORMAL HIGH (ref 150–400)
RBC: 3.95 MIL/uL (ref 3.87–5.11)
RDW: 15.2 % (ref 11.5–15.5)
WBC: 18 K/uL — ABNORMAL HIGH (ref 4.0–10.5)
nRBC: 0 % (ref 0.0–0.2)

## 2022-03-23 MED ORDER — CEPHALEXIN 500 MG PO CAPS: 500.0000 mg | ORAL_CAPSULE | Freq: Four times a day (QID) | ORAL | 0 refills | Status: AC

## 2022-03-23 MED ORDER — SODIUM CHLORIDE 0.9 % IV SOLN: 250.0000 mL | INTRAVENOUS | Status: DC | PRN

## 2022-03-23 MED ORDER — MISOPROSTOL 200 MCG PO TABS
800.0000 ug | ORAL_TABLET | Freq: Once | ORAL | Status: AC
  Administered 2022-03-23: 800 ug via ORAL
  Filled 2022-03-23: qty 4

## 2022-03-23 MED ORDER — SODIUM CHLORIDE 0.9% FLUSH: 3.0000 mL | INTRAVENOUS | Status: DC | PRN

## 2022-03-23 MED ORDER — SODIUM CHLORIDE 0.9% FLUSH
3.0000 mL | Freq: Two times a day (BID) | INTRAVENOUS | Status: DC
  Administered 2022-03-23: 3 mL via INTRAVENOUS

## 2022-03-23 MED ORDER — LACTATED RINGERS IV BOLUS
1000.0000 mL | Freq: Once | INTRAVENOUS | Status: AC
  Administered 2022-03-23: 1000 mL via INTRAVENOUS

## 2022-03-23 MED ORDER — NIFEDIPINE ER OSMOTIC RELEASE 30 MG PO TB24
60.0000 mg | ORAL_TABLET | Freq: Every day | ORAL | Status: DC
  Administered 2022-03-23: 60 mg via ORAL
  Filled 2022-03-23: qty 2

## 2022-03-23 MED ORDER — NIFEDIPINE ER OSMOTIC RELEASE 30 MG PO TB24: 60.0000 mg | ORAL_TABLET | Freq: Every day | ORAL | Status: DC

## 2022-03-23 NOTE — Discharge Instructions (Signed)
Return to MAU: ?If you have heavier bleeding that soaks through more that 2 pads per hour for an hour or more ?If you bleed so much that you feel like you might pass out or you do pass out ?If you have significant abdominal pain that is not improved with Tylenol 1000 mg every 8 hours as needed for pain ?If you develop a fever > 100.5 ? ?You will need to schedule an appointment to see one of your  OB doctors after you completely finish taking the antibiotic. ?

## 2022-03-23 NOTE — MAU Provider Note (Signed)
?History  ?  ? ?CSN: [id number] ? ?Arrival date and time: 03/23/22 0210 ? ? Event Date/Time  ? First Provider Initiated Contact with Patient 03/23/22 0245   ?  ? ?Chief Complaint  ?Patient presents with  ? Vaginal Bleeding  ? ?[name] is a 26 y.o. year old G42P2012 female at 8 days PP who presents to MAU via EMS reporting bright, red VB that started out as a "gush" of blood around 2330 last night. She had a SVD on 03/15/2022 with no laceration repair. She reports that she has completely saturated 2 pads in one hour and passed golf ball sized clots. She reports abdominal cramping "like menstrual." She feels dizzy with movement. She had gHTN and was Rx'd Procardia XL 60 mg daily. She missed her the dose for Tuesday morning. Her spouse is present and contributing to the history taking.  ? ? ?OB History   ? ? Gravida  ?3  ? Para  ?2  ? Term  ?2  ? Preterm  ?   ? AB  ?1  ? Living  ?2  ?  ? ? SAB  ?1  ? IAB  ?   ? Ectopic  ?   ? Multiple  ?0  ? Live Births  ?2  ?   ?  ?  ? ? ?Past Medical History:  ?Diagnosis Date  ? Anemia   ? Anxiety   ? Depression   ? ? ?Past Surgical History:  ?Procedure Laterality Date  ? NO PAST SURGERIES    ? ? ?Family History  ?Problem Relation Age of Onset  ? Anxiety disorder Mother   ? Hypertension Mother   ? Heart Problems Mother   ? Diabetes Father   ? ? ?Social History  ? ?Tobacco Use  ? Smoking status: Former  ?  Types: Cigarettes  ? Smokeless tobacco: Never  ?Vaping Use  ? Vaping Use: Every day  ?Substance Use Topics  ? Alcohol use: Never  ? Drug use: Never  ? ? ?Allergies: No Known Allergies ? ?No medications prior to admission.  ? ? ?Review of Systems  ?Constitutional: Negative.   ?HENT: Negative.    ?Eyes: Negative.   ?Respiratory: Negative.    ?Cardiovascular: Negative.   ?Gastrointestinal: Negative.   ?Endocrine: Negative.   ?Genitourinary:  Positive for pelvic pain (cramping) and vaginal bleeding (saturating 2 pads per hour).  ?Musculoskeletal: Negative.   ?Skin: Negative.    ?Allergic/Immunologic: Negative.   ?Neurological:  Positive for dizziness (with movement).  ?Hematological: Negative.   ?Psychiatric/Behavioral: Negative.    ?Physical Exam  ? ?Patient Vitals for the past 24 hrs: ? BP Temp Temp src Pulse Resp SpO2 Height Weight  ?03/23/22 0435 135/82 -- -- 96 -- -- -- --  ?03/23/22 0403 (!) 146/97 -- -- (!) 107 -- -- -- --  ?03/23/22 0300 (!) 151/104 -- -- (!) 106 -- -- -- --  ?03/23/22 0259 (!) 141/99 -- -- (!) 128 -- -- -- --  ?03/23/22 0257 -- -- -- (!) 135 -- -- -- --  ?03/23/22 0255 (!) 141/97 -- -- (!) 123 -- 98 % -- --  ?03/23/22 0233 135/89 -- -- (!) 106 -- -- -- --  ?03/23/22 0223 (!) 140/101 98.2 ?F (36.8 ?C) Oral (!) 106 16 99 % 5\' 3"  (1.6 m) 92.5 kg  ?Orthostatic VS: Lying 140/96, 105  Sitting 141/97, 107  Standing (0 mins) 145/101, 130  Standing (3 mins) 141/99, 129 ? ?Physical Exam ?Vitals and nursing note reviewed.  Exam conducted with a chaperone present.  ?Constitutional:   ?   Appearance: Normal appearance. She is normal weight.  ?Genitourinary: ?   General: Normal vulva.  ?   Comments: Pelvic exam: External genitalia normal, SE: vaginal walls pink and well rugated, cervix is smooth, pink, no lesions, large amt of BRB cleared out of vagina with 4 large cotton tipped swabs -- multiparous cervix visualized, Uterus is tender, no CMT or friability, no adnexal tenderness.  ?Musculoskeletal:     ?   General: Normal range of motion.  ?Skin: ?   General: Skin is warm and dry.  ?Neurological:  ?   Mental Status: She is alert and oriented to person, place, and time.  ?Psychiatric:     ?   Mood and Affect: Mood normal.     ?   Behavior: Behavior normal.     ?   Thought Content: Thought content normal.     ?   Judgment: Judgment normal.  ? ? ?MAU Course  ?Procedures ? ?MDM ?CBC ?CMP ?P/C Ratio ?Serial BP's  ?Orthostatic VS ?Procardia XL 60 mg po -- improvement in BP ? ?Results for orders placed or performed during the hospital encounter of 03/23/22 (from the past 24  hour(s))  ?CBC     Status: Abnormal  ? Collection Time: 03/23/22  3:12 AM  ?Result Value Ref Range  ? WBC 18.0 (H) 4.0 - 10.5 K/uL  ? RBC 3.95 3.87 - 5.11 MIL/uL  ? Hemoglobin 10.4 (L) 12.0 - 15.0 g/dL  ? HCT 32.8 (L) 36.0 - 46.0 %  ? MCV 83.0 80.0 - 100.0 fL  ? MCH 26.3 26.0 - 34.0 pg  ? MCHC 31.7 30.0 - 36.0 g/dL  ? RDW 15.2 11.5 - 15.5 %  ? Platelets 648 (H) 150 - 400 K/uL  ? nRBC 0.0 0.0 - 0.2 %  ?Comprehensive metabolic panel     Status: Abnormal  ? Collection Time: 03/23/22  3:12 AM  ?Result Value Ref Range  ? Sodium 138 135 - 145 mmol/L  ? Potassium 3.1 (L) 3.5 - 5.1 mmol/L  ? Chloride 111 98 - 111 mmol/L  ? CO2 20 (L) 22 - 32 mmol/L  ? Glucose, Bld 101 (H) 70 - 99 mg/dL  ? BUN 15 6 - 20 mg/dL  ? Creatinine, Ser 0.56 0.44 - 1.00 mg/dL  ? Calcium 9.2 8.9 - 10.3 mg/dL  ? Total Protein 6.5 6.5 - 8.1 g/dL  ? Albumin 2.9 (L) 3.5 - 5.0 g/dL  ? AST 14 (L) 15 - 41 U/L  ? ALT 11 0 - 44 U/L  ? Alkaline Phosphatase 80 38 - 126 U/L  ? Total Bilirubin 0.5 0.3 - 1.2 mg/dL  ? GFR, Estimated >60 >60 mL/min  ? Anion gap 7 5 - 15  ?   ? ?US PELVIS (TRANSABDOMINAL ONLY) ? ?Result Date: 03/23/2022 ?CLINICAL DATA:  Heavy postpartum bleeding. Vaginal delivery on 03/14/2022. EXAM: TRANSABDOMINAL ULTRASOUND OF PELVIS TECHNIQUE: Transabdominal ultrasound examination of the pelvis was performed including evaluation of the uterus, ovaries, adnexal regions, and pelvic cul-de-sac. COMPARISON:  None. FINDINGS: Uterus Measurements: 16.5 x 9.7 x 10.9 cm. The uterus is anteverted appears unremarkable. Endometrium Thickness: 6 cm. The endometrium is thickened and heterogeneous. Minimal focus of vascularity noted within the endometrium. This may represent blood clot but concerning for retained product of conception. Correlation with clinical exam and HCG levels recommended. Right ovary Measurements: 2.7 x 1.7 x 2.5 cm = volume: 6 mL. Normal appearance/no adnexal mass.  Left ovary Measurements: 2.2 x 1.3 x 2.6 cm = volume: 4 mL. Normal  appearance/no adnexal mass. Other findings:  No abnormal free fluid. IMPRESSION: Thickened and heterogeneous endometrium may represent blood products/clot. Retained product of conception is not excluded. Clinical correlation recommended. Electronically Signed   By: Elgie CollardArash  Radparvar M.D.   On: 03/23/2022 03:54    ? ?*Consult with Dr. Jolayne Pantheronstant @ 0400 - notified of patient's complaints, assessments, lab & U/S results, recommended tx plan give dose of Cytotec 800 mcg prior to d/c home, Rx Keflex 500 mg QID x 7 days for endometritis and f/u with MD after completion of abx  - ok to d/c home ?Assessment and Plan  ?Retained products of conception, postpartum  ?- Advised of retained POC and Cytotec dose should help expel the POC ? ?Endometritis  ?- Information provided on endometritis  ?- Rx for Keflex 500 mg QID x 7 days ?  ?Delay postpartum hemorrhage  ?- Return to MAU: ?If you have heavier bleeding that soaks through more that 2 pads per hour for an hour or more ?If you bleed so much that you feel like you might pass out or you do pass out ?If you have significant abdominal pain that is not improved with Tylenol 1000 mg every 8 hours as needed for pain ?If you develop a fever > 100.5  ? ?- Discharge patient  ?- [facility] ?- Patient verbalized an understanding of the plan of care and agrees.  ? ?Raelyn Moraolitta Simcha Speir, CNM ?03/23/2022, 2:56 AM  ?

## 2022-03-23 NOTE — MAU Note (Signed)
.  Shannon Ruiz is a 26 y.o.  arrived in MAU via EMS at approximately 0210. Patient reporting postpartum bright red vaginal bleeding that started getting heavier yesterday after a "gush" of blood that happened around 2330 on 03/22/22. EMS came to evaluate the patient and she said that the bleeding slowed down but returned later this morning so she called EMS a second time. Reports "golf ball" sized clots and saturation of two pads in one hour. Abdominal cramping that started in her lower stomach and has now moved up her abdomen  6/10. Abdomen tender to touch. Pt has felt dizzy with movement. Transferred to MAU bed, standby assist. RN noted blood dripping down the patient's leg and onto the floor as the patient stood up. Patient reports having PP HTN for which she was prescribed Nifedipine. She reports missing her dose for Tuesday morning.  ? ?Onset of complaint: 2330 on 03/22/2022 ?Pain score: 6/10 ?Vitals:  ? 03/23/22 0223  ?BP: (!) 140/101  ?Pulse: (!) 106  ?Resp: 16  ?Temp: 98.2 ?F (36.8 ?C)  ?SpO2: 99%  ?   ? ? ?

## 2022-03-24 ENCOUNTER — Telehealth (HOSPITAL_COMMUNITY): Payer: Self-pay | Admitting: *Deleted

## 2022-03-24 NOTE — Telephone Encounter (Signed)
Hospital Discharge Follow-Up Call: ? ?Patient reports that she is doing well after being seen in MAU on 03/23/22 for a PP hemorrhage.  She says her bleeding has decreased some and she denies headache, weakness, or dizziness.  She says she is taking antibiotics prescribed for uterine infection.  She feels like things are stable.  EPDS today was 2 and she endorses this accurately reflects that she is doing well emotionally. ? ?Patient says that baby is well and she has no concerns about baby's health.  She reports that baby sleeps in a bassinet.  Reviewed ABCs of Safe Sleep. ?

## 2022-03-31 ENCOUNTER — Ambulatory Visit: Payer: Medicaid Other | Admitting: Family Medicine

## 2022-03-31 ENCOUNTER — Ambulatory Visit (INDEPENDENT_AMBULATORY_CARE_PROVIDER_SITE_OTHER): Payer: Medicaid Other | Admitting: Family Medicine

## 2022-03-31 ENCOUNTER — Encounter: Payer: Self-pay | Admitting: Family Medicine

## 2022-03-31 NOTE — Progress Notes (Signed)
? ?  Subjective:  ? ? Patient ID: Shannon Ruiz, female    DOB: 1996-03-04, 26 y.o.   MRN: [id number] ? ?HPI ?Patient is approximately 2 and half weeks postpartum from vaginal delivery.  8 days after her delivery, she presented to the hospital after hemorrhage at home and was found to have retained products of conception.  She was given Cytotec and sent home after taking the Cytotec at home, she had not cramping in the past a small egg sized piece of tissue.  Since then, she has had no bleeding or abdominal pain. ? ? ?Review of Systems ? ?   ?Objective:  ? Physical Exam ?Vitals reviewed.  ?Constitutional:   ?   Appearance: Normal appearance.  ?Cardiovascular:  ?   Rate and Rhythm: Normal rate and regular rhythm.  ?   Pulses: Normal pulses.  ?Pulmonary:  ?   Effort: Pulmonary effort is normal.  ?   Breath sounds: Normal breath sounds.  ?Abdominal:  ?   General: Abdomen is flat.  ?   Palpations: Abdomen is soft.  ?   Tenderness: There is no abdominal tenderness. There is no guarding.  ?Skin: ?   General: Skin is warm and dry.  ?   Capillary Refill: Capillary refill takes less than 2 seconds.  ?Neurological:  ?   General: No focal deficit present.  ?   Mental Status: She is alert.  ?Psychiatric:     ?   Mood and Affect: Mood normal.     ?   Behavior: Behavior normal.     ?   Thought Content: Thought content normal.     ?   Judgment: Judgment normal.  ? ?   ?Assessment & Plan:  ? ?1. Retained products of conception, postpartum ?Check [place]. ?- Basic metabolic panel ?- US PELVIS TRANSVAGINAL NON-OB (TV ONLY); Future ? ?

## 2022-03-31 NOTE — Progress Notes (Signed)
Patient is 2 weeks postpartum and presents for BP check. Patient is currently breastfeeding.Patient had complications of retained products of conception one week ago.  [name] RN  ?

## 2022-04-01 ENCOUNTER — Ambulatory Visit (HOSPITAL_BASED_OUTPATIENT_CLINIC_OR_DEPARTMENT_OTHER)
Admission: RE | Admit: 2022-04-01 | Discharge: 2022-04-01 | Disposition: A | Discharge status: Home / Self Care | Payer: Medicaid Other | Source: Ambulatory Visit | Attending: Family Medicine | Admitting: Family Medicine

## 2022-04-01 LAB — BASIC METABOLIC PANEL
BUN/Creatinine Ratio: 15 (ref 9–23)
BUN: 10 mg/dL (ref 6–20)
CO2: 17 mmol/L — ABNORMAL LOW (ref 20–29)
Calcium: 9.9 mg/dL (ref 8.7–10.2)
Chloride: 106 mmol/L (ref 96–106)
Creatinine, Ser: 0.67 mg/dL (ref 0.57–1.00)
Glucose: 79 mg/dL (ref 70–99)
Potassium: 3.9 mmol/L (ref 3.5–5.2)
Sodium: 143 mmol/L (ref 134–144)
eGFR: 124 mL/min/{1.73_m2} (ref >59)

## 2022-04-07 ENCOUNTER — Ambulatory Visit (HOSPITAL_BASED_OUTPATIENT_CLINIC_OR_DEPARTMENT_OTHER): Payer: Medicaid Other | Attending: Family Medicine

## 2022-04-07 ENCOUNTER — Encounter (HOSPITAL_BASED_OUTPATIENT_CLINIC_OR_DEPARTMENT_OTHER): Payer: Self-pay

## 2022-04-19 ENCOUNTER — Ambulatory Visit (HOSPITAL_BASED_OUTPATIENT_CLINIC_OR_DEPARTMENT_OTHER): Payer: Medicaid Other

## 2022-04-20 ENCOUNTER — Ambulatory Visit (HOSPITAL_BASED_OUTPATIENT_CLINIC_OR_DEPARTMENT_OTHER): Admission: RE | Admit: 2022-04-20 | Payer: Medicaid Other | Source: Ambulatory Visit

## 2022-04-20 ENCOUNTER — Ambulatory Visit (INDEPENDENT_AMBULATORY_CARE_PROVIDER_SITE_OTHER): Payer: Medicaid Other | Admitting: Family Medicine

## 2022-04-20 ENCOUNTER — Encounter: Payer: Self-pay | Admitting: Family Medicine

## 2022-04-20 NOTE — Progress Notes (Signed)
? ? ?Post Partum Visit Note ? ?Shannon Ruiz is a 26 y.o. G50P2012 female who presents for a postpartum visit. She is 5 weeks postpartum following a normal spontaneous vaginal delivery.  I have fully reviewed the prenatal and intrapartum course. The delivery was at 40 gestational weeks.  Anesthesia: epidural. Postpartum course has been complicated by retained products, improved after medical treatment. Baby is doing well. Baby is feeding by breast. Bleeding no bleeding. Bowel function is normal. Bladder function is normal. Patient is not sexually active. Contraception method is IUD. Postpartum depression screening: negative. ? ? ?The pregnancy intention screening data noted above was reviewed. Potential methods of contraception were discussed. The patient elected to proceed with No data recorded. ? ? Edinburgh Postnatal Depression Scale - 04/20/22 1126   ? ?  ? Edinburgh Postnatal Depression Scale:  In the Past 7 Days  ? I have been able to laugh and see the funny side of things. 0   ? I have looked forward with enjoyment to things. 0   ? I have blamed myself unnecessarily when things went wrong. 0   ? I have been anxious or worried for no good reason. 0   ? I have felt scared or panicky for no good reason. 0   ? Things have been getting on top of me. 1   ? I have been so unhappy that I have had difficulty sleeping. 0   ? I have felt sad or miserable. 0   ? I have been so unhappy that I have been crying. 0   ? The thought of harming myself has occurred to me. 0   ? Edinburgh Postnatal Depression Scale Total 1   ? ?  ?  ? ?  ? ? ?Health Maintenance Due  ?Topic Date Due  ? HPV VACCINES (1 - 2-dose series) Never done  ? COVID-19 Vaccine (2 - Pfizer series) 07/05/2021  ? ? ?The following portions of the patient's history were reviewed and updated as appropriate: allergies, current medications, past family history, past medical history, past social history, past surgical history, and problem list. ? ?Review of  Systems ?Pertinent items are noted in HPI. ? ?Objective:  ?BP 125/80   Pulse 91   Wt 203 lb (92.1 kg)   Breastfeeding Yes   BMI 35.96 kg/m?   ? ?General:  alert, cooperative, and no distress  ? Breasts:  not indicated  ?Lungs: clear to auscultation bilaterally  ?Heart:  regular rate and rhythm, S1, S2 normal, no murmur, click, rub or gallop  ?Abdomen: soft, non-tender; bowel sounds normal; no masses,  no organomegaly   ?Wound N/a  ?GU exam:  not indicated  ?     ?Assessment:  ? ? .1. Postpartum care and examination ? ? ?Plan:  ? ?Essential components of care per ACOG recommendations: ? ?1.  Mood and well being: Patient with negative depression screening today. Reviewed local resources for support.  ?- Patient tobacco use? No.   ?- hx of drug use? No.   ? ?2. Infant care and feeding:  ?-Patient currently breastmilk feeding? Yes. Reviewed importance of draining breast regularly to support lactation.  ?-Social determinants of health (SDOH) reviewed in EPIC. No concerns ? ?3. Sexuality, contraception and birth spacing ?- Patient does not want a pregnancy in the next year.  - Reviewed reproductive life planning. Reviewed contraceptive methods based on pt preferences and effectiveness.  Patient desired IUD or IUS today.   ?- Discussed birth spacing of 18 months ? ?  4. Sleep and fatigue ?-Encouraged family/partner/community support of 4 hrs of uninterrupted sleep to help with mood and fatigue ? ?5. Physical Recovery  ?- Discussed patients delivery and complications. She describes her labor as good. ?- Patient had a Vaginal, no problems at delivery. Patient had  no  laceration. Perineal healing reviewed. Patient expressed understanding ?- Patient has urinary incontinence? No. ?- Patient is safe to resume physical and sexual activity ? ?6.  Health Maintenance ?- HM due items addressed Yes ?- Last pap smear  ?Diagnosis  ?Date Value Ref Range Status  ?06/30/2020   Final  ? - Negative for intraepithelial lesion or malignancy  (NILM)  ? Pap smear not done at today's visit.  ?-Breast Cancer screening indicated? No.  ? ?7. Chronic Disease/Pregnancy Condition follow up: None ? ?- PCP follow up ? ?Levie Heritage, DO ?Center for [employer], [facility]  ?

## 2022-04-26 ENCOUNTER — Ambulatory Visit (HOSPITAL_BASED_OUTPATIENT_CLINIC_OR_DEPARTMENT_OTHER): Admission: RE | Admit: 2022-04-26 | Payer: Medicaid Other | Source: Ambulatory Visit

## 2022-04-26 ENCOUNTER — Ambulatory Visit (HOSPITAL_BASED_OUTPATIENT_CLINIC_OR_DEPARTMENT_OTHER)
Admission: RE | Admit: 2022-04-26 | Discharge: 2022-04-26 | Disposition: A | Discharge status: Home / Self Care | Payer: Medicaid Other | Source: Ambulatory Visit | Attending: Family Medicine | Admitting: Family Medicine

## 2022-05-03 ENCOUNTER — Encounter: Payer: Self-pay | Admitting: Family Medicine

## 2022-05-04 ENCOUNTER — Ambulatory Visit (INDEPENDENT_AMBULATORY_CARE_PROVIDER_SITE_OTHER): Payer: Medicaid Other | Admitting: Family Medicine

## 2022-05-04 ENCOUNTER — Encounter: Payer: Self-pay | Admitting: Family Medicine

## 2022-05-04 VITALS — BP 121/89 | HR 93 | Ht 63.0 in

## 2022-05-04 DIAGNOSIS — Z01812 Encounter for preprocedural laboratory examination: Secondary | ICD-10-CM | POA: Diagnosis not present

## 2022-05-04 DIAGNOSIS — Z30430 Encounter for insertion of intrauterine contraceptive device: Secondary | ICD-10-CM

## 2022-05-04 DIAGNOSIS — Z111 Encounter for screening for respiratory tuberculosis: Secondary | ICD-10-CM

## 2022-05-04 LAB — POCT URINE PREGNANCY: Preg Test, Ur: NEGATIVE

## 2022-05-04 MED ORDER — LEVONORGESTREL 20.1 MCG/DAY IU IUD
1.0000 | INTRAUTERINE_SYSTEM | Freq: Once | INTRAUTERINE | Status: AC
Start: 2022-05-04 — End: 2022-05-04
  Administered 2022-05-04: 1 via INTRAUTERINE

## 2022-05-04 NOTE — Progress Notes (Signed)
Patient needs TB testing for work. Interferon Gold test ordered ?

## 2022-05-04 NOTE — Addendum Note (Signed)
Addended by: Anell Barr on: 05/04/2022 10:55 AM ? ? Modules accepted: Orders ? ?

## 2022-05-04 NOTE — Progress Notes (Signed)
Patient had some blood tinged  discharge. [name] RN  ?

## 2022-05-04 NOTE — Progress Notes (Signed)
IUD Procedure Note Patient identified, informed consent performed, signed copy in chart, time out was performed.  Urine pregnancy test negative.  Speculum placed in the vagina.  Cervix visualized.  Cleaned with Betadine x 2.  Paracervical block placed with Lidocaine 2% with epinephrine 10mL spread between the 12 o'clock, 4 o'clock, 8 o'clock positions. Cervix grasped anteriorly with a single tooth tenaculum.  Uterus sounded to 10 cm.  Liletta  IUD placed per manufacturer's recommendations.  Strings trimmed to 3 cm. Tenaculum was removed, good hemostasis noted.  Patient tolerated procedure well.   Patient given post procedure instructions and Liletta care card with expiration date.  Patient is asked to check IUD strings periodically and follow up in 4-6 weeks for IUD check. 

## 2022-05-09 LAB — QUANTIFERON-TB GOLD PLUS
QuantiFERON Mitogen Value: >10 IU/mL
QuantiFERON Nil Value: 0.04 IU/mL
QuantiFERON TB1 Ag Value: 0.1 IU/mL
QuantiFERON TB2 Ag Value: 0.1 IU/mL
QuantiFERON-TB Gold Plus: NEGATIVE

## 2022-05-11 ENCOUNTER — Encounter: Payer: Self-pay | Admitting: General Practice

## 2022-05-19 ENCOUNTER — Encounter: Payer: Self-pay | Admitting: Family Medicine

## 2022-06-02 ENCOUNTER — Ambulatory Visit: Payer: Medicaid Other | Admitting: Family Medicine

## 2022-06-20 ENCOUNTER — Ambulatory Visit: Payer: Medicaid Other | Admitting: Obstetrics & Gynecology

## 2022-07-13 ENCOUNTER — Ambulatory Visit: Payer: Medicaid Other | Admitting: Obstetrics & Gynecology

## 2022-07-15 ENCOUNTER — Telehealth: Payer: Self-pay | Admitting: Lactation Services

## 2022-07-15 NOTE — Telephone Encounter (Signed)
-----   Message from Venora Maples, MD sent at 07/14/2022  5:27 PM EDT ----- Regarding: Breastfeeding help! Can you take a look at the patient messages and see if you have any additional advice? Or maybe call her if you're feeling kind?  Thanks! [employer]

## 2022-07-15 NOTE — Telephone Encounter (Signed)
Called patient at request of Dr. Crissie Reese for breast feeding questions. Patient did not answer. LM for her to call the office at [phone] to leave a message for Lactation with any questions or concerns or to schedule an appointment with Lactation. Will send Mychart message.

## 2022-07-16 ENCOUNTER — Encounter (HOSPITAL_BASED_OUTPATIENT_CLINIC_OR_DEPARTMENT_OTHER): Payer: Self-pay | Admitting: Emergency Medicine

## 2022-07-16 ENCOUNTER — Other Ambulatory Visit: Payer: Self-pay

## 2022-07-16 ENCOUNTER — Emergency Department (HOSPITAL_BASED_OUTPATIENT_CLINIC_OR_DEPARTMENT_OTHER)
Admission: EM | Admit: 2022-07-16 | Discharge: 2022-07-16 | Disposition: A | Discharge status: Home / Self Care | Payer: Medicaid Other | Attending: Emergency Medicine | Admitting: Emergency Medicine

## 2022-07-16 DIAGNOSIS — L02416 Cutaneous abscess of left lower limb: Secondary | ICD-10-CM | POA: Diagnosis present

## 2022-07-16 DIAGNOSIS — L0291 Cutaneous abscess, unspecified: Secondary | ICD-10-CM

## 2022-07-16 MED ORDER — SULFAMETHOXAZOLE-TRIMETHOPRIM 800-160 MG PO TABS: 1.0000 | ORAL_TABLET | Freq: Two times a day (BID) | ORAL | 0 refills | Status: AC

## 2022-07-16 MED ORDER — LIDOCAINE-EPINEPHRINE (PF) 2 %-1:200000 IJ SOLN
10.0000 mL | Freq: Once | INTRAMUSCULAR | Status: AC
  Administered 2022-07-16: 10 mL
  Filled 2022-07-16: qty 20

## 2022-07-16 NOTE — ED Triage Notes (Signed)
Pt reports abscess on thigh near groin

## 2022-07-16 NOTE — ED Provider Notes (Signed)
[facility] EMERGENCY DEPARTMENT Provider Note   CSN: [id number] Arrival date & time: 07/16/22  1135     History  Chief Complaint  Patient presents with   Abscess    Shannon Ruiz is a 26 y.o. female who presents to the emergency department with concerns for abscess to the left upper thigh onset 3 days.  Notes history of similar symptoms.  Has tried warm compress at home without relief of her symptoms.  She notes that she has had to have the areas lanced before.  Denies fever, redness, drainage.  Denies allergies to medications.   The history is provided by the patient. No language interpreter was used.       Home Medications Prior to Admission medications   Medication Sig Start Date End Date Taking? Authorizing Provider  acetaminophen (TYLENOL) 500 MG tablet Take 2 tablets (1,000 mg total) by mouth every 8 (eight) hours as needed (pain). Patient not taking: Reported on 03/31/2022 03/16/22   Worthy Rancher, MD  ibuprofen (ADVIL) 600 MG tablet Take 1 tablet (600 mg total) by mouth every 6 (six) hours as needed (pain). Patient not taking: Reported on 03/31/2022 03/16/22   Worthy Rancher, MD  NIFEdipine (ADALAT CC) 60 MG 24 hr tablet Take 1 tablet (60 mg total) by mouth daily. Patient not taking: Reported on 04/20/2022 03/16/22   Worthy Rancher, MD      Allergies    Patient has no known allergies.    Review of Systems   Review of Systems  Constitutional:  Negative for fever.  Skin:  Negative for color change and wound.       Abscess noted to left upper thigh.  All other systems reviewed and are negative.   Physical Exam Updated Vital Signs BP (!) 123/93 (BP Location: Left Arm)   Pulse (!) 124   Temp 98.7 F (37.1 C) (Oral)   Resp 17   Ht (!) 5.3" (0.135 m)   Wt 89.8 kg   SpO2 96%   Breastfeeding Yes   BMI 4955.83 kg/m  Physical Exam Vitals and nursing note reviewed.  Constitutional:      General: She is not in acute distress.    Appearance: She  is not diaphoretic.  HENT:     Head: Normocephalic and atraumatic.     Mouth/Throat:     Pharynx: No oropharyngeal exudate.  Eyes:     General: No scleral icterus.    Conjunctiva/sclera: Conjunctivae normal.  Cardiovascular:     Rate and Rhythm: Normal rate and regular rhythm.     Pulses: Normal pulses.     Heart sounds: Normal heart sounds.  Pulmonary:     Effort: Pulmonary effort is normal. No respiratory distress.     Breath sounds: Normal breath sounds. No wheezing.  Abdominal:     Palpations: Abdomen is soft.  Genitourinary:    Comments: RN chaperone present for exam. 1.5 cm area of fluctuance noted to right medial left thigh near inguinal fold. No overlying erythema. Induration noted surrounding. TTP noted to the area.  Musculoskeletal:        General: Normal range of motion.     Cervical back: Normal range of motion and neck supple.  Skin:    General: Skin is warm and dry.  Neurological:     Mental Status: She is alert.  Psychiatric:        Behavior: Behavior normal.     ED Results / Procedures / Treatments   Labs (all  labs ordered are listed, but only abnormal results are displayed) Labs Reviewed - No data to display  EKG None  Radiology No results found.  Procedures .Marland KitchenIncision and Drainage  Date/Time: 07/16/2022 1:17 PM  Performed by: Cyndee Brightly, Feliz Lincoln A, PA-C Authorized by: Karenann Cai, PA-C   Consent:    Consent obtained:  Verbal   Consent given by:  Patient   Risks discussed:  Bleeding, incomplete drainage and pain Universal protocol:    Patient identity confirmed:  Verbally with patient Location:    Type:  Abscess   Size:  1.5   Location:  Lower extremity   Lower extremity location:  Leg   Leg location:  L upper leg Sedation:    Sedation type:  None Anesthesia:    Anesthesia method:  Local infiltration   Local anesthetic:  Lidocaine 2% WITH epi (2 ml) Procedure type:    Complexity:  Simple Procedure details:    Incision types:  Single  straight   Wound management:  Probed and deloculated and irrigated with saline   Drainage:  Bloody and purulent   Drainage amount:  Moderate   Packing materials:  1/4 in iodoform gauze   Amount 1/4" iodoform:  2 inches Post-procedure details:    Procedure completion:  Tolerated well, no immediate complications     Medications Ordered in ED Medications  lidocaine-EPINEPHrine (XYLOCAINE W/EPI) 2 %-1:200000 (PF) injection 10 mL (10 mLs Infiltration Given by Other 07/16/22 1211)    ED Course/ Medical Decision Making/ A&P Clinical Course as of 07/16/22 1316  Sat Jul 16, 2022  1314 Discussed with ED pharamcist regarding antibiotic therapy for patient. ED pharmacist noted Bactrim as best option in a breast feeding patient. Will prescribe.  [SB]    Clinical Course User Index [SB] Randie Tallarico A, PA-C                           Medical Decision Making Risk Prescription drug management.   Pt presents with abscess to her left thigh onset 3 days. Reports history of similar symptoms that she has had to have I&D. Vital signs, stable, pt afebrile. On exam, pt with RN chaperone present for exam. 1.5 cm area of fluctuance noted to right medial left thigh near inguinal fold. No overlying erythema. Induration noted surrounding. TTP noted to the area. No acute cardiovascular, respiratory, exam findings. Differential diagnosis includes cellulitis, abscess.   Disposition: Presentation suspicious for abscess. Doubt cellulitis at this time. After consideration of the diagnostic results and the patients response to treatment, I feel that the patient would benefit from Discharge home. Will send patient home with a prescription of bactrim.  Patient informed to remove packing in 2 days.  Also discussed with patient supportive care measures and strict return precautions discussed with patient at bedside. Pt acknowledges and verbalizes understanding. Pt appears safe for discharge. Follow up as indicated in  discharge paperwork.    This chart was dictated using voice recognition software, Dragon. Despite the best efforts of this provider to proofread and correct errors, errors may still occur which can change documentation meaning.   Final Clinical Impression(s) / ED Diagnoses Final diagnoses:  Abscess    Rx / DC Orders ED Discharge Orders          Ordered    sulfamethoxazole-trimethoprim (BACTRIM DS) 800-160 MG tablet  2 times daily        07/16/22 1313  Kyrene Longan A, PA-C 07/16/22 1319    Ernie Avena, MD 07/16/22 1454

## 2022-07-16 NOTE — ED Notes (Signed)
Pt discharged to home. Discharge instructions have been discussed with patient and/or family members. Pt verbally acknowledges understanding d/c instructions, and endorses comprehension to checkout at registration before leaving.  °

## 2022-07-16 NOTE — Discharge Instructions (Addendum)
It was a pleasure taking care of you today!   Your abscess was drained today in the ED. Packing was placed to the area, it is important that you remove the packing within 2 days. Keep the area clean and dry. You may follow up with your primary care provider as needed. If you do not have a primary care provider, you may follow up with the [facility] Department regarding todays ED visit. Return to the ED if you are experiencing increasing/worsening drainage, redness streaking, fever, or worsening symptoms.

## 2022-07-18 ENCOUNTER — Encounter: Payer: Self-pay | Admitting: Lactation Services

## 2022-07-18 NOTE — Telephone Encounter (Signed)
Called patient to discuss Lactation concerns. She did not answer. LM for her to call the office at her convenience. Will send My Chart message.

## 2022-08-16 ENCOUNTER — Encounter: Payer: Self-pay | Admitting: Family Medicine

## 2022-08-18 ENCOUNTER — Encounter: Payer: Self-pay | Admitting: Family Medicine

## 2022-08-18 ENCOUNTER — Ambulatory Visit (INDEPENDENT_AMBULATORY_CARE_PROVIDER_SITE_OTHER): Payer: Medicaid Other | Admitting: Family Medicine

## 2022-08-18 VITALS — BP 117/64 | HR 90 | Wt 211.0 lb

## 2022-08-18 DIAGNOSIS — Z30432 Encounter for removal of intrauterine contraceptive device: Secondary | ICD-10-CM

## 2022-08-18 MED ORDER — PHEXXI 1.8-1-0.4 % VA GEL: 1.0000 | VAGINAL | 11 refills | Status: DC

## 2022-08-18 NOTE — Progress Notes (Signed)
Patient having difficulty with IUD.  Patient partner states that he is feeling the IUD.  Additionally, she is having daily headaches and mood changes.  Blood pressure is normal.  Would like to use Phexxi for birth control.  We discussed how to use Phexxi  IUD Removal  Patient was in the dorsal lithotomy position, normal external genitalia was noted.  A speculum was placed in the patient's vagina, normal discharge was noted, no lesions. The multiparous cervix was visualized, no lesions, no abnormal discharge,  and was swabbed with Betadine using scopettes.  The strings of the IUD was grasped and pulled using ring forceps.  The IUD was successfully removed in its entirety.  Patient tolerated the procedure well.

## 2022-10-20 ENCOUNTER — Encounter: Payer: Self-pay | Admitting: Family Medicine

## 2022-10-20 MED ORDER — AMOXICILLIN 875 MG PO TABS
875.0000 mg | ORAL_TABLET | Freq: Two times a day (BID) | ORAL | 0 refills | Status: DC
Start: 2022-10-20 — End: 2023-01-25

## 2022-12-14 ENCOUNTER — Inpatient Hospital Stay (HOSPITAL_COMMUNITY)
Admission: AD | Admit: 2022-12-14 | Discharge: 2022-12-14 | Disposition: A | Discharge status: Home / Self Care | Payer: Medicaid Other | Attending: Obstetrics and Gynecology | Admitting: Obstetrics and Gynecology

## 2022-12-14 ENCOUNTER — Inpatient Hospital Stay (HOSPITAL_COMMUNITY): Payer: Medicaid Other

## 2022-12-14 ENCOUNTER — Encounter (HOSPITAL_COMMUNITY): Payer: Self-pay

## 2022-12-14 DIAGNOSIS — O208 Other hemorrhage in early pregnancy: Secondary | ICD-10-CM | POA: Diagnosis present

## 2022-12-14 DIAGNOSIS — N939 Abnormal uterine and vaginal bleeding, unspecified: Secondary | ICD-10-CM

## 2022-12-14 DIAGNOSIS — O418X10 Other specified disorders of amniotic fluid and membranes, first trimester, not applicable or unspecified: Secondary | ICD-10-CM | POA: Diagnosis not present

## 2022-12-14 DIAGNOSIS — Z3A01 Less than 8 weeks gestation of pregnancy: Secondary | ICD-10-CM | POA: Diagnosis not present

## 2022-12-14 DIAGNOSIS — O26891 Other specified pregnancy related conditions, first trimester: Secondary | ICD-10-CM | POA: Insufficient documentation

## 2022-12-14 DIAGNOSIS — O468X1 Other antepartum hemorrhage, first trimester: Secondary | ICD-10-CM

## 2022-12-14 DIAGNOSIS — O3411 Maternal care for benign tumor of corpus uteri, first trimester: Secondary | ICD-10-CM | POA: Diagnosis not present

## 2022-12-14 LAB — CBC
HCT: 38.8 % (ref 36.0–46.0)
Hemoglobin: 12.7 g/dL (ref 12.0–15.0)
MCH: 27.7 pg (ref 26.0–34.0)
MCHC: 32.7 g/dL (ref 30.0–36.0)
MCV: 84.5 fL (ref 80.0–100.0)
Platelets: 562 10*3/uL — ABNORMAL HIGH (ref 150–400)
RBC: 4.59 MIL/uL (ref 3.87–5.11)
RDW: 14.6 % (ref 11.5–15.5)
WBC: 17.6 10*3/uL — ABNORMAL HIGH (ref 4.0–10.5)
nRBC: 0 % (ref 0.0–0.2)

## 2022-12-14 LAB — URINALYSIS, ROUTINE W REFLEX MICROSCOPIC
Bilirubin Urine: NEGATIVE
Glucose, UA: NEGATIVE mg/dL
Hgb urine dipstick: NEGATIVE
Ketones, ur: NEGATIVE mg/dL
Leukocytes,Ua: NEGATIVE
Nitrite: NEGATIVE
Protein, ur: NEGATIVE mg/dL
Specific Gravity, Urine: 1.016 (ref 1.005–1.030)
pH: 6 (ref 5.0–8.0)

## 2022-12-14 LAB — ABO/RH: ABO/RH(D): A POS

## 2022-12-14 LAB — WET PREP, GENITAL
Clue Cells Wet Prep HPF POC: NONE SEEN
Sperm: NONE SEEN
Trich, Wet Prep: NONE SEEN
WBC, Wet Prep HPF POC: <10 (ref <10)
Yeast Wet Prep HPF POC: NONE SEEN

## 2022-12-14 LAB — POCT PREGNANCY, URINE: Preg Test, Ur: POSITIVE — AB

## 2022-12-14 LAB — HCG, QUANTITATIVE, PREGNANCY: hCG, Beta Chain, Quant, S: 9474 m[IU]/mL — ABNORMAL HIGH (ref <5)

## 2022-12-14 NOTE — MAU Provider Note (Signed)
History     CSN: [id number]  Arrival date and time: 12/14/22 1950   Event Date/Time   First Provider Initiated Contact with Patient 12/14/22 2259      Chief Complaint  Patient presents with   Abdominal Pain   Vaginal Bleeding   Shannon Ruiz , a  26 y.o. [id number] at [redacted]w[redacted]d presents to MAU with complaints of vaginal bleeding and cramping. Patient reports that vaginal bleeding started today, but abdominal pain has been on-going. Patient describes bleeding as bright red, but only with wiping. She denies having to wear a pad and denies passing clots. She denies abnormal vaginal discharge and denies urinary symptoms. She states abdominal pain is like period cramping and waxes and wanes. She is currently rating pain a 4/10. Denies worsening or alleviating symptoms and denies attempting to  relieve pain.    D/t patient acuity in MAU, orders placed from triage.     OB History     Gravida  4   Para  2   Term  2   Preterm      AB  1   Living  2      SAB  1   IAB      Ectopic      Multiple  0   Live Births  2           Past Medical History:  Diagnosis Date   Anemia    Anxiety    Depression     Past Surgical History:  Procedure Laterality Date   NO PAST SURGERIES      Family History  Problem Relation Age of Onset   Anxiety disorder Mother    Hypertension Mother    Heart Problems Mother    Diabetes Father     Social History   Tobacco Use   Smoking status: Former    Types: Cigarettes   Smokeless tobacco: Never  Vaping Use   Vaping Use: Every day  Substance Use Topics   Alcohol use: Never   Drug use: Never    Allergies: No Known Allergies  Medications Prior to Admission  Medication Sig Dispense Refill Last Dose   acetaminophen (TYLENOL) 500 MG tablet Take 2 tablets (1,000 mg total) by mouth every 8 (eight) hours as needed (pain). (Patient not taking: Reported on 03/31/2022) 60 tablet 0    amoxicillin (AMOXIL) 875 MG tablet Take 1 tablet (875 mg  total) by mouth 2 (two) times daily. 14 tablet 0    ibuprofen (ADVIL) 600 MG tablet Take 1 tablet (600 mg total) by mouth every 6 (six) hours as needed (pain). (Patient not taking: Reported on 03/31/2022) 40 tablet 0    Lactic Ac-Citric Ac-Pot Bitart (PHEXXI) 1.8-1-0.4 % GEL Place 1 Application vaginally as directed. 5 g 11    NIFEdipine (ADALAT CC) 60 MG 24 hr tablet Take 1 tablet (60 mg total) by mouth daily. (Patient not taking: Reported on 04/20/2022) 30 tablet 0     Review of Systems  Constitutional:  Negative for chills, fatigue and fever.  Eyes:  Negative for pain and visual disturbance.  Respiratory:  Negative for apnea, shortness of breath and wheezing.   Cardiovascular:  Negative for chest pain and palpitations.  Gastrointestinal:  Positive for abdominal pain. Negative for constipation, diarrhea, nausea and vomiting.  Genitourinary:  Positive for vaginal bleeding. Negative for difficulty urinating, dysuria, pelvic pain, vaginal discharge and vaginal pain.  Musculoskeletal:  Negative for back pain.  Neurological:  Negative for seizures, weakness and headaches.  Psychiatric/Behavioral:  Negative for suicidal ideas.    Physical Exam   Blood pressure 134/88, pulse (!) 113, temperature 98.8 F (37.1 C), temperature source Oral, resp. rate 17, height 5\' 3"  (1.6 m), weight 101.3 kg, last menstrual period 11/04/2022, SpO2 100 %, currently breastfeeding.  Physical Exam Vitals and nursing note reviewed.  Constitutional:      General: She is not in acute distress.    Appearance: Normal appearance.  HENT:     Head: Normocephalic.  Pulmonary:     Effort: Pulmonary effort is normal.  Musculoskeletal:     Cervical back: Normal range of motion.  Skin:    General: Skin is warm and dry.  Neurological:     Mental Status: She is alert and oriented to person, place, and time.  Psychiatric:        Mood and Affect: Mood normal.     MAU Course  Procedures Orders Placed This Encounter   Procedures   Wet prep, genital   13/09/2022 OB LESS THAN 14 WEEKS WITH OB TRANSVAGINAL   CBC   hCG, quantitative, pregnancy   Urinalysis, Routine w reflex microscopic Urine, Clean Catch   Diet NPO time specified   Pregnancy, urine POC   ABO/Rh   Discharge patient   Results for orders placed or performed during the hospital encounter of 12/14/22 (from the past 24 hour(s))  Pregnancy, urine POC     Status: Abnormal   Collection Time: 12/14/22  8:59 PM  Result Value Ref Range   Preg Test, Ur POSITIVE (A) NEGATIVE  CBC     Status: Abnormal   Collection Time: 12/14/22  9:08 PM  Result Value Ref Range   WBC 17.6 (H) 4.0 - 10.5 K/uL   RBC 4.59 3.87 - 5.11 MIL/uL   Hemoglobin 12.7 12.0 - 15.0 g/dL   HCT 12/16/22 52.8 - 41.3 %   MCV 84.5 80.0 - 100.0 fL   MCH 27.7 26.0 - 34.0 pg   MCHC 32.7 30.0 - 36.0 g/dL   RDW 24.4 01.0 - 27.2 %   Platelets 562 (H) 150 - 400 K/uL   nRBC 0.0 0.0 - 0.2 %  ABO/Rh     Status: None   Collection Time: 12/14/22  9:08 PM  Result Value Ref Range   ABO/RH(D) A POS    No rh immune globuloin      NOT A RH IMMUNE GLOBULIN CANDIDATE, PT RH POSITIVE Performed at [facility], [address] [place], [place]   hCG, quantitative, pregnancy     Status: Abnormal   Collection Time: 12/14/22  9:08 PM  Result Value Ref Range   hCG, Beta Chain, Quant, S 9,474 (H) <5 mIU/mL  Urinalysis, Routine w reflex microscopic Urine, Clean Catch     Status: Abnormal   Collection Time: 12/14/22  9:12 PM  Result Value Ref Range   Color, Urine YELLOW YELLOW   APPearance CLOUDY (A) CLEAR   Specific Gravity, Urine 1.016 1.005 - 1.030   pH 6.0 5.0 - 8.0   Glucose, UA NEGATIVE NEGATIVE mg/dL   Hgb urine dipstick NEGATIVE NEGATIVE   Bilirubin Urine NEGATIVE NEGATIVE   Ketones, ur NEGATIVE NEGATIVE mg/dL   Protein, ur NEGATIVE NEGATIVE mg/dL   Nitrite NEGATIVE NEGATIVE   Leukocytes,Ua NEGATIVE NEGATIVE  Wet prep, genital     Status: None   Collection Time: 12/14/22  9:33  PM  Result Value Ref Range   Yeast Wet Prep HPF POC NONE SEEN NONE SEEN  Trich, Wet Prep NONE SEEN NONE SEEN   Clue Cells Wet Prep HPF POC NONE SEEN NONE SEEN   WBC, Wet Prep HPF POC <10 <10   Sperm NONE SEEN    US OB LESS THAN 14 WEEKS WITH OB TRANSVAGINAL  Result Date: 12/14/2022 CLINICAL DATA:  Abdominal cramping and vaginal bleeding. EXAM: OBSTETRIC <14 WK [place] AND TRANSVAGINAL OB US TECHNIQUE: Both transabdominal and transvaginal ultrasound examinations were performed for complete evaluation of the gestation as well as the maternal uterus, adnexal regions, and pelvic cul-de-sac. Transvaginal technique was performed to assess early pregnancy. COMPARISON:  None Available. FINDINGS: Intrauterine gestational sac: Single Yolk sac:  Visualized. Embryo:  Not Visualized. Cardiac Activity: Not Visualized. MSD: 8.9 mm   5 w   5 d Subchorionic hemorrhage: There is a moderate amount of subchorionic hemorrhage inferior to the gestational sac. Maternal uterus/adnexae: Bilateral ovaries are visualized and appear within normal limits. There is trace free fluid in the pelvis. Other: There is a inferior left intramural fibroid measuring 2.4 x 1.4 x 2.4 cm. There is an anterior body intramural fibroid measuring 4.4 x 2.6 x 4.2 cm. IMPRESSION: 1. Single intrauterine gestational sac containing yolk sac only corresponding to gestational age of [redacted] weeks and 5 days. Recommend short-term follow-up ultrasound and correlation with beta HCG to confirm viability. 2. Moderate-sized subchorionic hemorrhage. 3. Uterine fibroids. Electronically Signed   By: Darliss Cheney M.D.   On: 12/14/2022 22:20    Lab results reviewed and interpreted by me.   MDM Wet prep normal UA cloudy, otherwise normal  HCG >9000 WBC mildly elevated, but appears to be normal for patient given the last year of WBC results.  [place] reveled a gestational sac and yolk sac consistent with [redacted]w[redacted]d of pregnancy. Recommendation to set up prenatal care with OB given  todays results.  Also noted a significant subchorionic hematoma likely cause of vaginal bleeding.  Plan for discharge   Assessment and Plan   1. Vaginal bleeding   2. [redacted] weeks gestation of pregnancy   3. Subchorionic hematoma in first trimester, single or unspecified fetus    - Reviewed that vaginal bleeding likely caused by Subchorionic hematoma. Otherwise normal IUP. Prenatal list provided.  - Discussed worsening signs and strict bleeding precautions.  - Early pregnancy precautions reviewed.  - Patient discharged home in stable condition and may return to MAU as needed.   Claudette Head, MSN CNM  12/14/2022, 10:59 PM

## 2022-12-14 NOTE — MAU Note (Signed)
..  Shannon Ruiz is a 26 y.o. at Unknown here in MAU reporting: Vaginal bleeding and abdominal cramping. The bleeding that began this morning, reports it is more than spotting but does not require a pad. Abdominal cramping that is more than normal for the past week. Has not taken any medication.  Had a positive pregnancy test at home LMP: 11/04/2022  Pain score: 4/10 Vitals:   12/14/22 2043  BP: 134/88  Pulse: (!) 113  Resp: 17  Temp: 98.8 F (37.1 C)  SpO2: 100%      Lab orders placed from triage:  preg test

## 2022-12-14 NOTE — Discharge Instructions (Signed)
Prenatal Care Providers           [facility]  [address] [phone]  [facility]   [address]  [phone]  [facility] [facility]       [address] [phone]            [facility] @ [place]     [address] [phone]          [facility] [place]   [address] [phone]  [facility]  [address] [phone]     [facility]  [address]   [phone]     [facility]  Phone: [phone]  [facility]  Phone: [phone]  [facility] Phone: [phone]  [facility] Phone: [phone]  [facility] Phone: [phone]  [facility] Phone: [phone]  [facility]  Phone: [phone] Safe Medications in Pregnancy   Acne: Benzoyl Peroxide Salicylic Acid  Backache/Headache: Tylenol: 2 regular strength every 4 hours OR              2 Extra strength every 6 hours  Colds/Coughs/Allergies: Benadryl (alcohol free) 25 mg every 6 hours as needed Breath right strips Claritin Cepacol throat lozenges Chloraseptic throat spray Cold-Eeze- up to three times per day Cough drops, alcohol free Flonase (by prescription only) Guaifenesin Mucinex Robitussin DM (plain only, alcohol free) Saline nasal spray/drops Sudafed (pseudoephedrine) & Actifed ** use only after [redacted] weeks gestation and if you do not have high blood pressure Tylenol Vicks Vaporub Zinc lozenges Zyrtec   Constipation: Colace Ducolax suppositories Fleet enema Glycerin suppositories Metamucil Milk of magnesia Miralax Senokot Smooth move tea  Diarrhea: Kaopectate Imodium A-D  *NO pepto  Bismol  Hemorrhoids: Anusol Anusol HC Preparation H Tucks  Indigestion: Tums Maalox Mylanta Zantac  Pepcid  Insomnia: Benadryl (alcohol free) 25mg every 6 hours as needed Tylenol PM Unisom, no Gelcaps  Leg Cramps: Tums MagGel  Nausea/Vomiting:  Bonine Dramamine Emetrol Ginger extract Sea bands Meclizine  Nausea medication to take during pregnancy:  Unisom (doxylamine succinate 25 mg tablets) Take one tablet daily at bedtime. If symptoms are not adequately controlled, the dose can be increased to a maximum recommended dose of two tablets daily (1/2 tablet in the morning, 1/2 tablet mid-afternoon and one at bedtime). Vitamin B6 100mg tablets. Take one tablet twice a day (up to 200 mg per day).  Skin Rashes: Aveeno products Benadryl cream or 25mg every 6 hours as needed Calamine Lotion 1% cortisone cream  Yeast infection: Gyne-lotrimin 7 Monistat 7   **If taking multiple medications, please check labels to avoid duplicating the same active ingredients **take medication as directed on the label ** Do not exceed 4000 mg of tylenol in 24 hours **Do not take medications that contain aspirin or ibuprofen    

## 2022-12-15 LAB — GC/CHLAMYDIA PROBE AMP (~~LOC~~) NOT AT ARMC
Chlamydia: NEGATIVE
Comment: NEGATIVE
Comment: NORMAL
Neisseria Gonorrhea: NEGATIVE

## 2022-12-26 NOTE — L&D Delivery Note (Signed)
OB/GYN Faculty Practice Delivery Note  Shannon Ruiz is a 27 y.o. [id number] s/p VD at [redacted]w[redacted]d. She was admitted for IOL gHTN.   ROM: 0h 70m with clear fluid GBS Status:  Negative/-- (07/25 1519) Maximum Maternal Temperature: 98.9 F  Labor Progress: Initial SVE: 3/50/-2. She then progressed to complete.   Delivery Date/Time: 08/04/2023 at 0023 Delivery: Called to room and patient was complete and pushing. Head delivered LOA. No nuchal cord present. Shoulder and body delivered in usual fashion. Infant with spontaneous cry, placed on mother's abdomen, dried and stimulated. Cord clamped x 2 after 1-minute delay, and cut by FOB. Cord blood drawn. Placenta delivered spontaneously with gentle cord traction. Fundus firm with massage and Pitocin. Labia, perineum, vagina, and cervix inspected with small periurethral laceration, hemostatic.  Baby Weight: pending  Placenta: 3 vessel, intact. Sent to L&D Complications: None Lacerations: As above EBL: 103 mL Analgesia: Epidural   Infant:  APGAR (1 MIN): 8  APGAR (5 MINS): 9   Myrtie Hawk, DO OB Family Medicine Fellow, [facility] for [employer], [facility] 08/04/2023, 1:35 AM

## 2023-01-17 ENCOUNTER — Encounter: Payer: Self-pay | Admitting: Family Medicine

## 2023-01-20 MED ORDER — ONDANSETRON 4 MG PO TBDP: 4.0000 mg | ORAL_TABLET | Freq: Four times a day (QID) | ORAL | 3 refills | Status: DC | PRN

## 2023-01-20 NOTE — Addendum Note (Signed)
Addended by: Truett Mainland on: 01/20/2023 02:17 PM   Modules accepted: Orders

## 2023-01-25 ENCOUNTER — Encounter: Payer: Self-pay | Admitting: Family Medicine

## 2023-01-25 ENCOUNTER — Ambulatory Visit (INDEPENDENT_AMBULATORY_CARE_PROVIDER_SITE_OTHER): Payer: Medicaid Other | Admitting: Family Medicine

## 2023-01-25 ENCOUNTER — Encounter: Payer: Self-pay | Admitting: General Practice

## 2023-01-25 ENCOUNTER — Other Ambulatory Visit (HOSPITAL_COMMUNITY)
Admission: RE | Admit: 2023-01-25 | Discharge: 2023-01-25 | Disposition: A | Discharge status: Home / Self Care | Payer: Medicaid Other | Source: Ambulatory Visit | Attending: Family Medicine | Admitting: Family Medicine

## 2023-01-25 VITALS — BP 117/76 | HR 98 | Wt 226.0 lb

## 2023-01-25 DIAGNOSIS — O09899 Supervision of other high risk pregnancies, unspecified trimester: Secondary | ICD-10-CM | POA: Insufficient documentation

## 2023-01-25 DIAGNOSIS — O99341 Other mental disorders complicating pregnancy, first trimester: Secondary | ICD-10-CM

## 2023-01-25 DIAGNOSIS — Z3A11 11 weeks gestation of pregnancy: Secondary | ICD-10-CM | POA: Diagnosis not present

## 2023-01-25 DIAGNOSIS — O09292 Supervision of pregnancy with other poor reproductive or obstetric history, second trimester: Secondary | ICD-10-CM

## 2023-01-25 DIAGNOSIS — O09291 Supervision of pregnancy with other poor reproductive or obstetric history, first trimester: Secondary | ICD-10-CM | POA: Diagnosis not present

## 2023-01-25 DIAGNOSIS — F419 Anxiety disorder, unspecified: Secondary | ICD-10-CM

## 2023-01-25 DIAGNOSIS — Z8759 Personal history of other complications of pregnancy, childbirth and the puerperium: Secondary | ICD-10-CM | POA: Insufficient documentation

## 2023-01-25 MED ORDER — CLINDAMYCIN PHOS-BENZOYL PEROX 1.2-5 % EX GEL: 1.0000 | Freq: Two times a day (BID) | CUTANEOUS | 3 refills | Status: DC

## 2023-01-25 MED ORDER — CITALOPRAM HYDROBROMIDE 10 MG PO TABS: 10.0000 mg | ORAL_TABLET | Freq: Every day | ORAL | 3 refills | Status: DC

## 2023-01-25 NOTE — Addendum Note (Signed)
Addended by: Wendelyn Breslow L on: 01/25/2023 11:25 AM   Modules accepted: Orders

## 2023-01-25 NOTE — Assessment & Plan Note (Addendum)
  Nursing Staff Provider  Office Location [place] Dating  08/10/2023, by Last Menstrual Period  [facility] Model [ ]  Traditional [ ]  Centering [ ]  Mom-Baby Dyad    Language  English Anatomy US    Flu Vaccine  Declines  Genetic/Carrier Screen  NIPS:    AFP:    Horizon:  TDaP Vaccine    Hgb A1C or  GTT Early  Third trimester   COVID Vaccine    LAB RESULTS   Rhogam  A/Positive/-- (01/31 1038)  Blood Type A/Positive/-- (01/31 1038)   Baby Feeding Plan Breast  Antibody Negative (01/31 1038)  Contraception  Rubella 0.99 (01/31 1038)  Circumcision Yes, if boy RPR Non Reactive (01/31 1038)   Pediatrician  [facility]  HBsAg Negative (01/31 1038)   Support Person Barry(FOB)  HCVAb Non Reactive (01/31 1038)   Prenatal Classes  HIV Non Reactive (01/31 1038)     BTL Consent  GBS Negative/-- (02/23 1134) (For PCN allergy, check sensitivities)   VBAC Consent  Pap Diagnosis  Date Value Ref Range Status  06/30/2020   Final   - Negative for intraepithelial lesion or malignancy (NILM)         DME Rx [ ]  BP cuff [ ]  Weight Scale Waterbirth  [ ]  Class [ ]  Consent [ ]  CNM visit  PHQ9 & GAD7 [  ] new OB [  ] 28 weeks  [  ] 36 weeks Induction  [ ]  Orders Entered [ ] Foley Y/N

## 2023-01-25 NOTE — Progress Notes (Signed)
DATING AND VIABILITY SONOGRAM   Shannon Ruiz is a 27 y.o. year old G74P2012 with LMP Patient's last menstrual period was 11/03/2022. which would correlate to  [redacted]w[redacted]d weeks gestation.  She has regular menstrual cycles.   She is here today for a confirmatory initial sonogram.    GESTATION: [place]     FETAL ACTIVITY:          Heart rate         155 bpm          The fetus is active.   ADNEXA: The ovaries are normal.   uterine fibroids seen.   GESTATIONAL AGE AND  BIOMETRICS:  Gestational criteria: Estimated Date of Delivery: 08/10/23 by LMP now at [redacted]w[redacted]d  Previous Scans:1  CROWN RUMP LENGTH           4.36 cm         11-1 weeks             4.49 cm           11-2 weeks                                                                           AVERAGE EGA(BY THIS SCAN):  11.1 weeks  WORKING EDD( LMP ):  08/10/2023     TECHNICIAN COMMENTS: Patient informed that the ultrasound is considered a limited obstetric ultrasound and is not intended to be a complete ultrasound exam.  Patient also informed that the ultrasound is not being completed with the intent of assessing for fetal or placental anomalies or any pelvic abnormalities. Explained that the purpose of today's ultrasound is to assess for fetal heart rate.  Patient acknowledges the purpose of the exam and the limitations of the study.     Kathrene Alu 01/25/2023 10:01 AM

## 2023-01-25 NOTE — Progress Notes (Signed)
Subjective:  Shannon Ruiz is a P6P9509 [redacted]w[redacted]d by LMP,  being seen today for her first obstetrical visit.  Her obstetrical history is significant for  short interval pregnancy. H/o gHTN with last pregnancy, H/o SGA baby . Had Dunn with last pregnancy. Patient does intend to breast feed. Pregnancy history fully reviewed.  Patient reports nausea. Having severe anxiety.  BP 117/76   Pulse 98   Wt 226 lb (102.5 kg)   LMP 11/03/2022   Breastfeeding Unknown   BMI 40.03 kg/m   HISTORY: OB History  Gravida Para Term Preterm AB Living  5 2 2   1 2   SAB IAB Ectopic Multiple Live Births  1     0 2    # Outcome Date GA Lbr Len/2nd Weight Sex Delivery Anes PTL Lv  5 Current           4 Term 03/14/22 [redacted]w[redacted]d 05:02 / 00:17 7 lb 11.8 oz (3.51 kg) M Vag-Spont EPI  LIV     Birth Comments: wnl  3 Term 01/10/21 [redacted]w[redacted]d 30:25 / 00:14 5 lb 8.5 oz (2.509 kg) F Vag-Spont EPI  LIV  2 SAB 2014          1 Gravida             Past Medical History:  Diagnosis Date   Anemia    Anxiety    Depression    Supervision of other high risk pregnancy, antepartum, unspecified trimester 01/09/2021   Needs PP MMR    Past Surgical History:  Procedure Laterality Date   NO PAST SURGERIES      Family History  Problem Relation Age of Onset   Anxiety disorder Mother    Hypertension Mother    Heart Problems Mother    Diabetes Father      Exam  BP 117/76   Pulse 98   Wt 226 lb (102.5 kg)   LMP 11/03/2022   Breastfeeding Unknown   BMI 40.03 kg/m   Chaperone present during exam  CONSTITUTIONAL: Well-developed, well-nourished female in no acute distress.  HENT:  Normocephalic, atraumatic, External right and left ear normal. Oropharynx is clear and moist EYES: Conjunctivae and EOM are normal. Pupils are equal, round, and reactive to light. No scleral icterus.  NECK: Normal range of motion, supple, no masses.  Normal thyroid.  CARDIOVASCULAR: Normal heart rate noted, regular rhythm RESPIRATORY: Clear to  auscultation bilaterally. Effort and breath sounds normal, no problems with respiration noted. BREASTS: Symmetric in size. No masses, skin changes, nipple drainage, or lymphadenopathy. Multiple scars from small abscess on underside of breast ABDOMEN: Soft, normal bowel sounds, no distention noted.  No tenderness, rebound or guarding.  PELVIC: Normal appearing external genitalia; normal appearing vaginal mucosa and cervix. No abnormal discharge noted. Normal uterine size, no other palpable masses, no uterine or adnexal tenderness. MUSCULOSKELETAL: Normal range of motion. No tenderness.  No cyanosis, clubbing, or edema.  2+ distal pulses. SKIN: Skin is warm and dry. No rash noted. Not diaphoretic. No erythema. No pallor. NEUROLOGIC: Alert and oriented to person, place, and time. Normal reflexes, muscle tone coordination. No cranial nerve deficit noted. PSYCHIATRIC: Normal mood and affect. Normal behavior. Normal judgment and thought content.    Assessment:    Pregnancy: [id number] Patient Active Problem List   Diagnosis Date Noted   Short interval between pregnancies affecting pregnancy, antepartum 01/25/2023   Vaginal delivery 03/16/2022   Encounter for induction of labor 03/14/2022   Gestational hypertension 03/14/2022   Hypokalemia 03/14/2022  History of IUGR (intrauterine growth retardation) and stillbirth, currently pregnant, second trimester 10/27/2021   Obesity in pregnancy, antepartum 09/03/2021   Supervision of other high risk pregnancy, antepartum, unspecified trimester 01/09/2021   Cigarette smoker 06/30/2020   Anxiety disorder affecting pregnancy, antepartum 06/30/2020      Plan:   1. Supervision of other high risk pregnancy, antepartum   - [place] MFM OB DETAIL +14 WK; Future - Culture, OB Urine - CBC/D/Plt+RPR+Rh+ABO+RubIgG... - Hemoglobin A1c - Cytology - PAP( [place]) - [facility] AMB BABYSCRIPTS OPT IN - Panorama Prenatal Test Full Panel - Comprehensive metabolic panel -  Protein / creatinine ratio, urine  2. Supervision of other high risk pregnancy, antepartum, unspecified trimester  3. History of gestational hypertension ASA 81mg   4. Short interval between pregnancies affecting pregnancy, antepartum  5. Anxiety disorder affecting pregnancy, antepartum Will start celexa 10mg . If tolerating, increase to 20mg  in 2 weeks.  6. History of IUGR (intrauterine growth retardation) and stillbirth, currently pregnant, second trimester    Initial labs obtained Continue prenatal vitamins Reviewed n/v relief measures and warning s/s to report Reviewed recommended weight gain based on pre-gravid BMI Encouraged well-balanced diet Genetic & carrier screening discussed: requests Panorama,  Ultrasound discussed; fetal survey: requested [place] completed> form faxed if has or is planning to apply for medicaid The nature of [place] for [employer] with multiple MDs and other Advanced Practice Providers was explained to patient; also emphasized that fellows, residents, and students are part of our team.  Problem list reviewed and updated. 75% of 30 min visit spent on counseling and coordination of care.     Truett Mainland 01/25/2023

## 2023-01-27 ENCOUNTER — Encounter: Payer: Self-pay | Admitting: Family Medicine

## 2023-01-27 LAB — CBC/D/PLT+RPR+RH+ABO+RUBIGG...
Antibody Screen: NEGATIVE
Basophils Absolute: 0.1 10*3/uL (ref 0.0–0.2)
Basos: 1 %
EOS (ABSOLUTE): 0.2 10*3/uL (ref 0.0–0.4)
Eos: 1 %
HCV Ab: NONREACTIVE
HIV Screen 4th Generation wRfx: NONREACTIVE
Hematocrit: 37.8 % (ref 34.0–46.6)
Hemoglobin: 12.8 g/dL (ref 11.1–15.9)
Hepatitis B Surface Ag: NEGATIVE
Immature Grans (Abs): 0.2 10*3/uL — ABNORMAL HIGH (ref 0.0–0.1)
Immature Granulocytes: 1 %
Lymphocytes Absolute: 2.6 10*3/uL (ref 0.7–3.1)
Lymphs: 21 %
MCH: 27.6 pg (ref 26.6–33.0)
MCHC: 33.9 g/dL (ref 31.5–35.7)
MCV: 82 fL (ref 79–97)
Monocytes Absolute: 0.8 10*3/uL (ref 0.1–0.9)
Monocytes: 7 %
Neutrophils Absolute: 8.2 10*3/uL — ABNORMAL HIGH (ref 1.4–7.0)
Neutrophils: 69 %
Platelets: 496 10*3/uL — ABNORMAL HIGH (ref 150–450)
RBC: 4.64 x10E6/uL (ref 3.77–5.28)
RDW: 13.3 % (ref 11.7–15.4)
RPR Ser Ql: NONREACTIVE
Rh Factor: POSITIVE
Rubella Antibodies, IGG: 0.99 index — ABNORMAL LOW (ref >0.99)
WBC: 12 10*3/uL — ABNORMAL HIGH (ref 3.4–10.8)

## 2023-01-27 LAB — CYTOLOGY - PAP
Chlamydia: NEGATIVE
Comment: NEGATIVE
Comment: NORMAL
Diagnosis: NEGATIVE
Neisseria Gonorrhea: NEGATIVE

## 2023-01-27 LAB — COMPREHENSIVE METABOLIC PANEL
ALT: 7 IU/L (ref 0–32)
AST: 11 IU/L (ref 0–40)
Albumin/Globulin Ratio: 1.4 (ref 1.2–2.2)
Albumin: 4.1 g/dL (ref 4.0–5.0)
Alkaline Phosphatase: 62 IU/L (ref 44–121)
BUN/Creatinine Ratio: 12 (ref 9–23)
BUN: 6 mg/dL (ref 6–20)
Bilirubin Total: <0.2 mg/dL (ref 0.0–1.2)
CO2: 16 mmol/L — ABNORMAL LOW (ref 20–29)
Calcium: 9.7 mg/dL (ref 8.7–10.2)
Chloride: 105 mmol/L (ref 96–106)
Creatinine, Ser: 0.51 mg/dL — ABNORMAL LOW (ref 0.57–1.00)
Globulin, Total: 2.9 g/dL (ref 1.5–4.5)
Glucose: 86 mg/dL (ref 70–99)
Potassium: 3.9 mmol/L (ref 3.5–5.2)
Sodium: 138 mmol/L (ref 134–144)
Total Protein: 7 g/dL (ref 6.0–8.5)
eGFR: 132 mL/min/{1.73_m2} (ref >59)

## 2023-01-27 LAB — HEMOGLOBIN A1C
Est. average glucose Bld gHb Est-mCnc: 108 mg/dL
Hgb A1c MFr Bld: 5.4 % (ref 4.8–5.6)

## 2023-01-27 LAB — HCV INTERPRETATION

## 2023-01-27 LAB — PROTEIN / CREATININE RATIO, URINE
Creatinine, Urine: 102.2 mg/dL
Protein, Ur: 22.3 mg/dL
Protein/Creat Ratio: 218 mg/g creat — ABNORMAL HIGH (ref 0–200)

## 2023-01-27 LAB — URINE CULTURE, OB REFLEX

## 2023-01-27 LAB — CULTURE, OB URINE

## 2023-01-31 ENCOUNTER — Inpatient Hospital Stay (HOSPITAL_COMMUNITY)
Admission: AD | Admit: 2023-01-31 | Discharge: 2023-01-31 | Disposition: A | Discharge status: Home / Self Care | Payer: Medicaid Other | Attending: Obstetrics & Gynecology | Admitting: Obstetrics & Gynecology

## 2023-01-31 ENCOUNTER — Encounter (HOSPITAL_COMMUNITY): Payer: Self-pay | Admitting: Obstetrics & Gynecology

## 2023-01-31 ENCOUNTER — Other Ambulatory Visit: Payer: Self-pay

## 2023-01-31 DIAGNOSIS — Z3A12 12 weeks gestation of pregnancy: Secondary | ICD-10-CM | POA: Insufficient documentation

## 2023-01-31 DIAGNOSIS — O99611 Diseases of the digestive system complicating pregnancy, first trimester: Secondary | ICD-10-CM | POA: Diagnosis not present

## 2023-01-31 DIAGNOSIS — O26891 Other specified pregnancy related conditions, first trimester: Secondary | ICD-10-CM | POA: Diagnosis not present

## 2023-01-31 DIAGNOSIS — N390 Urinary tract infection, site not specified: Secondary | ICD-10-CM | POA: Insufficient documentation

## 2023-01-31 DIAGNOSIS — M940 Chondrocostal junction syndrome [Tietze]: Secondary | ICD-10-CM

## 2023-01-31 DIAGNOSIS — O2340 Unspecified infection of urinary tract in pregnancy, unspecified trimester: Secondary | ICD-10-CM | POA: Insufficient documentation

## 2023-01-31 DIAGNOSIS — O2342 Unspecified infection of urinary tract in pregnancy, second trimester: Secondary | ICD-10-CM | POA: Insufficient documentation

## 2023-01-31 DIAGNOSIS — K219 Gastro-esophageal reflux disease without esophagitis: Secondary | ICD-10-CM | POA: Diagnosis not present

## 2023-01-31 DIAGNOSIS — O09293 Supervision of pregnancy with other poor reproductive or obstetric history, third trimester: Secondary | ICD-10-CM

## 2023-01-31 DIAGNOSIS — O09299 Supervision of pregnancy with other poor reproductive or obstetric history, unspecified trimester: Secondary | ICD-10-CM

## 2023-01-31 LAB — PANORAMA PRENATAL TEST FULL PANEL:PANORAMA TEST PLUS 5 ADDITIONAL MICRODELETIONS: FETAL FRACTION: 2.2

## 2023-01-31 LAB — URINALYSIS, ROUTINE W REFLEX MICROSCOPIC
Bilirubin Urine: NEGATIVE
Glucose, UA: NEGATIVE mg/dL
Hgb urine dipstick: NEGATIVE
Ketones, ur: NEGATIVE mg/dL
Leukocytes,Ua: NEGATIVE
Nitrite: POSITIVE — AB
Protein, ur: NEGATIVE mg/dL
Specific Gravity, Urine: 1.017 (ref 1.005–1.030)
pH: 6 (ref 5.0–8.0)

## 2023-01-31 LAB — WET PREP, GENITAL
Clue Cells Wet Prep HPF POC: NONE SEEN
Sperm: NONE SEEN
Trich, Wet Prep: NONE SEEN
WBC, Wet Prep HPF POC: <10 (ref <10)
Yeast Wet Prep HPF POC: NONE SEEN

## 2023-01-31 MED ORDER — ALUM & MAG HYDROXIDE-SIMETH 200-200-20 MG/5ML PO SUSP
30.0000 mL | Freq: Once | ORAL | Status: AC
  Administered 2023-01-31: 30 mL via ORAL
  Filled 2023-01-31: qty 30

## 2023-01-31 MED ORDER — ACETAMINOPHEN 500 MG PO TABS: 1000.0000 mg | ORAL_TABLET | Freq: Four times a day (QID) | ORAL | 0 refills | Status: AC

## 2023-01-31 MED ORDER — NITROFURANTOIN MONOHYD MACRO 100 MG PO CAPS: 100.0000 mg | ORAL_CAPSULE | Freq: Two times a day (BID) | ORAL | 1 refills | Status: DC

## 2023-01-31 MED ORDER — ACETAMINOPHEN 500 MG PO TABS
1000.0000 mg | ORAL_TABLET | Freq: Once | ORAL | Status: AC
  Administered 2023-01-31: 1000 mg via ORAL
  Filled 2023-01-31: qty 2

## 2023-01-31 MED ORDER — CYCLOBENZAPRINE HCL 10 MG PO TABS: 10.0000 mg | ORAL_TABLET | Freq: Two times a day (BID) | ORAL | 0 refills | Status: DC | PRN

## 2023-01-31 NOTE — MAU Provider Note (Signed)
History    CSN: [id number] Arrival date and time: 01/31/23 1009   Event Date/Time   First Provider Initiated Contact with Patient 01/31/23 1037      Chief Complaint  Patient presents with   Back Pain   Vaginal Discharge   Chest Pain   Patient is 27 y.o. [id number] [redacted]w[redacted]d here with complaints of left sided chest pain for the past several days. She reports acute onset, not associated with SOB, dizziness, not worsened with movement or exertion. Denies warm, swollen calves. Denies recent illness. Denies trauma to area or falls. Reports trying nothing for the pain. She does have a family history of early MI in mother who had a triple bypass in her early 69s.  Chest pain is located under left breast and starts there and then moves around the left side. Also associated upper abdominal tightening with this chest pain. She recently started Celexa about 1 week ago.   +FM, denies LOF, VB, contractions, vaginal discharge.  Back Pain This is a new problem. The current episode started yesterday. The problem occurs 2 to 4 times per day. The problem is unchanged. The pain is present in the thoracic spine. The quality of the pain is described as aching. The pain does not radiate. The pain is at a severity of 2/10. The pain is mild. The symptoms are aggravated by bending and lying down. Pertinent negatives include no abdominal pain, chest pain, dysuria or fever. Risk factors include obesity, pregnancy and lack of exercise. She has tried nothing for the symptoms.  Vaginal Discharge The patient's primary symptoms include vaginal discharge. This is a new problem. The current episode started in the past 7 days. The problem occurs constantly. The problem has been unchanged. The patient is experiencing no pain. Associated symptoms include back pain. Pertinent negatives include no abdominal pain, chills, diarrhea, dysuria, fever, flank pain, nausea, rash, sore throat or vomiting. The vaginal discharge was normal. There has  been no bleeding. She has not been passing clots. She has not been passing tissue. Nothing aggravates the symptoms. She has tried nothing for the symptoms. She is sexually active. No, her partner does not have an STD.    OB History     Gravida  5   Para  2   Term  2   Preterm      AB  1   Living  2      SAB  1   IAB      Ectopic      Multiple  0   Live Births  2           Past Medical History:  Diagnosis Date   Anemia    Anxiety    Depression    Supervision of other high risk pregnancy, antepartum, unspecified trimester 01/09/2021   Needs PP MMR    Past Surgical History:  Procedure Laterality Date   NO PAST SURGERIES      Family History  Problem Relation Age of Onset   Anxiety disorder Mother    Hypertension Mother    Heart Problems Mother    Diabetes Father     Social History   Tobacco Use   Smoking status: Former    Types: Cigarettes   Smokeless tobacco: Never  Vaping Use   Vaping Use: Former  Substance Use Topics   Alcohol use: Never   Drug use: Never    Allergies: No Known Allergies  Medications Prior to Admission  Medication Sig Dispense Refill Last  Dose   citalopram (CELEXA) 10 MG tablet Take 1 tablet (10 mg total) by mouth daily. Increase to 20mg  in 2 weeks if tolerating 60 tablet 3 01/30/2023 at 2300   ondansetron (ZOFRAN-ODT) 4 MG disintegrating tablet Take 4 mg by mouth every 6 (six) hours as needed for nausea or vomiting.   01/30/2023 at 2000   Prenatal Vit-Fe Fumarate-FA (PRENATAL VITAMINS PO) Take by mouth.   01/30/2023 at 2000   Clindamycin-Benzoyl Per, Refr, gel Apply 1 Application topically 2 (two) times daily. 45 g 3 01/29/2023    Review of Systems  Constitutional:  Negative for chills and fever.  HENT:  Negative for congestion and sore throat.   Eyes:  Negative for pain and visual disturbance.  Respiratory:  Negative for cough, chest tightness and shortness of breath.   Cardiovascular:  Negative for chest pain.   Gastrointestinal:  Negative for abdominal pain, diarrhea, nausea and vomiting.  Endocrine: Negative for cold intolerance and heat intolerance.  Genitourinary:  Positive for vaginal discharge. Negative for dysuria and flank pain.  Musculoskeletal:  Positive for back pain.  Skin:  Negative for rash.  Allergic/Immunologic: Negative for food allergies.  Neurological:  Negative for dizziness and light-headedness.  Psychiatric/Behavioral:  Negative for agitation.    Physical Exam   Blood pressure 106/76, pulse 93, temperature 98.9 F (37.2 C), temperature source Oral, resp. rate 16, height 5\' 3"  (1.6 m), weight 101.3 kg, last menstrual period 11/03/2022, SpO2 95 %, unknown if currently breastfeeding.  Physical Exam Vitals and nursing note reviewed.  Constitutional:      General: She is not in acute distress.    Appearance: She is well-developed.  HENT:     Head: Normocephalic and atraumatic.  Eyes:     General: No scleral icterus.    Conjunctiva/sclera: Conjunctivae normal.  Cardiovascular:     Rate and Rhythm: Normal rate and regular rhythm.     Heart sounds: No murmur heard. Pulmonary:     Effort: Pulmonary effort is normal. No respiratory distress.     Breath sounds: No wheezing.  Chest:     Chest wall: Tenderness present.  Breasts:    Right: Normal.     Left: Normal.    Abdominal:     General: Abdomen is flat. Bowel sounds are normal.     Palpations: Abdomen is soft. There is no mass.     Tenderness: There is abdominal tenderness in the epigastric area. There is no right CVA tenderness, left CVA tenderness, guarding or rebound.     Comments: Mild TTP with palpation of epigastric region.   Genitourinary:    Vagina: Normal.  Musculoskeletal:        General: Normal range of motion.     Cervical back: Normal range of motion and neck supple.     Right lower leg: Normal. No swelling. No edema.     Left lower leg: Normal. No swelling. No edema.  Skin:    General: Skin is  warm and dry.     Capillary Refill: Capillary refill takes less than 2 seconds.     Findings: No rash.  Neurological:     Mental Status: She is alert and oriented to person, place, and time.     MAU Course  Procedures  MDM: moderate  This patient presents to the ED for concern of   Chief Complaint  Patient presents with   Back Pain   Vaginal Discharge   Chest Pain     These complains involves an  extensive number of treatment options, and is a complaint that carries with it a high risk of complications and morbidity.  The differential diagnosis for 1.  Chest pain INCLUDES MI, pulmonary embolism, GERD, costochondritis, MSK pain, neuropathy, anxiety  2. Vaginal discharge INCLUDES vaginal infections like gonorrhea, chlamydia, BV, yeast or trich vs normal variant  Co morbidities that complicate the patient evaluation: Tobacco use Family history of early MI in mother (at age 38) Previous gestational hypertension Anxiety   External records from outside source obtained and reviewed including Scanned media records, CareEverywhere, and Prenatal care records  Lab Tests: UA and Wet prep  I ordered, and personally interpreted labs.  The pertinent results include:  UA  positive nitrites, wet mount negative   Cardiac Testing/Monitoring:  EKG was ordered today. I personally reviewed result which showed NSR with no interval abnormalities. No signs of right heart strain or PER  Medicines ordered and prescription drug management:  Medications: Maalox, Tylenol, Flexeril, Macrobid   Reevaluation of the patient after these medicines showed that the patient improved I have reviewed the patients home medicines and have made adjustments as needed  Test Considered: CTA-- not warranted as PE is low likelihood in the setting of normal O2 saturation/lack of hypxia, lack of clot history and not findings on extremity exam, no EKG abnormalities and pain is reproducible on exam.  CXR- considered to  rule out rib fracture given point tenderness on left rib, but patient denied falls or trauma to area and no bruising over the sight.  Renal US- no CVA tenderness on exam.   MAU Course: - Received Maalox -- sx did not improve - Given tylenol with some minimal improvement consistent with costochondritis - Reviewed with client positive UA   After the interventions noted above, I reevaluated the patient and found that they have :improved  Dispostion: discharged  Assessment and Plan   1. Costochondritis, acute   2. Urinary tract infection in mother during second trimester of pregnancy   3. Gastroesophageal reflux disease without esophagitis    Costochondritis, acute - Tylenol scheduled x3 days - Flexeril PRN prescribed, recommended using at night  Urinary tract infection in mother during second trimester of pregnancy - Rx for macrobid sent to pharmacy - Urine TOC at next appt  GERD - Recomnended trial of TUMS - recently started Celexa and this might be medication SE  Future Appointments  Date Time Provider [place]  02/15/2023  8:15 AM [place] [facility]  03/02/2023  8:15 AM Truett Mainland, DO [facility] None  03/16/2023  7:30 AM [facility] NURSE [facility]  03/16/2023  7:45 AM [facility] US5 [facility] [place] 01/31/2023, 12:25 PM

## 2023-01-31 NOTE — MAU Note (Signed)
Shannon Ruiz is a 27 y.o. at [redacted]w[redacted]d here in MAU reporting: yesterday started having pain directly under her left breast that radiated up into her chest. Pain is intermittent. Pain is now starting more in her left back but still radiating into her chest intermittently. Pain is dull and sharp. Stated Celexa 10mg  on Friday for anxiety so she is unsure if this is related to her symptoms. For the past 2-3 days has had a wetness feeling in her panties, no odor or itching. No bleeding.   Onset of complaint: yesterday  Pain score: 4/10  Vitals:   01/31/23 1026  BP: 127/81  Pulse: 99  Resp: 16  Temp: 98.9 F (37.2 C)  SpO2: 97%     FHT:160  Lab orders placed from triage: UA

## 2023-02-01 NOTE — BH Specialist Note (Signed)
Error; Pt rescheduled today due to sickness

## 2023-02-02 LAB — CULTURE, OB URINE: Culture: >=100000 — AB

## 2023-02-03 ENCOUNTER — Other Ambulatory Visit: Payer: Medicaid Other

## 2023-02-03 DIAGNOSIS — Z3482 Encounter for supervision of other normal pregnancy, second trimester: Secondary | ICD-10-CM

## 2023-02-03 NOTE — Progress Notes (Signed)
Patient presents for repeat panorama. Kathrene Alu RN

## 2023-02-10 LAB — PANORAMA PRENATAL TEST FULL PANEL:PANORAMA TEST PLUS 5 ADDITIONAL MICRODELETIONS: FETAL FRACTION: 2.8

## 2023-02-15 ENCOUNTER — Ambulatory Visit: Payer: Medicaid Other | Admitting: Clinical

## 2023-02-21 NOTE — BH Specialist Note (Unsigned)
Pt did not arrive to video visit and did not answer the phone; Left HIPPA-compliant message to call back Abigaelle Verley from [facility] at  [phone] (Maanvi Lecompte's office).  ?; left MyChart message for patient.  ? ?

## 2023-03-02 ENCOUNTER — Encounter: Payer: Medicaid Other | Admitting: Family Medicine

## 2023-03-07 ENCOUNTER — Ambulatory Visit: Payer: Medicaid Other | Admitting: Clinical

## 2023-03-07 DIAGNOSIS — Z91199 Patient's noncompliance with other medical treatment and regimen due to unspecified reason: Secondary | ICD-10-CM

## 2023-03-14 ENCOUNTER — Inpatient Hospital Stay (HOSPITAL_COMMUNITY)
Admission: AD | Admit: 2023-03-14 | Discharge: 2023-03-15 | Disposition: A | Discharge status: Home / Self Care | Payer: Medicaid Other | Attending: Family Medicine | Admitting: Family Medicine

## 2023-03-14 DIAGNOSIS — R112 Nausea with vomiting, unspecified: Secondary | ICD-10-CM

## 2023-03-14 DIAGNOSIS — O26899 Other specified pregnancy related conditions, unspecified trimester: Secondary | ICD-10-CM

## 2023-03-14 DIAGNOSIS — O219 Vomiting of pregnancy, unspecified: Secondary | ICD-10-CM | POA: Insufficient documentation

## 2023-03-14 DIAGNOSIS — O09299 Supervision of pregnancy with other poor reproductive or obstetric history, unspecified trimester: Secondary | ICD-10-CM

## 2023-03-14 DIAGNOSIS — O2340 Unspecified infection of urinary tract in pregnancy, unspecified trimester: Secondary | ICD-10-CM

## 2023-03-14 DIAGNOSIS — Z3A18 18 weeks gestation of pregnancy: Secondary | ICD-10-CM

## 2023-03-14 DIAGNOSIS — Z1152 Encounter for screening for COVID-19: Secondary | ICD-10-CM | POA: Insufficient documentation

## 2023-03-14 DIAGNOSIS — O99282 Endocrine, nutritional and metabolic diseases complicating pregnancy, second trimester: Secondary | ICD-10-CM | POA: Insufficient documentation

## 2023-03-14 DIAGNOSIS — O99891 Other specified diseases and conditions complicating pregnancy: Secondary | ICD-10-CM | POA: Insufficient documentation

## 2023-03-14 DIAGNOSIS — O26892 Other specified pregnancy related conditions, second trimester: Secondary | ICD-10-CM | POA: Insufficient documentation

## 2023-03-14 NOTE — MAU Note (Addendum)
.  Shannon Ruiz is a 27 y.o. at [redacted]w[redacted]d here in MAU reporting a fall last night and landed on her abdomen. Did not have any pain then. When awoke this am had pain in upper abdomen with n/v all day. Unable to keep down anything. Denies VB. Sometimes has pain in lower abdomen and sometimes in lower back but primarily upper abdomen. Pt went to [facility] during Triage to throw up. Also reports having diarrhea numerous times today  Onset of complaint: Tues AM Pain score: 7 Vitals:   03/14/23 2349 03/14/23 2353  BP:  121/75  Pulse: 100   Resp: 20   Temp: (!) 97.5 F (36.4 C)   SpO2: 100%      FHT:155 Lab orders placed from triage:  u/a

## 2023-03-15 ENCOUNTER — Encounter (HOSPITAL_COMMUNITY): Payer: Self-pay | Admitting: Family Medicine

## 2023-03-15 DIAGNOSIS — R112 Nausea with vomiting, unspecified: Secondary | ICD-10-CM

## 2023-03-15 DIAGNOSIS — O26892 Other specified pregnancy related conditions, second trimester: Secondary | ICD-10-CM

## 2023-03-15 DIAGNOSIS — R109 Unspecified abdominal pain: Secondary | ICD-10-CM

## 2023-03-15 DIAGNOSIS — Z1152 Encounter for screening for COVID-19: Secondary | ICD-10-CM | POA: Diagnosis not present

## 2023-03-15 DIAGNOSIS — O219 Vomiting of pregnancy, unspecified: Secondary | ICD-10-CM | POA: Diagnosis not present

## 2023-03-15 DIAGNOSIS — R197 Diarrhea, unspecified: Secondary | ICD-10-CM

## 2023-03-15 DIAGNOSIS — O99282 Endocrine, nutritional and metabolic diseases complicating pregnancy, second trimester: Secondary | ICD-10-CM | POA: Diagnosis not present

## 2023-03-15 DIAGNOSIS — Z3A18 18 weeks gestation of pregnancy: Secondary | ICD-10-CM | POA: Diagnosis not present

## 2023-03-15 DIAGNOSIS — O99891 Other specified diseases and conditions complicating pregnancy: Secondary | ICD-10-CM | POA: Diagnosis present

## 2023-03-15 DIAGNOSIS — O09292 Supervision of pregnancy with other poor reproductive or obstetric history, second trimester: Secondary | ICD-10-CM

## 2023-03-15 LAB — URINALYSIS, ROUTINE W REFLEX MICROSCOPIC
Glucose, UA: NEGATIVE mg/dL
Hgb urine dipstick: NEGATIVE
Ketones, ur: 80 mg/dL — AB
Nitrite: NEGATIVE
Protein, ur: 100 mg/dL — AB
Specific Gravity, Urine: 1.028 (ref 1.005–1.030)
pH: 5 (ref 5.0–8.0)

## 2023-03-15 LAB — COMPREHENSIVE METABOLIC PANEL
ALT: 10 U/L (ref 0–44)
AST: 11 U/L — ABNORMAL LOW (ref 15–41)
Albumin: 3.2 g/dL — ABNORMAL LOW (ref 3.5–5.0)
Alkaline Phosphatase: 60 U/L (ref 38–126)
Anion gap: 11 (ref 5–15)
BUN: 7 mg/dL (ref 6–20)
CO2: 16 mmol/L — ABNORMAL LOW (ref 22–32)
Calcium: 9.1 mg/dL (ref 8.9–10.3)
Chloride: 108 mmol/L (ref 98–111)
Creatinine, Ser: 0.58 mg/dL (ref 0.44–1.00)
GFR, Estimated: >60 mL/min (ref >60)
Glucose, Bld: 110 mg/dL — ABNORMAL HIGH (ref 70–99)
Potassium: 3.1 mmol/L — ABNORMAL LOW (ref 3.5–5.1)
Sodium: 135 mmol/L (ref 135–145)
Total Bilirubin: 0.4 mg/dL (ref 0.3–1.2)
Total Protein: 7.2 g/dL (ref 6.5–8.1)

## 2023-03-15 LAB — CBC
HCT: 39.9 % (ref 36.0–46.0)
Hemoglobin: 13.1 g/dL (ref 12.0–15.0)
MCH: 27.5 pg (ref 26.0–34.0)
MCHC: 32.8 g/dL (ref 30.0–36.0)
MCV: 83.8 fL (ref 80.0–100.0)
Platelets: 417 10*3/uL — ABNORMAL HIGH (ref 150–400)
RBC: 4.76 MIL/uL (ref 3.87–5.11)
RDW: 14.2 % (ref 11.5–15.5)
WBC: 15.2 10*3/uL — ABNORMAL HIGH (ref 4.0–10.5)
nRBC: 0 % (ref 0.0–0.2)

## 2023-03-15 LAB — RESP PANEL BY RT-PCR (RSV, FLU A&B, COVID)  RVPGX2
Influenza A by PCR: NEGATIVE
Influenza B by PCR: NEGATIVE
Resp Syncytial Virus by PCR: NEGATIVE
SARS Coronavirus 2 by RT PCR: NEGATIVE

## 2023-03-15 MED ORDER — LACTATED RINGERS IV SOLN: Freq: Once | INTRAVENOUS | Status: AC

## 2023-03-15 MED ORDER — HYOSCYAMINE SULFATE 0.125 MG SL SUBL
0.2500 mg | SUBLINGUAL_TABLET | Freq: Once | SUBLINGUAL | Status: AC
  Administered 2023-03-15: 0.25 mg via SUBLINGUAL
  Filled 2023-03-15: qty 2

## 2023-03-15 MED ORDER — LACTATED RINGERS IV SOLN: Freq: Once | INTRAVENOUS | Status: DC

## 2023-03-15 MED ORDER — ONDANSETRON 4 MG PO TBDP: 4.0000 mg | ORAL_TABLET | Freq: Four times a day (QID) | ORAL | 0 refills | Status: DC | PRN

## 2023-03-15 MED ORDER — FAMOTIDINE IN NACL 20-0.9 MG/50ML-% IV SOLN
20.0000 mg | Freq: Once | INTRAVENOUS | Status: AC
  Administered 2023-03-15: 20 mg via INTRAVENOUS
  Filled 2023-03-15: qty 50

## 2023-03-15 MED ORDER — CITALOPRAM HYDROBROMIDE 10 MG PO TABS
10.0000 mg | ORAL_TABLET | Freq: Once | ORAL | Status: AC
  Administered 2023-03-15: 10 mg via ORAL
  Filled 2023-03-15: qty 1

## 2023-03-15 MED ORDER — ONDANSETRON HCL 4 MG/2ML IJ SOLN
4.0000 mg | Freq: Once | INTRAMUSCULAR | Status: AC
  Administered 2023-03-15: 4 mg via INTRAVENOUS
  Filled 2023-03-15: qty 2

## 2023-03-15 NOTE — MAU Provider Note (Signed)
Chief Complaint:  Fall, Emesis During Pregnancy, and Diarrhea   Event Date/Time   First Provider Initiated Contact with Patient 03/15/23 0032     HPI: Shannon Ruiz is a 27 y.o. [id number] at 36w6dwho presents to maternity admissions reporting nausea, vomiting and diarrhea which started this evening.  Had a fall yesterday and landed on her abdomen.  Had no pain until tonight, but was worried this pain was related to fall Pain is consistent with intestinal cramping associated with nausea, vomiting, and diarrhea  No fever.. She denies LOF, vaginal bleeding,h/a, dizziness,  constipation or fever/chills.    Diarrhea  This is a new problem. The current episode started today. Associated symptoms include abdominal pain and vomiting. Pertinent negatives include no chills or fever. Nothing aggravates the symptoms. There are no known risk factors. She has tried nothing for the symptoms.  Emesis  This is a new problem. The current episode started today. There has been no fever. Associated symptoms include abdominal pain and diarrhea. Pertinent negatives include no chills or fever. She has tried nothing for the symptoms.  Abdominal Pain This is a new problem. The current episode started today. The problem occurs intermittently. The problem has been unchanged. The pain is located in the generalized abdominal region. The quality of the pain is colicky and cramping. The abdominal pain does not radiate. Associated symptoms include diarrhea and vomiting. Pertinent negatives include no constipation or fever. Nothing aggravates the pain. The pain is relieved by Nothing. She has tried nothing for the symptoms.   RN Note: Shannon Ruiz is a 27 y.o. at [redacted]w[redacted]d here in MAU reporting a fall last night and landed on her abdomen. Did not have any pain then. When awoke this am had pain in upper abdomen with n/v all day. Unable to keep down anything. Denies VB. Sometimes has pain in lower abdomen and sometimes in lower back but primarily  upper abdomen. Pt went to [facility] during Triage to throw up. Also reports having diarrhea numerous times today  Onset of complaint: Tues AM                   Pain score: 7  Past Medical History: Past Medical History:  Diagnosis Date   Anemia    Anxiety    Depression    Supervision of other high risk pregnancy, antepartum, unspecified trimester 01/09/2021   Needs PP MMR    Past obstetric history: OB History  Gravida Para Term Preterm AB Living  4 2 2   1 2   SAB IAB Ectopic Multiple Live Births  1     0 2    # Outcome Date GA Lbr Len/2nd Weight Sex Delivery Anes PTL Lv  4 Current           3 Term 03/14/22 [redacted]w[redacted]d 05:02 / 00:17 3510 g M Vag-Spont EPI  LIV     Birth Comments: wnl  2 Term 01/10/21 [redacted]w[redacted]d 30:25 / 00:14 2509 g F Vag-Spont EPI  LIV  1 SAB 2014            Past Surgical History: Past Surgical History:  Procedure Laterality Date   NO PAST SURGERIES      Family History: Family History  Problem Relation Age of Onset   Anxiety disorder Mother    Hypertension Mother    Heart Problems Mother    Diabetes Father     Social History: Social History   Tobacco Use   Smoking status: Former    Types: Cigarettes  Smokeless tobacco: Never  Vaping Use   Vaping Use: Former  Substance Use Topics   Alcohol use: Never   Drug use: Never    Allergies: No Known Allergies  Meds:  Medications Prior to Admission  Medication Sig Dispense Refill Last Dose   citalopram (CELEXA) 10 MG tablet Take 1 tablet (10 mg total) by mouth daily. Increase to 20mg  in 2 weeks if tolerating 60 tablet 3    Clindamycin-Benzoyl Per, Refr, gel Apply 1 Application topically 2 (two) times daily. 45 g 3    cyclobenzaprine (FLEXERIL) 10 MG tablet Take 1 tablet (10 mg total) by mouth 2 (two) times daily as needed for muscle spasms. 20 tablet 0    nitrofurantoin, macrocrystal-monohydrate, (MACROBID) 100 MG capsule Take 1 capsule (100 mg total) by mouth 2 (two) times daily. 14 capsule 1    ondansetron  (ZOFRAN-ODT) 4 MG disintegrating tablet Take 4 mg by mouth every 6 (six) hours as needed for nausea or vomiting.      Prenatal Vit-Fe Fumarate-FA (PRENATAL VITAMINS PO) Take by mouth.       I have reviewed patient's Past Medical Hx, Surgical Hx, Family Hx, Social Hx, medications and allergies.   ROS:  Review of Systems  Constitutional:  Negative for chills and fever.  Gastrointestinal:  Positive for abdominal pain, diarrhea and vomiting. Negative for constipation.   Other systems negative  Physical Exam  Patient Vitals for the past 24 hrs:  BP Temp Pulse Resp SpO2 Height Weight  03/14/23 2353 121/75 -- -- -- -- -- --  03/14/23 2349 -- (!) 97.5 F (36.4 C) 100 20 100 % 5\' 4"  (1.626 m) 98.4 kg   Constitutional: Well-developed, well-nourished female in no acute distress.  Cardiovascular: normal rate and rhythm Respiratory: normal effort GI: Abd soft, mild generalized tenderness,  No rebound or guarding. MS: Extremities nontender, no edema, normal ROM Neurologic: Alert and oriented x 4.  GU: Neg CVAT.    FHT:   155  Labs: Results for orders placed or performed during the hospital encounter of 03/14/23 (from the past 24 hour(s))  Urinalysis, Routine w reflex microscopic -Urine, Clean Catch     Status: Abnormal   Collection Time: 03/15/23 12:07 AM  Result Value Ref Range   Color, Urine AMBER (A) YELLOW   APPearance HAZY (A) CLEAR   Specific Gravity, Urine 1.028 1.005 - 1.030   pH 5.0 5.0 - 8.0   Glucose, UA NEGATIVE NEGATIVE mg/dL   Hgb urine dipstick NEGATIVE NEGATIVE   Bilirubin Urine SMALL (A) NEGATIVE   Ketones, ur 80 (A) NEGATIVE mg/dL   Protein, ur 100 (A) NEGATIVE mg/dL   Nitrite NEGATIVE NEGATIVE   Leukocytes,Ua TRACE (A) NEGATIVE   RBC / HPF 0-5 0 - 5 RBC/hpf   WBC, UA 11-20 0 - 5 WBC/hpf   Bacteria, UA FEW (A) NONE SEEN   Squamous Epithelial / HPF 0-5 0 - 5 /HPF   Mucus PRESENT   CBC     Status: Abnormal   Collection Time: 03/15/23 12:31 AM  Result Value Ref  Range   WBC 15.2 (H) 4.0 - 10.5 K/uL   RBC 4.76 3.87 - 5.11 MIL/uL   Hemoglobin 13.1 12.0 - 15.0 g/dL   HCT 39.9 36.0 - 46.0 %   MCV 83.8 80.0 - 100.0 fL   MCH 27.5 26.0 - 34.0 pg   MCHC 32.8 30.0 - 36.0 g/dL   RDW 14.2 11.5 - 15.5 %   Platelets 417 (H) 150 - 400 K/uL  nRBC 0.0 0.0 - 0.2 %  Comprehensive metabolic panel     Status: Abnormal   Collection Time: 03/15/23 12:31 AM  Result Value Ref Range   Sodium 135 135 - 145 mmol/L   Potassium 3.1 (L) 3.5 - 5.1 mmol/L   Chloride 108 98 - 111 mmol/L   CO2 16 (L) 22 - 32 mmol/L   Glucose, Bld 110 (H) 70 - 99 mg/dL   BUN 7 6 - 20 mg/dL   Creatinine, Ser 0.58 0.44 - 1.00 mg/dL   Calcium 9.1 8.9 - 10.3 mg/dL   Total Protein 7.2 6.5 - 8.1 g/dL   Albumin 3.2 (L) 3.5 - 5.0 g/dL   AST 11 (L) 15 - 41 U/L   ALT 10 0 - 44 U/L   Alkaline Phosphatase 60 38 - 126 U/L   Total Bilirubin 0.4 0.3 - 1.2 mg/dL   GFR, Estimated >60 >60 mL/min   Anion gap 11 5 - 15  Resp panel by RT-PCR (RSV, Flu A&B, Covid) Anterior Nasal Swab     Status: None   Collection Time: 03/15/23  1:15 AM   Specimen: Anterior Nasal Swab  Result Value Ref Range   SARS Coronavirus 2 by RT PCR NEGATIVE NEGATIVE   Influenza A by PCR NEGATIVE NEGATIVE   Influenza B by PCR NEGATIVE NEGATIVE   Resp Syncytial Virus by PCR NEGATIVE NEGATIVE    A/Positive/-- (01/31 1038)  Imaging:  No results found.  MAU Course/MDM: I have reviewed the triage vital signs and the nursing notes.   Pertinent labs & imaging results that were available during my care of the patient were reviewed by me and considered in my medical decision making (see chart for details).      I have reviewed her medical records including past results, notes and treatments.   I have ordered labs and reviewed results. Mild leukocytosis noted.  Mild hypokalemia.  Urine indicative of dehydration  Treatments in MAU included IV hydration, Pepcid, Levsin, and Zofran.    She felt much better after this treatment  Was  able to tolerate PO intake.   Assessment: Single IUP at [redacted]w[redacted]d Nausea, vomiting, diarrhea, likely gastroenteritis Dehydration  Plan: Discharge home Rx Zofran for nausea Follow up in Office for prenatal visits and recheck Encouraged to return if she develops worsening of symptoms, increase in pain, fever, or other concerning symptoms.   Pt stable at time of discharge.  Hansel Feinstein CNM, MSN Certified Nurse-Midwife 03/15/2023 12:32 AM

## 2023-03-16 ENCOUNTER — Ambulatory Visit: Payer: Medicaid Other

## 2023-03-16 DIAGNOSIS — O09299 Supervision of pregnancy with other poor reproductive or obstetric history, unspecified trimester: Secondary | ICD-10-CM

## 2023-03-16 DIAGNOSIS — O2340 Unspecified infection of urinary tract in pregnancy, unspecified trimester: Secondary | ICD-10-CM

## 2023-03-23 ENCOUNTER — Encounter: Payer: Self-pay | Admitting: Obstetrics & Gynecology

## 2023-03-23 ENCOUNTER — Ambulatory Visit (INDEPENDENT_AMBULATORY_CARE_PROVIDER_SITE_OTHER): Payer: Medicaid Other | Admitting: Obstetrics & Gynecology

## 2023-03-23 VITALS — BP 122/85 | HR 98 | Wt 223.0 lb

## 2023-03-23 DIAGNOSIS — O2340 Unspecified infection of urinary tract in pregnancy, unspecified trimester: Secondary | ICD-10-CM

## 2023-03-23 DIAGNOSIS — Z8759 Personal history of other complications of pregnancy, childbirth and the puerperium: Secondary | ICD-10-CM

## 2023-03-23 DIAGNOSIS — O99212 Obesity complicating pregnancy, second trimester: Secondary | ICD-10-CM

## 2023-03-23 DIAGNOSIS — O99210 Obesity complicating pregnancy, unspecified trimester: Secondary | ICD-10-CM

## 2023-03-23 DIAGNOSIS — O09892 Supervision of other high risk pregnancies, second trimester: Secondary | ICD-10-CM

## 2023-03-23 DIAGNOSIS — Z3A20 20 weeks gestation of pregnancy: Secondary | ICD-10-CM

## 2023-03-23 DIAGNOSIS — O09299 Supervision of pregnancy with other poor reproductive or obstetric history, unspecified trimester: Secondary | ICD-10-CM

## 2023-03-23 DIAGNOSIS — O2342 Unspecified infection of urinary tract in pregnancy, second trimester: Secondary | ICD-10-CM

## 2023-03-23 DIAGNOSIS — O09292 Supervision of pregnancy with other poor reproductive or obstetric history, second trimester: Secondary | ICD-10-CM

## 2023-03-23 DIAGNOSIS — O09899 Supervision of other high risk pregnancies, unspecified trimester: Secondary | ICD-10-CM

## 2023-03-23 MED ORDER — ASPIRIN 81 MG PO TBEC: 81.0000 mg | DELAYED_RELEASE_TABLET | Freq: Every day | ORAL | 2 refills | Status: DC

## 2023-03-23 NOTE — Progress Notes (Signed)
   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  She is currently monitored for the following issues for this high-risk pregnancy and has Cigarette smoker; Anxiety disorder affecting pregnancy, antepartum; Supervision of other high risk pregnancy, antepartum, unspecified trimester; Obesity in pregnancy, antepartum; History of gestational hypertension; Short interval between pregnancies affecting pregnancy, antepartum; History of postpartum hemorrhage; Urinary tract infection affecting pregnancy; and History of IUGR (intrauterine growth retardation) and stillbirth, currently pregnant, unspecified trimester on their problem list.  Patient reports no complaints.  Contractions: Not present. Vag. Bleeding: None.  Movement: Absent. Denies leaking of fluid.   The following portions of the patient's history were reviewed and updated as appropriate: allergies, current medications, past family history, past medical history, past social history, past surgical history and problem list.   Objective:   Vitals:   03/23/23 1412  BP: 122/85  Pulse: 98  Weight: 223 lb (101.2 kg)    Fetal Status:     Movement: Absent     General:  Alert, oriented and cooperative. Patient is in no acute distress.  Skin: Skin is warm and dry. No rash noted.   Cardiovascular: Normal heart rate noted  Respiratory: Normal respiratory effort, no problems with respiration noted  Abdomen: Soft, gravid, appropriate for gestational age.  Pain/Pressure: Absent     Pelvic: Cervical exam deferred        Extremities: Normal range of motion.  Edema: None  Mental Status: Normal mood and affect. Normal behavior. Normal judgment and thought content.   Assessment and Plan:  Pregnancy: [id number] at [redacted]w[redacted]d 1. History of IUGR (intrauterine growth retardation) and stillbirth, currently pregnant, unspecified trimester Scheduled for anatomy scan, will do growth scans as  recommended by MFM.  2. History of gestational hypertension ASA prescribed. Stable BP today. - aspirin EC 81 MG tablet; Take 1 tablet (81 mg total) by mouth daily. Take daily for rest of pregnancy for prevention of preeclampsia  Dispense: 300 tablet; Refill: 2  3. Obesity in pregnancy, antepartum BMI 38 today, continue to monitor. - aspirin EC 81 MG tablet; Take 1 tablet (81 mg total) by mouth daily. Take daily for rest of pregnancy for prevention of preeclampsia  Dispense: 300 tablet; Refill: 2  4. Urinary tract infection affecting pregnancy TOC done today - Culture, OB Urine  5. [redacted] weeks gestation of pregnancy 6. Supervision of other high risk pregnancy, antepartum, unspecified trimester LR NIPS, AFP today, will follow up results and manage accordingly. Anatomy scan already scheduled. - AFP, Serum, Open Spina Bifida Preterm labor symptoms and general obstetric precautions including but not limited to vaginal bleeding, contractions, leaking of fluid and fetal movement were reviewed in detail with the patient. Please refer to After Visit Summary for other counseling recommendations.   Return in about 4 weeks (around 04/20/2023) for OFFICE OB VISIT (MD or APP).  Future Appointments  Date Time Provider [place]  04/11/2023 12:30 PM [facility] NURSE [facility]  04/11/2023 12:45 PM [facility] US6 [facility]  04/20/2023  8:15 AM Truett Mainland, DO [facility] None  05/18/2023  8:15 AM Nehemiah Settle, Tanna Savoy, DO [facility] None    Verita Schneiders, MD

## 2023-03-25 LAB — AFP, SERUM, OPEN SPINA BIFIDA
AFP MoM: 0.98
AFP Value: 44.9 ng/mL
Gest. Age on Collection Date: 20 weeks
Maternal Age At EDD: 27.4 yr
OSBR Risk 1 IN: 10000
Test Results:: NEGATIVE
Weight: 223 [lb_av]

## 2023-03-25 LAB — URINE CULTURE, OB REFLEX

## 2023-03-25 LAB — CULTURE, OB URINE

## 2023-04-11 ENCOUNTER — Ambulatory Visit: Payer: Medicaid Other | Attending: Obstetrics and Gynecology

## 2023-04-11 ENCOUNTER — Ambulatory Visit: Payer: Medicaid Other

## 2023-04-20 ENCOUNTER — Encounter: Payer: Self-pay | Admitting: Family Medicine

## 2023-04-20 ENCOUNTER — Ambulatory Visit (INDEPENDENT_AMBULATORY_CARE_PROVIDER_SITE_OTHER): Payer: Medicaid Other | Admitting: Family Medicine

## 2023-04-20 VITALS — BP 116/70 | HR 79 | Wt 220.0 lb

## 2023-04-20 DIAGNOSIS — O99340 Other mental disorders complicating pregnancy, unspecified trimester: Secondary | ICD-10-CM

## 2023-04-20 DIAGNOSIS — O09899 Supervision of other high risk pregnancies, unspecified trimester: Secondary | ICD-10-CM

## 2023-04-20 DIAGNOSIS — Z3A24 24 weeks gestation of pregnancy: Secondary | ICD-10-CM

## 2023-04-20 DIAGNOSIS — F419 Anxiety disorder, unspecified: Secondary | ICD-10-CM

## 2023-04-20 DIAGNOSIS — O09299 Supervision of pregnancy with other poor reproductive or obstetric history, unspecified trimester: Secondary | ICD-10-CM

## 2023-04-20 DIAGNOSIS — Z8759 Personal history of other complications of pregnancy, childbirth and the puerperium: Secondary | ICD-10-CM

## 2023-04-20 NOTE — Progress Notes (Signed)
   PRENATAL VISIT NOTE  Subjective:  Shannon Ruiz is a 27 y.o. [id number] at [redacted]w[redacted]d being seen today for ongoing prenatal care.  She is currently monitored for the following issues for this low-risk pregnancy and has Cigarette smoker; Anxiety disorder affecting pregnancy, antepartum; Supervision of other high risk pregnancy, antepartum, unspecified trimester; Obesity in pregnancy, antepartum; History of gestational hypertension; Short interval between pregnancies affecting pregnancy, antepartum; History of postpartum hemorrhage; Urinary tract infection affecting pregnancy; and History of IUGR (intrauterine growth retardation) and stillbirth, currently pregnant, unspecified trimester on their problem list.  Patient reports no complaints.  Contractions: Not present. Vag. Bleeding: None.  Movement: Present. Denies leaking of fluid.   The following portions of the patient's history were reviewed and updated as appropriate: allergies, current medications, past family history, past medical history, past social history, past surgical history and problem list.   Objective:   Vitals:   04/20/23 0807  BP: 116/70  Pulse: 79  Weight: 220 lb (99.8 kg)    Fetal Status: Fetal Heart Rate (bpm): 138   Movement: Present     General:  Alert, oriented and cooperative. Patient is in no acute distress.  Skin: Skin is warm and dry. No rash noted.   Cardiovascular: Normal heart rate noted  Respiratory: Normal respiratory effort, no problems with respiration noted  Abdomen: Soft, gravid, appropriate for gestational age.  Pain/Pressure: Absent     Pelvic: Cervical exam deferred        Extremities: Normal range of motion.  Edema: None  Mental Status: Normal mood and affect. Normal behavior. Normal judgment and thought content.   Assessment and Plan:  Pregnancy: [id number] at [redacted]w[redacted]d 1. [redacted] weeks gestation of pregnancy  2. Supervision of other high risk pregnancy, antepartum, unspecified trimester FHT and FH normal GTT  next appt  3. History of IUGR (intrauterine growth retardation) and stillbirth, currently pregnant, unspecified trimester Serial [place] for growth. Had to reschedule anatomy scan - scheduled for 5/15  4. History of postpartum hemorrhage Methergine and TXA with next delivery  5. History of gestational hypertension BP normal On ASA   6. Anxiety disorder affecting pregnancy, antepartum On celexa  Doing well at this dose. Did have 1-2 weeks of nausea, vomiting, headaches. Now has minimal side effects. If she needs to increase dose, should switch to lexapro to see if will avoid the side effects.   Preterm labor symptoms and general obstetric precautions including but not limited to vaginal bleeding, contractions, leaking of fluid and fetal movement were reviewed in detail with the patient. Please refer to After Visit Summary for other counseling recommendations.   No follow-ups on file.  Future Appointments  Date Time Provider Department Center  05/10/2023  7:30 AM [facility] NURSE [facility]  05/10/2023  7:45 AM [facility] US4 [facility]  05/18/2023  8:15 AM Levie Heritage, DO [facility] None  06/01/2023 10:55 AM Levie Heritage, DO [facility] None  06/15/2023 11:15 AM Milas Hock, MD [facility] None    Levie Heritage, DO

## 2023-05-10 ENCOUNTER — Other Ambulatory Visit: Payer: Self-pay

## 2023-05-10 ENCOUNTER — Ambulatory Visit: Payer: Medicaid Other | Admitting: *Deleted

## 2023-05-10 ENCOUNTER — Ambulatory Visit: Payer: Medicaid Other | Attending: Family Medicine

## 2023-05-10 VITALS — BP 122/70 | HR 89

## 2023-05-10 DIAGNOSIS — O99212 Obesity complicating pregnancy, second trimester: Secondary | ICD-10-CM

## 2023-05-10 DIAGNOSIS — O09899 Supervision of other high risk pregnancies, unspecified trimester: Secondary | ICD-10-CM

## 2023-05-10 DIAGNOSIS — Z362 Encounter for other antenatal screening follow-up: Secondary | ICD-10-CM

## 2023-05-10 DIAGNOSIS — O09292 Supervision of pregnancy with other poor reproductive or obstetric history, second trimester: Secondary | ICD-10-CM | POA: Diagnosis not present

## 2023-05-10 DIAGNOSIS — O2340 Unspecified infection of urinary tract in pregnancy, unspecified trimester: Secondary | ICD-10-CM

## 2023-05-10 DIAGNOSIS — O09299 Supervision of pregnancy with other poor reproductive or obstetric history, unspecified trimester: Secondary | ICD-10-CM | POA: Diagnosis present

## 2023-05-10 DIAGNOSIS — Z3A26 26 weeks gestation of pregnancy: Secondary | ICD-10-CM | POA: Diagnosis not present

## 2023-05-10 DIAGNOSIS — E669 Obesity, unspecified: Secondary | ICD-10-CM

## 2023-05-18 ENCOUNTER — Encounter: Payer: Medicaid Other | Admitting: Family Medicine

## 2023-05-23 ENCOUNTER — Ambulatory Visit (INDEPENDENT_AMBULATORY_CARE_PROVIDER_SITE_OTHER): Payer: Medicaid Other

## 2023-05-23 DIAGNOSIS — Z3A28 28 weeks gestation of pregnancy: Secondary | ICD-10-CM

## 2023-05-23 DIAGNOSIS — Z3483 Encounter for supervision of other normal pregnancy, third trimester: Secondary | ICD-10-CM

## 2023-05-23 DIAGNOSIS — O24419 Gestational diabetes mellitus in pregnancy, unspecified control: Secondary | ICD-10-CM

## 2023-05-23 NOTE — Progress Notes (Signed)
Patient presents for 2 hr GTT. Patient was sent to the lab for glucose test. Shannon Ruiz l Sosie Gato, CMA

## 2023-05-25 LAB — CBC
Hematocrit: 34.1 % (ref 34.0–46.6)
Hemoglobin: 11.4 g/dL (ref 11.1–15.9)
MCH: 28.1 pg (ref 26.6–33.0)
MCHC: 33.4 g/dL (ref 31.5–35.7)
MCV: 84 fL (ref 79–97)
Platelets: 394 10*3/uL (ref 150–450)
RBC: 4.06 x10E6/uL (ref 3.77–5.28)
RDW: 13.5 % (ref 11.7–15.4)
WBC: 13.2 10*3/uL — ABNORMAL HIGH (ref 3.4–10.8)

## 2023-05-25 LAB — RPR: RPR Ser Ql: NONREACTIVE

## 2023-05-25 LAB — HIV ANTIBODY (ROUTINE TESTING W REFLEX): HIV Screen 4th Generation wRfx: NONREACTIVE

## 2023-05-25 LAB — GLUCOSE TOLERANCE, 2 HOURS W/ 1HR
Glucose, 1 hour: 121 mg/dL (ref 70–179)
Glucose, 2 hour: 76 mg/dL (ref 70–152)
Glucose, Fasting: 100 mg/dL — ABNORMAL HIGH (ref 70–91)

## 2023-05-26 ENCOUNTER — Encounter: Payer: Self-pay | Admitting: Family Medicine

## 2023-05-26 MED ORDER — BLOOD GLUCOSE MONITORING SUPPL DEVI: 1.0000 | Freq: Three times a day (TID) | 0 refills | Status: DC

## 2023-05-26 MED ORDER — LANCETS MISC. MISC: 1.0000 | Freq: Three times a day (TID) | 2 refills | Status: AC

## 2023-05-26 MED ORDER — BLOOD GLUCOSE TEST VI STRP: 1.0000 | ORAL_STRIP | Freq: Three times a day (TID) | 2 refills | Status: AC

## 2023-05-26 NOTE — Addendum Note (Signed)
Addended by: Levie Heritage on: 05/26/2023 08:22 AM   Modules accepted: Orders

## 2023-05-30 NOTE — Telephone Encounter (Signed)
Patient wasn't truly fasting when she took the 2hr GTT. Will repeat.

## 2023-06-01 ENCOUNTER — Ambulatory Visit
Admission: EM | Admit: 2023-06-01 | Discharge: 2023-06-01 | Disposition: A | Discharge status: Home / Self Care | Payer: Medicaid Other | Attending: Internal Medicine | Admitting: Internal Medicine

## 2023-06-01 ENCOUNTER — Telehealth (INDEPENDENT_AMBULATORY_CARE_PROVIDER_SITE_OTHER): Payer: Medicaid Other | Admitting: Family Medicine

## 2023-06-01 DIAGNOSIS — O99210 Obesity complicating pregnancy, unspecified trimester: Secondary | ICD-10-CM

## 2023-06-01 DIAGNOSIS — O09899 Supervision of other high risk pregnancies, unspecified trimester: Secondary | ICD-10-CM

## 2023-06-01 DIAGNOSIS — S0501XA Injury of conjunctiva and corneal abrasion without foreign body, right eye, initial encounter: Secondary | ICD-10-CM

## 2023-06-01 DIAGNOSIS — O24419 Gestational diabetes mellitus in pregnancy, unspecified control: Secondary | ICD-10-CM

## 2023-06-01 DIAGNOSIS — O09299 Supervision of pregnancy with other poor reproductive or obstetric history, unspecified trimester: Secondary | ICD-10-CM

## 2023-06-01 DIAGNOSIS — Z8759 Personal history of other complications of pregnancy, childbirth and the puerperium: Secondary | ICD-10-CM

## 2023-06-01 MED ORDER — ERYTHROMYCIN 5 MG/GM OP OINT: TOPICAL_OINTMENT | OPHTHALMIC | 0 refills | Status: DC

## 2023-06-01 NOTE — Discharge Instructions (Addendum)
You have a corneal abrasion which is a scratch of your eye.  I have prescribed an ointment which will help prevent infection and soothe the eye.  It should heal over in the next few days but if it does not, please contact ophthalmology for further evaluation and management.

## 2023-06-01 NOTE — ED Provider Notes (Signed)
[facility]    CSN: [id number] Arrival date & time: 06/01/23  1108      History   Chief Complaint No chief complaint on file.   HPI Shannon Ruiz is a 27 y.o. female.   Patient presents with right eye injury.  Patient reports that she was horse playing with her significant other when he accidentally poked her in the right eyeball with his finger yesterday.  She reports that it is very painful she has had a little bit of blurry vision.  She does not wear contacts or glasses.     Past Medical History:  Diagnosis Date   Anemia    Anxiety    Depression    Supervision of other high risk pregnancy, antepartum, unspecified trimester 01/09/2021   Needs PP MMR    Patient Active Problem List   Diagnosis Date Noted   Gestational diabetes mellitus (GDM) in third trimester 06/01/2023   Urinary tract infection affecting pregnancy 01/31/2023   History of IUGR (intrauterine growth retardation) and stillbirth, currently pregnant, unspecified trimester 01/31/2023   Short interval between pregnancies affecting pregnancy, antepartum 01/25/2023   History of postpartum hemorrhage 01/25/2023   History of gestational hypertension 03/14/2022   Obesity in pregnancy, antepartum 09/03/2021   Supervision of other high risk pregnancy, antepartum, unspecified trimester 01/09/2021   Cigarette smoker 06/30/2020   Anxiety disorder affecting pregnancy, antepartum 06/30/2020    Past Surgical History:  Procedure Laterality Date   NO PAST SURGERIES      OB History     Gravida  4   Para  2   Term  2   Preterm      AB  1   Living  2      SAB  1   IAB      Ectopic      Multiple  0   Live Births  2            Home Medications    Prior to Admission medications   Medication Sig Start Date End Date Taking? Authorizing Provider  erythromycin ophthalmic ointment Place a 1/2 inch ribbon of ointment into the lower eyelid 4 times daily. 06/01/23  Yes Gustavus Bryant, FNP   aspirin EC 81 MG tablet Take 1 tablet (81 mg total) by mouth daily. Take daily for rest of pregnancy for prevention of preeclampsia Patient not taking: Reported on 05/10/2023 03/23/23   Tereso Newcomer, MD  Blood Glucose Monitoring Suppl DEVI 1 each by Does not apply route in the morning, at noon, and at bedtime. May substitute to any manufacturer covered by patient's insurance. 05/26/23   Levie Heritage, DO  citalopram (CELEXA) 10 MG tablet Take 1 tablet (10 mg total) by mouth daily. Increase to 20mg  in 2 weeks if tolerating 01/25/23   Levie Heritage, DO  Clindamycin-Benzoyl Per, Refr, gel Apply 1 Application topically 2 (two) times daily. 01/25/23   Levie Heritage, DO  Glucose Blood (BLOOD GLUCOSE TEST STRIPS) STRP 1 each by In Vitro route in the morning, at noon, and at bedtime. May substitute to any manufacturer covered by patient's insurance. 05/26/23 06/25/23  Levie Heritage, DO  Lancets Misc. MISC 1 each by Does not apply route in the morning, at noon, and at bedtime. May substitute to any manufacturer covered by patient's insurance. 05/26/23 06/25/23  Levie Heritage, DO  Prenatal Vit-Fe Fumarate-FA (PRENATAL VITAMINS PO) Take by mouth.    [provider]    Family History  Family History  Problem Relation Age of Onset   Anxiety disorder Mother    Hypertension Mother    Heart Problems Mother    Diabetes Father     Social History Social History   Tobacco Use   Smoking status: Former    Types: Cigarettes   Smokeless tobacco: Never  Vaping Use   Vaping Use: Former  Substance Use Topics   Alcohol use: Never   Drug use: Never     Allergies   Patient has no known allergies.   Review of Systems Review of Systems Per HPI  Physical Exam Triage Vital Signs ED Triage Vitals  Enc Vitals Group     BP 06/01/23 1252 117/77     Pulse Rate 06/01/23 1252 85     Resp 06/01/23 1252 20     Temp 06/01/23 1252 97.8 F (36.6 C)     Temp Source 06/01/23 1252 Oral      SpO2 06/01/23 1252 96 %     Weight --      Height --      Head Circumference --      Peak Flow --      Pain Score 06/01/23 1253 6     Pain Loc --      Pain Edu? --      Excl. in GC? --    No data found.  Updated Vital Signs BP 117/77 (BP Location: Left Arm)   Pulse 85   Temp 97.8 F (36.6 C) (Oral)   Resp 20   LMP 11/03/2022   SpO2 96%   Visual Acuity Right Eye Distance: 20/40 Left Eye Distance: 20/20 Bilateral Distance: 20/25  Right Eye Near:   Left Eye Near:    Bilateral Near:     Physical Exam Constitutional:      General: She is not in acute distress.    Appearance: Normal appearance. She is not toxic-appearing or diaphoretic.  HENT:     Head: Normocephalic and atraumatic.  Eyes:     General: Lids are normal. Lids are everted, no foreign bodies appreciated. Vision grossly intact. Gaze aligned appropriately.     Extraocular Movements: Extraocular movements intact.     Conjunctiva/sclera: Conjunctivae normal.     Pupils: Pupils are equal, round, and reactive to light.     Right eye: Corneal abrasion and fluorescein uptake present.      Comments: Patient has a corneal abrasion noted with fluorescein reuptake to the right upper eye.  No obvious global trauma or ulcer noted.  Pulmonary:     Effort: Pulmonary effort is normal.  Neurological:     General: No focal deficit present.     Mental Status: She is alert and oriented to person, place, and time. Mental status is at baseline.  Psychiatric:        Mood and Affect: Mood normal.        Behavior: Behavior normal.        Thought Content: Thought content normal.        Judgment: Judgment normal.      UC Treatments / Results  Labs (all labs ordered are listed, but only abnormal results are displayed) Labs Reviewed - No data to display  EKG   Radiology No results found.  Procedures Procedures (including critical care time)  Medications Ordered in UC Medications - No data to display  Initial  Impression / Assessment and Plan / UC Course  I have reviewed the triage vital signs and the nursing notes.  Pertinent labs & imaging results that were available during my care of the patient were reviewed by me and considered in my medical decision making (see chart for details).     Patient has corneal abrasion.  Visual acuity appears okay. Do not think emergent ophthalmology evaluation is necessary.  Will treat with erythromycin ointment.  Advised patient to follow-up with ophthalmology if symptoms persist or worsen.  Patient verbalized understanding and was agreeable with plan. Final Clinical Impressions(s) / UC Diagnoses   Final diagnoses:  Abrasion of right cornea, initial encounter     Discharge Instructions      You have a corneal abrasion which is a scratch of your eye.  I have prescribed an ointment which will help prevent infection and soothe the eye.  It should heal over in the next few days but if it does not, please contact ophthalmology for further evaluation and management.    ED Prescriptions     Medication Sig Dispense Auth. Provider   erythromycin ophthalmic ointment Place a 1/2 inch ribbon of ointment into the lower eyelid 4 times daily. 3.5 g Gustavus Bryant, [place]      PDMP not reviewed this encounter.   Gustavus Bryant, [place] 06/01/23 1415

## 2023-06-01 NOTE — ED Triage Notes (Signed)
Pt reports after hoarse playing with her childs father he poked her in the right eye x 1 day. Pt reports her right eye is painful, watery, red, , sensitive to light, and blurred. States she has a headache as well.

## 2023-06-01 NOTE — Progress Notes (Signed)
OBSTETRICS PRENATAL VIRTUAL VISIT ENCOUNTER NOTE  Provider location: Center for [facility] at [facility]   Patient location: Home  I connected with Shannon Ruiz on 06/01/23 at 10:55 AM EDT by MyChart Video Encounter and verified that I am speaking with the correct person using two identifiers. I discussed the limitations, risks, security and privacy concerns of performing an evaluation and management service virtually and the availability of in person appointments. I also discussed with the patient that there may be a patient responsible charge related to this service. The patient expressed understanding and agreed to proceed. Subjective:  Shannon Ruiz is a 27 y.o. Z6X0960 at [redacted]w[redacted]d being seen today for ongoing prenatal care.  She is currently monitored for the following issues for this high-risk pregnancy and has Cigarette smoker; Anxiety disorder affecting pregnancy, antepartum; Supervision of other high risk pregnancy, antepartum, unspecified trimester; Obesity in pregnancy, antepartum; History of gestational hypertension; Short interval between pregnancies affecting pregnancy, antepartum; History of postpartum hemorrhage; Urinary tract infection affecting pregnancy; History of IUGR (intrauterine growth retardation) and stillbirth, currently pregnant, unspecified trimester; and Gestational diabetes mellitus (GDM) in third trimester on their problem list.  Patient reports  got poked in the eye last night, now having blurred vision and sensitivity to light. Waiting at urgent care currently .   .  .   . Denies any leaking of fluid.   The following portions of the patient's history were reviewed and updated as appropriate: allergies, current medications, past family history, past medical history, past social history, past surgical history and problem list.   Objective:  There were no vitals filed for this visit.  Fetal Status:           General:  Alert, oriented and cooperative.  Patient is in no acute distress.  Respiratory: Normal respiratory effort, no problems with respiration noted  Mental Status: Normal mood and affect. Normal behavior. Normal judgment and thought content.  Rest of physical exam deferred due to type of encounter  Imaging: [place] MFM OB DETAIL +14 WK  Result Date: 05/10/2023 ----------------------------------------------------------------------  OBSTETRICS REPORT                        (Signed Final 05/10/2023 09:38 am) ---------------------------------------------------------------------- Patient Info  ID #:       [id number]                          D.O.B.:  [birth year] (27 yrs)  Name:       Shannon Ruiz                      Visit Date: 05/10/2023 07:42 am ---------------------------------------------------------------------- Performed By  Attending:        Lin Landsman      Ref. Address:      [address]                    MD                                                              [place], [place]  [address]  Performed By:     Kris Hartmann,      [facility]  Referred By:      Levie Heritage                    MD ---------------------------------------------------------------------- Orders  #  Description                           Code        Ordered By  1  [place] MFM OB DETAIL +14 WK               76811.01    Shannon Ruiz ----------------------------------------------------------------------  #  Order #                     Accession #                Episode #  1  [id number]                   [id number]                 [id number] ---------------------------------------------------------------------- Indications  [redacted] weeks gestation of pregnancy                 Z3A.26   Encounter for antenatal screening for           Z36.3  malformations  Obesity complicating pregnancy, second          O99.212  trimester (BMI 39  Poor obstetric history: Previous fetal growth   O09.299  restriction (FGR)  Short interval between pregancies, 2nd          O09.892  trimester  Poor obstetric history: Previous                O09.299  preeclampsia / eclampsia/gestational HTN  LR NIPS/AFP negative/Horizon negative ---------------------------------------------------------------------- Fetal Evaluation  Num Of Fetuses:          1  Fetal Heart Rate(bpm):   150  Cardiac Activity:        Observed  Presentation:            Breech  Placenta:                Posterior  P. Cord Insertion:  Visualized  Amniotic Fluid  AFI FV:      Within normal limits  AFI Sum(cm)     %Tile       Largest Pocket(cm)  16.59           61          4.97  RUQ(cm)       RLQ(cm)       LUQ(cm)        LLQ(cm)  3.28          4.95          4.97           3.39 ---------------------------------------------------------------------- Biometry  BPD:        66  mm     G. Age:  26w 4d         31  %    CI:          70.5  %    70 - 86                                                          FL/HC:       19.2  %    18.6 - 20.4  HC:      250.6  mm     G. Age:  27w 1d         34  %    HC/AC:       1.12       1.05 - 1.21  AC:      223.5  mm     G. Age:  26w 5d         39  %    FL/BPD:      72.9  %    71 - 87  FL:       48.1  mm     G. Age:  26w 1d         16  %    FL/AC:       21.5  %    20 - 24  HUM:        44  mm     G. Age:  26w 1d         31  %  CER:      31.3  mm     G. Age:  27w 1d         72  %  LV:        5.1  mm  CM:        4.5  mm  Est. FW:     954   gm     2 lb 2 oz     27  % ---------------------------------------------------------------------- OB History  Gravidity:    4         Term:   2        Prem:   0        SAB:   1  TOP:          0       Ectopic:  0        Living: 2 ----------------------------------------------------------------------  Gestational Age  LMP:           26w 6d        Date:  11/03/22                  EDD:   08/10/23  U/S Today:     26w 5d                                        EDD:   08/11/23  Best:          26w 6d     Det. By:  LMP  (11/03/22)          EDD:   08/10/23 ---------------------------------------------------------------------- Anatomy  Cranium:               Appears normal         LVOT:                   Appears normal  Cavum:                 Appears normal         Aortic Arch:            Not well visualized  Ventricles:            Appears normal         Ductal Arch:            Appears normal  Choroid Plexus:        Appears normal         Diaphragm:              Appears normal  Cerebellum:            Appears normal         Stomach:                Appears normal, left                                                                        sided  Posterior Fossa:       Appears normal         Abdomen:                Appears normal  Nuchal Fold:           Not applicable (>20    Abdominal Wall:         Appears nml (cord                         wks GA)                                        insert, abd wall)  Face:                  Appears normal         Cord Vessels:           Appears normal (3                         (orbits and profile)  vessel cord)  Lips:                  Appears normal         Kidneys:                Appear normal  Palate:                Not well visualized    Bladder:                Appears normal  Thoracic:              Appears normal         Spine:                  Limited views  Heart:                 Appears normal         Upper Extremities:      Appears normal                         (4CH, axis, and                         situs)  RVOT:                  Not well visualized    Lower Extremities:      Appears normal  Other:  IVC/SVC visualized. 3VV, 3VTV not well visualized. Hands and feet          visualized. Heels visualized. Nasal bone, lenses, falx visualized.          Mandible,  maxilla not well visualized. Female gender. ---------------------------------------------------------------------- Cervix Uterus Adnexa  Cervix  Normal appearance by transabdominal scan  Uterus  Single fibroid noted, see table below.  Right Ovary  Within normal limits.  Left Ovary  Within normal limits.  Cul De Sac  No free fluid seen.  Adnexa  No abnormality visualized ---------------------------------------------------------------------- Myomas  Site                     L(cm)      W(cm)       D(cm)      Location  Anterior                 3.63       3.75        2.94 ----------------------------------------------------------------------  Blood Flow                  RI       PI       Comments ---------------------------------------------------------------------- Impression  Single intrauterine pregnancy here for a detailed anatomy  due elevated BMI and history of FGR.  Normal anatomy with measurements consistent with dates  There is good fetal movement and amniotic fluid volume  Suboptimal views of the fetal anatomy were obtained  secondary to fetal position and maternal habitus ---------------------------------------------------------------------- Recommendations  Follow up growth and anatomy in 4 weeks.  Repeat growth at 32 adn 36 weeks. ----------------------------------------------------------------------              Lin Landsman, MD Electronically Signed Final Report   05/10/2023 09:38 am ----------------------------------------------------------------------   Assessment and Plan:  Pregnancy: Z6X0960 at [redacted]w[redacted]d 1. Supervision of other high risk pregnancy, antepartum, unspecified trimester  2. History of gestational hypertension BP controlled  3. Gestational  diabetes mellitus (GDM) in third trimester, gestational diabetes method of control unspecified Fasting elevated on GTT (but had a sip of something that morning). Has started checking CBGs - PP normal, but "fastings" are 95-99. However, never truly  fasting as she will often eat/drink something before checking CBG in AM. She will start checking a true fasting.  4. History of IUGR (intrauterine growth retardation) and stillbirth, currently pregnant, unspecified trimester [place] next week already scheduled  5. Short interval between pregnancies affecting pregnancy, antepartum  6. Obesity in pregnancy, antepartum  7. History of postpartum hemorrhage Recommend aggressive management of third stage of labor  Preterm labor symptoms and general obstetric precautions including but not limited to vaginal bleeding, contractions, leaking of fluid and fetal movement were reviewed in detail with the patient. I discussed the assessment and treatment plan with the patient. The patient was provided an opportunity to ask questions and all were answered. The patient agreed with the plan and demonstrated an understanding of the instructions. The patient was advised to call back or seek an in-person office evaluation/go to MAU at [facility] for any urgent or concerning symptoms. Please refer to After Visit Summary for other counseling recommendations.   I provided 11 minutes of face-to-face time during this encounter.  No follow-ups on file.  Future Appointments  Date Time Provider Department Center  06/07/2023 10:30 AM [facility] NURSE [facility]  06/07/2023 10:45 AM [facility] US4 [facility]  06/15/2023 11:15 AM Milas Hock, MD [facility] None    Levie Heritage, DO Center for [employer], [facility]

## 2023-06-07 ENCOUNTER — Ambulatory Visit: Payer: Medicaid Other | Attending: Maternal & Fetal Medicine

## 2023-06-07 ENCOUNTER — Other Ambulatory Visit: Payer: Self-pay | Admitting: *Deleted

## 2023-06-07 ENCOUNTER — Ambulatory Visit: Payer: Medicaid Other | Admitting: *Deleted

## 2023-06-07 VITALS — BP 123/76 | HR 86

## 2023-06-07 DIAGNOSIS — E669 Obesity, unspecified: Secondary | ICD-10-CM | POA: Diagnosis not present

## 2023-06-07 DIAGNOSIS — O09899 Supervision of other high risk pregnancies, unspecified trimester: Secondary | ICD-10-CM | POA: Diagnosis present

## 2023-06-07 DIAGNOSIS — O99213 Obesity complicating pregnancy, third trimester: Secondary | ICD-10-CM

## 2023-06-07 DIAGNOSIS — O2340 Unspecified infection of urinary tract in pregnancy, unspecified trimester: Secondary | ICD-10-CM

## 2023-06-07 DIAGNOSIS — O09299 Supervision of pregnancy with other poor reproductive or obstetric history, unspecified trimester: Secondary | ICD-10-CM

## 2023-06-07 DIAGNOSIS — O99212 Obesity complicating pregnancy, second trimester: Secondary | ICD-10-CM | POA: Diagnosis present

## 2023-06-07 DIAGNOSIS — O09293 Supervision of pregnancy with other poor reproductive or obstetric history, third trimester: Secondary | ICD-10-CM

## 2023-06-07 DIAGNOSIS — O24419 Gestational diabetes mellitus in pregnancy, unspecified control: Secondary | ICD-10-CM | POA: Diagnosis not present

## 2023-06-07 DIAGNOSIS — Z362 Encounter for other antenatal screening follow-up: Secondary | ICD-10-CM | POA: Insufficient documentation

## 2023-06-07 DIAGNOSIS — Z8759 Personal history of other complications of pregnancy, childbirth and the puerperium: Secondary | ICD-10-CM

## 2023-06-07 DIAGNOSIS — Z3A30 30 weeks gestation of pregnancy: Secondary | ICD-10-CM

## 2023-06-08 ENCOUNTER — Other Ambulatory Visit (HOSPITAL_COMMUNITY): Payer: Self-pay

## 2023-06-15 ENCOUNTER — Ambulatory Visit (INDEPENDENT_AMBULATORY_CARE_PROVIDER_SITE_OTHER): Payer: Medicaid Other | Admitting: Obstetrics and Gynecology

## 2023-06-15 ENCOUNTER — Encounter: Payer: Self-pay | Admitting: Obstetrics and Gynecology

## 2023-06-15 DIAGNOSIS — O09899 Supervision of other high risk pregnancies, unspecified trimester: Secondary | ICD-10-CM

## 2023-06-15 DIAGNOSIS — O99210 Obesity complicating pregnancy, unspecified trimester: Secondary | ICD-10-CM

## 2023-06-15 DIAGNOSIS — O09299 Supervision of pregnancy with other poor reproductive or obstetric history, unspecified trimester: Secondary | ICD-10-CM

## 2023-06-15 DIAGNOSIS — O2340 Unspecified infection of urinary tract in pregnancy, unspecified trimester: Secondary | ICD-10-CM

## 2023-06-15 DIAGNOSIS — O99340 Other mental disorders complicating pregnancy, unspecified trimester: Secondary | ICD-10-CM

## 2023-06-15 DIAGNOSIS — O24410 Gestational diabetes mellitus in pregnancy, diet controlled: Secondary | ICD-10-CM

## 2023-06-15 DIAGNOSIS — Z8759 Personal history of other complications of pregnancy, childbirth and the puerperium: Secondary | ICD-10-CM

## 2023-06-15 DIAGNOSIS — Z87891 Personal history of nicotine dependence: Secondary | ICD-10-CM

## 2023-06-15 DIAGNOSIS — F419 Anxiety disorder, unspecified: Secondary | ICD-10-CM

## 2023-06-15 NOTE — Progress Notes (Signed)
Patient called and has flat tire and unable to get to office at appointment time- switched to virtual visit. [name] RN

## 2023-06-15 NOTE — Progress Notes (Signed)
OBSTETRICS PRENATAL VIRTUAL VISIT ENCOUNTER NOTE  Provider location: Center for [facility] at [facility]   Patient location: Home  I connected with Shannon Ruiz on 06/15/23 at 11:15 AM EDT by MyChart Video Encounter and verified that I am speaking with the correct person using two identifiers. I discussed the limitations, risks, security and privacy concerns of performing an evaluation and management service virtually and the availability of in person appointments. I also discussed with the patient that there may be a patient responsible charge related to this service. The patient expressed understanding and agreed to proceed. Subjective:  Shannon Ruiz is a 27 y.o. Z6X0960 at [redacted]w[redacted]d being seen today for ongoing prenatal care.  She is currently monitored for the following issues for this high-risk pregnancy and has Former smoker; Anxiety disorder affecting pregnancy, antepartum; Supervision of other high risk pregnancy, antepartum, unspecified trimester; Obesity in pregnancy, antepartum; History of gestational hypertension; Short interval between pregnancies affecting pregnancy, antepartum; History of postpartum hemorrhage; Urinary tract infection affecting pregnancy; History of IUGR (intrauterine growth retardation) and stillbirth, currently pregnant, unspecified trimester; and Gestational diabetes mellitus (GDM) in third trimester on their problem list.  Patient reports  possible loss of mucous plug. No other symptoms.  .   Shannon Ruiz. Bleeding: None.   . Denies any leaking of fluid.   The following portions of the patient's history were reviewed and updated as appropriate: allergies, current medications, past family history, past medical history, past social history, past surgical history and problem list.   Objective:  There were no vitals filed for this visit.  Fetal Status:           General:  Alert, oriented and cooperative. Patient is in no acute distress.  Respiratory: Normal  respiratory effort, no problems with respiration noted  Mental Status: Normal mood and affect. Normal behavior. Normal judgment and thought content.  Rest of physical exam deferred due to type of encounter  Imaging: [place] MFM OB FOLLOW UP  Result Date: 06/07/2023 ----------------------------------------------------------------------  OBSTETRICS REPORT                       (Signed Final 06/07/2023 12:08 pm) ---------------------------------------------------------------------- Patient Info  ID #:       [id number]                          D.O.B.:  [birth year] (27 yrs)  Name:       Shannon Ruiz                      Visit Date: 06/07/2023 11:49 am ---------------------------------------------------------------------- Performed By  Attending:        Noralee Space MD        Ref. Address:     [address]                                                             [place], [place]  [address]  Performed By:     Palma Holter         [facility]  Referred By:      Levie Heritage                    MD ---------------------------------------------------------------------- Orders  #  Description                           Code        Ordered By  1  [place] MFM OB FOLLOW UP                   60454.09    Lin Landsman ----------------------------------------------------------------------  #  Order #                     Accession #                Episode #  1  [id number]                   [id number]                 [id number] ---------------------------------------------------------------------- Indications  Obesity complicating pregnancy, third          O99.213  trimester  Poor obstetric  history: Previous fetal growth  O09.299  restriction (FGR)  Short interval between pregancies, 3rd         O09.893  trimester  Poor obstetric history: Previous               O09.299  preeclampsia / eclampsia/gestational HTN  Antenatal follow-up for nonvisualized fetal    Z36.2  anatomy  [redacted] weeks gestation of pregnancy                Z3A.30  LR NIPS/AFP negative/Horizon negative  Gestational diabetes in pregnancy,             O24.419  unspecified control ---------------------------------------------------------------------- Fetal Evaluation  Num Of Fetuses:  1  Fetal Heart Rate(bpm):  145  Cardiac Activity:       Observed  Presentation:           Breech  Placenta:               Posterior  P. Cord Insertion:      Previously visualized  Amniotic Fluid  AFI FV:      Within normal limits  AFI Sum(cm)     %Tile       Largest Pocket(cm)  16.81           62          5.07  RUQ(cm)       RLQ(cm)       LUQ(cm)        LLQ(cm)  5.07          4.45          3.01           4.28 ---------------------------------------------------------------------- Biometry  BPD:      76.3  mm     G. Age:  30w 4d         31  %    CI:        70.17   %    70 - 86                                                          FL/HC:      20.1   %    19.3 - 21.3  HC:      290.5  mm     G. Age:  32w 0d         45  %    HC/AC:      1.05        0.96 - 1.17  AC:      275.4  mm     G. Age:  31w 4d         69  %    FL/BPD:     76.7   %    71 - 87  FL:       58.5  mm     G. Age:  30w 4d         28  %    FL/AC:      21.2   %    20 - 24  HUM:      48.8  mm     G. Age:  28w 5d          6  %  LV:        5.8  mm  Est. FW:    1727  gm    3 lb 13 oz      51  % ---------------------------------------------------------------------- OB History  Gravidity:    4         Term:   2        Prem:   0        SAB:   1  TOP:          0       Ectopic:  0        Living: 2 ---------------------------------------------------------------------- Gestational Age  LMP:           30w 6d  Date:  11/03/22                  EDD:   08/10/23  U/S Today:     31w 1d                                        EDD:   08/08/23  Best:          30w 6d     Det. By:  LMP  (11/03/22)          EDD:   08/10/23 ---------------------------------------------------------------------- Anatomy  Cranium:               Appears normal         LVOT:                   Appears normal  Cavum:                 Appears normal         Aortic Arch:            Not well visualized  Ventricles:            Appears normal         Ductal Arch:            Previously seen  Choroid Plexus:        Previously seen        Diaphragm:              Appears normal  Cerebellum:            Previously seen        Stomach:                Appears normal, left                                                                        sided  Posterior Fossa:       Previously seen        Abdomen:                Appears normal  Nuchal Fold:           Not applicable (>20    Abdominal Wall:         Previously seen                         wks GA)  Face:                  Appears normal         Cord Vessels:           Appears normal (3                         (orbits and profile)                           vessel cord)  Lips:  Previously seen        Kidneys:                Appear normal  Palate:                Not well visualized    Bladder:                Appears normal  Thoracic:              Appears normal         Spine:                  Ltd views no                                                                        intracranial signs of                                                                        NTD  Heart:                 Appears normal         Upper Extremities:      Previously seen                         (4CH, axis, and                         situs)  RVOT:                  Appears normal         Lower Extremities:      Previously seen  Other:  Hands and feet/heels previously visualized. VC, 3VV and 3VTV          visualized. Nasal  bone, lenses and falx previously visualized.  Maxilla          and mandible visualized. Fetus appears to be a female. ---------------------------------------------------------------------- Cervix Uterus Adnexa  Cervix  Length:            4.2  cm.  Normal appearance by transabdominal scan  Uterus  No abnormality visualized.  Right Ovary  Not visualized.  Left Ovary  Not visualized.  Cul De Sac  No free fluid seen.  Adnexa  No abnormality visualized ---------------------------------------------------------------------- Impression  Fetal growth is appropriate for gestational age.  Amniotic fluid  is normal and good fetal activity seen.  Fetal anatomical  survey appears normal but limited by advanced gestational  age.  She has a new diagnosis of gestational diabetes.  Patient is  not sure about the diagnosis and she had sugary drink before  the test.  She is checking her blood glucose regularly.  From  her prenatal chart, I note that her fasting levels ranged  between 95 and 99 mg/dL and postprandial levels are normal.  I encouraged her to check her blood  glucose regularly. ---------------------------------------------------------------------- Recommendations  -An appointment was made for her to return in 4 weeks for  fetal growth assessment.  -If patient requires insulin or metformin, we recommend BPP  from her next visit till delivery. ----------------------------------------------------------------------                 Noralee Space, MD Electronically Signed Final Report   06/07/2023 12:08 pm ----------------------------------------------------------------------   Assessment and Plan:  Pregnancy: [id number] at [redacted]w[redacted]d 1. Diet controlled gestational diabetes mellitus (GDM) in third trimester Current regimen: diet CBG review: Does have true fasting because drinks overnight, but 94-99. Meals are less than 120.  Regimen changes: none Growth [place]: 6/12 was wnl - 51%ile, normal ac and afi.   2. Urinary tract infection  affecting pregnancy TOC neg  3. Supervision of other high risk pregnancy, antepartum, unspecified trimester Discussed BC: Considering Nexplanon vs ppIUD (Paragard) Discussed circ: yes Reviewed s/sx of PTL.   4. Short interval between pregnancies affecting pregnancy, antepartum  5. Obesity in pregnancy, antepartum  6. History of postpartum hemorrhage HgB normal on last check - 11.4.   7. History of IUGR (intrauterine growth retardation) and stillbirth, currently pregnant, unspecified trimester Serial growth until delivery  8. History of gestational hypertension BP has normal thus far.   9. Cigarette smoker Quit years ago.   10. Anxiety disorder affecting pregnancy, antepartum None  Preterm labor symptoms and general obstetric precautions including but not limited to vaginal bleeding, contractions, leaking of fluid and fetal movement were reviewed in detail with the patient. I discussed the assessment and treatment plan with the patient. The patient was provided an opportunity to ask questions and all were answered. The patient agreed with the plan and demonstrated an understanding of the instructions. The patient was advised to call back or seek an in-person office evaluation/go to MAU at [facility] for any urgent or concerning symptoms. Please refer to After Visit Summary for other counseling recommendations.   I provided 8 minutes of face-to-face time during this encounter.  No follow-ups on file.  Future Appointments  Date Time Provider Department Center  07/06/2023 11:15 AM [facility] NURSE [facility]  07/06/2023 11:30 AM [facility] US2 [facility]    Milas Hock, MD Center for [facility], [facility]

## 2023-06-26 ENCOUNTER — Encounter (HOSPITAL_COMMUNITY): Payer: Self-pay | Admitting: Obstetrics & Gynecology

## 2023-06-26 ENCOUNTER — Other Ambulatory Visit: Payer: Self-pay

## 2023-06-26 ENCOUNTER — Inpatient Hospital Stay (HOSPITAL_COMMUNITY)
Admission: AD | Admit: 2023-06-26 | Discharge: 2023-06-27 | Disposition: A | Discharge status: Home / Self Care | Payer: Medicaid Other | Attending: Obstetrics & Gynecology | Admitting: Obstetrics & Gynecology

## 2023-06-26 DIAGNOSIS — Z3A33 33 weeks gestation of pregnancy: Secondary | ICD-10-CM | POA: Diagnosis not present

## 2023-06-26 DIAGNOSIS — R030 Elevated blood-pressure reading, without diagnosis of hypertension: Secondary | ICD-10-CM | POA: Diagnosis not present

## 2023-06-26 DIAGNOSIS — Z3689 Encounter for other specified antenatal screening: Secondary | ICD-10-CM

## 2023-06-26 DIAGNOSIS — O26893 Other specified pregnancy related conditions, third trimester: Secondary | ICD-10-CM | POA: Diagnosis present

## 2023-06-26 DIAGNOSIS — R110 Nausea: Secondary | ICD-10-CM | POA: Insufficient documentation

## 2023-06-26 DIAGNOSIS — E876 Hypokalemia: Secondary | ICD-10-CM

## 2023-06-26 DIAGNOSIS — O219 Vomiting of pregnancy, unspecified: Secondary | ICD-10-CM

## 2023-06-26 LAB — URINALYSIS, ROUTINE W REFLEX MICROSCOPIC
Bilirubin Urine: NEGATIVE
Glucose, UA: NEGATIVE mg/dL
Hgb urine dipstick: NEGATIVE
Ketones, ur: 5 mg/dL — AB
Nitrite: NEGATIVE
Protein, ur: 30 mg/dL — AB
Specific Gravity, Urine: 1.016 (ref 1.005–1.030)
pH: 6 (ref 5.0–8.0)

## 2023-06-26 MED ORDER — ONDANSETRON 4 MG PO TBDP: 4.0000 mg | ORAL_TABLET | Freq: Four times a day (QID) | ORAL | Status: DC | PRN

## 2023-06-26 MED ORDER — ONDANSETRON 4 MG PO TBDP
4.0000 mg | ORAL_TABLET | Freq: Once | ORAL | Status: AC
  Administered 2023-06-27: 4 mg via ORAL
  Filled 2023-06-26: qty 1

## 2023-06-26 NOTE — MAU Note (Signed)
.  Shannon Ruiz is a 27 y.o. at [redacted]w[redacted]d here in MAU reporting: Pt reports headache and nausea on Saturday and then on Sunday she felt dizzy. Pt reports still feeling dizzy and nauseas today and took her blood pressure states it was 147/102. Currently not dizzy and does not have a headache, just feel nauseated. Onset of complaint: Saturday  Pain score: denies  There were no vitals filed for this visit.    Lab orders placed from triage:   ua

## 2023-06-26 NOTE — MAU Provider Note (Signed)
History     CSN: [id number]  Arrival date and time: 06/26/23 2235   Event Date/Time   First Provider Initiated Contact with Patient 06/26/23 2327      Chief Complaint  Patient presents with   Nausea    [name] is a 27 y.o. [id number] at [redacted]w[redacted]d who Shannon Ruiz.  Shannon Ruiz presents today for high blood pressure.  Shannon Ruiz reports Shannon Ruiz had an elevated blood pressure around 8 or 9pm.  Shannon Ruiz states Shannon Ruiz took her blood pressure because Shannon Ruiz hasn't been feeling well since Saturday.  Shannon Ruiz states Shannon Ruiz was at her father-in-laws and when Shannon Ruiz saw the machine Shannon Ruiz just took it.  Shannon Ruiz denies current HA or dizziness.  Shannon Ruiz does report some nausea, but reports that that is "a normal thing for me." Shannon Ruiz endorses fetal movement and denies vaginal concerns including bleeding, leaking, and abnormal discharge.    OB History     Gravida  5   Para  2   Term  2   Preterm      AB  2   Living  2      SAB  2   IAB      Ectopic      Multiple  0   Live Births  2           Past Medical History:  Diagnosis Date   Anemia    Anxiety    Depression    Supervision of other high risk pregnancy, antepartum, unspecified trimester 01/09/2021   Needs PP MMR    Past Surgical History:  Procedure Laterality Date   NO PAST SURGERIES      Family History  Problem Relation Age of Onset   Anxiety disorder Mother    Hypertension Mother    Heart Problems Mother    Diabetes Father     Social History   Tobacco Use   Smoking status: Former    Types: Cigarettes   Smokeless tobacco: Never  Vaping Use   Vaping Use: Former  Substance Use Topics   Alcohol use: Never   Drug use: Never    Allergies: No Known Allergies  Medications Prior to Admission  Medication Sig Dispense Refill Last Dose   aspirin EC 81 MG tablet Take 1 tablet (81 mg total) by mouth daily. Take daily for rest of pregnancy for prevention of preeclampsia (Patient not taking: Reported on 05/10/2023) 300 tablet 2    Blood Glucose  Monitoring Suppl DEVI 1 each by Does not apply route in the morning, at noon, and at bedtime. May substitute to any manufacturer covered by patient's insurance. 1 each 0    citalopram (CELEXA) 10 MG tablet Take 1 tablet (10 mg total) by mouth daily. Increase to 20mg  in 2 weeks if tolerating 60 tablet 3    Clindamycin-Benzoyl Per, Refr, gel Apply 1 Application topically 2 (two) times daily. 45 g 3    erythromycin ophthalmic ointment Place a 1/2 inch ribbon of ointment into the lower eyelid 4 times daily. 3.5 g 0    Prenatal Vit-Fe Fumarate-FA (PRENATAL VITAMINS PO) Take by mouth.       Review of Systems  Gastrointestinal:  Positive for nausea. Negative for abdominal pain and vomiting.  Genitourinary:  Negative for difficulty urinating, dysuria, vaginal bleeding and vaginal discharge.   Physical Exam   Blood pressure 118/81, pulse 90, temperature 98.9 F (37.2 C), resp. rate 16, last menstrual period 11/03/2022, SpO2 97 %, unknown if currently breastfeeding. Vitals:   06/26/23  2251 06/26/23 2301 06/26/23 2316  BP: 135/86 122/79 118/81  Pulse: 90 87 90  Resp: 16    Temp: 98.9 F (37.2 C)    SpO2: 97%      Physical Exam Vitals reviewed.  Constitutional:      Appearance: Normal appearance.  HENT:     Head: Normocephalic and atraumatic.  Eyes:     Conjunctiva/sclera: Conjunctivae normal.  Cardiovascular:     Rate and Rhythm: Normal rate.  Pulmonary:     Effort: Pulmonary effort is normal. No respiratory distress.  Abdominal:     General: Bowel sounds are normal.     Comments: Gravid--fundal height appears AGA, Soft, NT   Musculoskeletal:        General: Normal range of motion.     Cervical back: Normal range of motion.  Skin:    General: Skin is warm and dry.  Neurological:     Mental Status: Shannon Ruiz is alert and oriented to person, place, and time.  Psychiatric:        Mood and Affect: Mood normal.        Behavior: Behavior normal.     Fetal Assessment 135 bpm, Mod Var,  -Decels, +15x15 Accels Toco: Irregular   MAU Course   Results for orders placed or performed during the hospital encounter of 06/26/23 (from the past 24 hour(s))  Urinalysis, Routine w reflex microscopic -Urine, Clean Catch     Status: Abnormal   Collection Time: 06/26/23 10:39 PM  Result Value Ref Range   Color, Urine YELLOW YELLOW   APPearance HAZY (A) CLEAR   Specific Gravity, Urine 1.016 1.005 - 1.030   pH 6.0 5.0 - 8.0   Glucose, UA NEGATIVE NEGATIVE mg/dL   Hgb urine dipstick NEGATIVE NEGATIVE   Bilirubin Urine NEGATIVE NEGATIVE   Ketones, ur 5 (A) NEGATIVE mg/dL   Protein, ur 30 (A) NEGATIVE mg/dL   Nitrite NEGATIVE NEGATIVE   Leukocytes,Ua TRACE (A) NEGATIVE   RBC / HPF 0-5 0 - 5 RBC/hpf   WBC, UA 6-10 0 - 5 WBC/hpf   Bacteria, UA FEW (A) NONE SEEN   Squamous Epithelial / HPF 6-10 0 - 5 /HPF   Mucus PRESENT   Protein / creatinine ratio, urine     Status: Abnormal   Collection Time: 06/26/23 10:39 PM  Result Value Ref Range   Creatinine, Urine 197 mg/dL   Total Protein, Urine 35 mg/dL   Protein Creatinine Ratio 0.18 (H) 0.00 - 0.15 mg/mg[Cre]  CBC     Status: Abnormal   Collection Time: 06/27/23 12:14 AM  Result Value Ref Range   WBC 14.8 (H) 4.0 - 10.5 K/uL   RBC 3.99 3.87 - 5.11 MIL/uL   Hemoglobin 11.0 (L) 12.0 - 15.0 g/dL   HCT 16.1 (L) 09.6 - 04.5 %   MCV 81.5 80.0 - 100.0 fL   MCH 27.6 26.0 - 34.0 pg   MCHC 33.8 30.0 - 36.0 g/dL   RDW 40.9 81.1 - 91.4 %   Platelets 350 150 - 400 K/uL   nRBC 0.0 0.0 - 0.2 %  Comprehensive metabolic panel     Status: Abnormal   Collection Time: 06/27/23 12:14 AM  Result Value Ref Range   Sodium 138 135 - 145 mmol/L   Potassium 2.6 (LL) 3.5 - 5.1 mmol/L   Chloride 104 98 - 111 mmol/L   CO2 20 (L) 22 - 32 mmol/L   Glucose, Bld 93 70 - 99 mg/dL   BUN <5 (L) 6 -  20 mg/dL   Creatinine, Ser 4.09 0.44 - 1.00 mg/dL   Calcium 9.0 8.9 - 81.1 mg/dL   Total Protein 6.4 (L) 6.5 - 8.1 g/dL   Albumin 2.6 (L) 3.5 - 5.0 g/dL   AST  12 (L) 15 - 41 U/L   ALT 10 0 - 44 U/L   Alkaline Phosphatase 80 38 - 126 U/L   Total Bilirubin <0.1 (L) 0.3 - 1.2 mg/dL   GFR, Estimated >91 >47 mL/min   Anion gap 14 5 - 15   No results found.  MDM Physical Exam Labs: CBC, CMP, PC Ratio Measure BPQ15 min EFM KCL Infusion x 2 Prescription Assessment and Plan  27 year old [id number]  SIUP at 33.4 weeks Cat I FT Elevated BP at home Nausea   -Chart reviewed by U. Anyanwu who recommends and orders labs. -Provider to bedside to discuss POC.  -Exam performed.  -Patient offered and declines nausea medication. -NST reactive. -Monitor and reassess.   Cherre Robins MSN, CNM 06/26/2023, 11:28 PM   Reassessment (12:55 AM) Vitals:   06/26/23 2331 06/26/23 2346 06/27/23 0155 06/27/23 0200  BP: 127/84 129/85 117/84   Pulse: 82 88 75   Resp:      Temp:      SpO2:   99% 99%    -Provider to bedside to discuss results. -BP normotensive.  -Informed of hypokalemia and recommendation for IV potassium. -After consideration patient desires to proceed with infusions. -Plan to give 2 runs of KCL and discharge with oral dosing.  -Okay to discontinue blood pressures and monitoring.   Reassessment (3:57 AM) -Fluids complete. -Nurse to inform patient to take oral potassium for next 2 weeks, but notify provider if vomiting persists or if diarrhea becomes an issue as additional treatment may be necessary. -Precautions to be reviewed. -Discharged to home in stable condition.  Cherre Robins MSN, CNM Advanced Practice Provider, [facility]

## 2023-06-27 DIAGNOSIS — O219 Vomiting of pregnancy, unspecified: Secondary | ICD-10-CM

## 2023-06-27 DIAGNOSIS — Z3A33 33 weeks gestation of pregnancy: Secondary | ICD-10-CM

## 2023-06-27 LAB — COMPREHENSIVE METABOLIC PANEL
ALT: 10 U/L (ref 0–44)
AST: 12 U/L — ABNORMAL LOW (ref 15–41)
Albumin: 2.6 g/dL — ABNORMAL LOW (ref 3.5–5.0)
Alkaline Phosphatase: 80 U/L (ref 38–126)
Anion gap: 14 (ref 5–15)
BUN: <5 mg/dL — ABNORMAL LOW (ref 6–20)
CO2: 20 mmol/L — ABNORMAL LOW (ref 22–32)
Calcium: 9 mg/dL (ref 8.9–10.3)
Chloride: 104 mmol/L (ref 98–111)
Creatinine, Ser: 0.54 mg/dL (ref 0.44–1.00)
GFR, Estimated: >60 mL/min (ref >60)
Glucose, Bld: 93 mg/dL (ref 70–99)
Potassium: 2.6 mmol/L — CL (ref 3.5–5.1)
Sodium: 138 mmol/L (ref 135–145)
Total Bilirubin: <0.1 mg/dL — ABNORMAL LOW (ref 0.3–1.2)
Total Protein: 6.4 g/dL — ABNORMAL LOW (ref 6.5–8.1)

## 2023-06-27 LAB — CBC
HCT: 32.5 % — ABNORMAL LOW (ref 36.0–46.0)
Hemoglobin: 11 g/dL — ABNORMAL LOW (ref 12.0–15.0)
MCH: 27.6 pg (ref 26.0–34.0)
MCHC: 33.8 g/dL (ref 30.0–36.0)
MCV: 81.5 fL (ref 80.0–100.0)
Platelets: 350 10*3/uL (ref 150–400)
RBC: 3.99 MIL/uL (ref 3.87–5.11)
RDW: 14 % (ref 11.5–15.5)
WBC: 14.8 10*3/uL — ABNORMAL HIGH (ref 4.0–10.5)
nRBC: 0 % (ref 0.0–0.2)

## 2023-06-27 LAB — PROTEIN / CREATININE RATIO, URINE
Creatinine, Urine: 197 mg/dL
Protein Creatinine Ratio: 0.18 mg/mg{Cre} — ABNORMAL HIGH (ref 0.00–0.15)
Total Protein, Urine: 35 mg/dL

## 2023-06-27 MED ORDER — POTASSIUM CHLORIDE 10 MEQ/100ML IV SOLN
10.0000 meq | INTRAVENOUS | Status: AC
  Administered 2023-06-27 (×2): 10 meq via INTRAVENOUS
  Filled 2023-06-27 (×2): qty 100

## 2023-06-27 MED ORDER — POTASSIUM CHLORIDE ER 10 MEQ PO TBCR: 10.0000 meq | EXTENDED_RELEASE_TABLET | Freq: Every day | ORAL | 0 refills | Status: DC

## 2023-06-27 MED ORDER — LACTATED RINGERS IV SOLN
INTRAVENOUS | Status: DC
  Administered 2023-06-27: 1000 mL via INTRAVENOUS

## 2023-06-27 MED ORDER — LACTATED RINGERS IV BOLUS
1000.0000 mL | Freq: Once | INTRAVENOUS | Status: AC
  Administered 2023-06-27: 1000 mL via INTRAVENOUS

## 2023-06-27 NOTE — MAU Note (Signed)
CRITICAL VALUE STICKER  CRITICAL VALUE: potassium 2.6  RECEIVER (on-site recipient of call): Ramonita Koenig, RN   DATE & TIME NOTIFIED: 06/27/2023 0047  MESSENGER (representative from lab): Amalia Hailey   MD NOTIFIED: Shanda Bumps, CNM   TIME OF NOTIFICATION: 06/27/2023 0047  RESPONSE:  no new orders at this time

## 2023-06-28 ENCOUNTER — Encounter: Payer: Medicaid Other | Admitting: Family Medicine

## 2023-07-03 ENCOUNTER — Encounter: Payer: Self-pay | Admitting: Obstetrics & Gynecology

## 2023-07-03 ENCOUNTER — Ambulatory Visit (INDEPENDENT_AMBULATORY_CARE_PROVIDER_SITE_OTHER): Payer: Medicaid Other | Admitting: Obstetrics & Gynecology

## 2023-07-03 VITALS — BP 124/83 | HR 74 | Wt 225.0 lb

## 2023-07-03 DIAGNOSIS — K219 Gastro-esophageal reflux disease without esophagitis: Secondary | ICD-10-CM

## 2023-07-03 DIAGNOSIS — O09899 Supervision of other high risk pregnancies, unspecified trimester: Secondary | ICD-10-CM

## 2023-07-03 DIAGNOSIS — Z8759 Personal history of other complications of pregnancy, childbirth and the puerperium: Secondary | ICD-10-CM

## 2023-07-03 DIAGNOSIS — O99613 Diseases of the digestive system complicating pregnancy, third trimester: Secondary | ICD-10-CM

## 2023-07-03 DIAGNOSIS — O99210 Obesity complicating pregnancy, unspecified trimester: Secondary | ICD-10-CM

## 2023-07-03 DIAGNOSIS — Z3A34 34 weeks gestation of pregnancy: Secondary | ICD-10-CM

## 2023-07-03 DIAGNOSIS — E876 Hypokalemia: Secondary | ICD-10-CM

## 2023-07-03 DIAGNOSIS — O09293 Supervision of pregnancy with other poor reproductive or obstetric history, third trimester: Secondary | ICD-10-CM

## 2023-07-03 DIAGNOSIS — O24410 Gestational diabetes mellitus in pregnancy, diet controlled: Secondary | ICD-10-CM

## 2023-07-03 MED ORDER — PANTOPRAZOLE SODIUM 40 MG PO TBEC
40.0000 mg | DELAYED_RELEASE_TABLET | Freq: Every day | ORAL | 2 refills | Status: DC
Start: 2023-07-03 — End: 2023-07-27

## 2023-07-03 NOTE — Progress Notes (Signed)
PRENATAL VISIT NOTE  Subjective:  Shannon Ruiz is a 27 y.o. [id number] at [redacted]w[redacted]d being seen today for ongoing prenatal care.  She is currently monitored for the following issues for this high-risk pregnancy and has Former smoker; Anxiety disorder affecting pregnancy, antepartum; Supervision of other high risk pregnancy, antepartum, unspecified trimester; Obesity in pregnancy, antepartum; History of gestational hypertension; Short interval between pregnancies affecting pregnancy, antepartum; History of postpartum hemorrhage; Urinary tract infection affecting pregnancy; History of IUGR (intrauterine growth retardation) and stillbirth, currently pregnant, third trimester; and Gestational diabetes mellitus (GDM) in third trimester on their problem list.  Patient reports  severe heartburn not alleviated by Tums .  Was seen last week in MAU for elevated BP, noted to have K of 2.6 incidentally and was repleted. She wants to know her level now.  Contractions: Not present. Vag. Bleeding: None.  Movement: Present. Denies leaking of fluid.   The following portions of the patient's history were reviewed and updated as appropriate: allergies, current medications, past family history, past medical history, past social history, past surgical history and problem list.   Objective:   Vitals:   07/03/23 0831  BP: 124/83  Pulse: 74  Weight: 225 lb (102.1 kg)    Fetal Status: Fetal Heart Rate (bpm): 134   Movement: Present     General:  Alert, oriented and cooperative. Patient is in no acute distress.  Skin: Skin is warm and dry. No rash noted.   Cardiovascular: Normal heart rate noted  Respiratory: Normal respiratory effort, no problems with respiration noted  Abdomen: Soft, gravid, appropriate for gestational age.  Pain/Pressure: Present     Pelvic: Cervical exam deferred        Extremities: Normal range of motion.  Edema: None  Mental Status: Normal mood and affect. Normal behavior. Normal judgment and  thought content.    [place] MFM OB FOLLOW UP  Result Date: 06/07/2023 ----------------------------------------------------------------------  OBSTETRICS REPORT                       (Signed Final 06/07/2023 12:08 pm) ---------------------------------------------------------------------- Patient Info  ID #:       [id number]                          D.O.B.:  10-Jan-1996 (27 yrs)  Name:       Shannon Ruiz                      Visit Date: 06/07/2023 11:49 am ---------------------------------------------------------------------- Performed By  Attending:        Noralee Space MD        Ref. Address:     [address]                                                             [place], [place]                                                             [address]  Performed By:     Palma Holter  [facility]  Referred By:      Levie Heritage                    MD ---------------------------------------------------------------------- Orders  #  Description                           Code        Ordered By  1  [place] MFM OB FOLLOW UP                   16109.60    Lin Landsman ----------------------------------------------------------------------  #  Order #                     Accession #                Episode #  1  [id number]                   [id number]                 [id number] ---------------------------------------------------------------------- Indications  Obesity complicating pregnancy, third          O99.213  trimester  Poor obstetric history: Previous fetal growth  O09.299  restriction (FGR)  Short interval between pregancies, 3rd         O09.893  trimester  Poor obstetric history: Previous               O09.299  preeclampsia /  eclampsia/gestational HTN  Antenatal follow-up for nonvisualized fetal    Z36.2  anatomy  [redacted] weeks gestation of pregnancy                Z3A.30  LR NIPS/AFP negative/Horizon negative  Gestational diabetes in pregnancy,             O24.419  unspecified control ---------------------------------------------------------------------- Fetal Evaluation  Num Of Fetuses:         1  Fetal Heart Rate(bpm):  145  Cardiac Activity:  Observed  Presentation:           Breech  Placenta:               Posterior  P. Cord Insertion:      Previously visualized  Amniotic Fluid  AFI FV:      Within normal limits  AFI Sum(cm)     %Tile       Largest Pocket(cm)  16.81           62          5.07  RUQ(cm)       RLQ(cm)       LUQ(cm)        LLQ(cm)  5.07          4.45          3.01           4.28 ---------------------------------------------------------------------- Biometry  BPD:      76.3  mm     G. Age:  30w 4d         31  %    CI:        70.17   %    70 - 86                                                          FL/HC:      20.1   %    19.3 - 21.3  HC:      290.5  mm     G. Age:  32w 0d         45  %    HC/AC:      1.05        0.96 - 1.17  AC:      275.4  mm     G. Age:  31w 4d         69  %    FL/BPD:     76.7   %    71 - 87  FL:       58.5  mm     G. Age:  30w 4d         28  %    FL/AC:      21.2   %    20 - 24  HUM:      48.8  mm     G. Age:  28w 5d          6  %  LV:        5.8  mm  Est. FW:    1727  gm    3 lb 13 oz      51  % ---------------------------------------------------------------------- OB History  Gravidity:    4         Term:   2        Prem:   0        SAB:   1  TOP:          0       Ectopic:  0        Living: 2 ---------------------------------------------------------------------- Gestational Age  LMP:           30w 6d        Date:  11/03/22  EDD:   08/10/23  U/S Today:     31w 1d                                        EDD:   08/08/23  Best:          30w 6d     Det. By:  LMP  (11/03/22)          EDD:    08/10/23 ---------------------------------------------------------------------- Anatomy  Cranium:               Appears normal         LVOT:                   Appears normal  Cavum:                 Appears normal         Aortic Arch:            Not well visualized  Ventricles:            Appears normal         Ductal Arch:            Previously seen  Choroid Plexus:        Previously seen        Diaphragm:              Appears normal  Cerebellum:            Previously seen        Stomach:                Appears normal, left                                                                        sided  Posterior Fossa:       Previously seen        Abdomen:                Appears normal  Nuchal Fold:           Not applicable (>20    Abdominal Wall:         Previously seen                         wks GA)  Face:                  Appears normal         Cord Vessels:           Appears normal (3                         (orbits and profile)                           vessel cord)  Lips:                  Previously seen        Kidneys:  Appear normal  Palate:                Not well visualized    Bladder:                Appears normal  Thoracic:              Appears normal         Spine:                  Ltd views no                                                                        intracranial signs of                                                                        NTD  Heart:                 Appears normal         Upper Extremities:      Previously seen                         (4CH, axis, and                         situs)  RVOT:                  Appears normal         Lower Extremities:      Previously seen  Other:  Hands and feet/heels previously visualized. VC, 3VV and 3VTV          visualized. Nasal bone, lenses and falx previously visualized.  Maxilla          and mandible visualized. Fetus appears to be a female. ---------------------------------------------------------------------- Cervix  Uterus Adnexa  Cervix  Length:            4.2  cm.  Normal appearance by transabdominal scan  Uterus  No abnormality visualized.  Right Ovary  Not visualized.  Left Ovary  Not visualized.  Cul De Sac  No free fluid seen.  Adnexa  No abnormality visualized ---------------------------------------------------------------------- Impression  Fetal growth is appropriate for gestational age.  Amniotic fluid  is normal and good fetal activity seen.  Fetal anatomical  survey appears normal but limited by advanced gestational  age.  She has a new diagnosis of gestational diabetes.  Patient is  not sure about the diagnosis and she had sugary drink before  the test.  She is checking her blood glucose regularly.  From  her prenatal chart, I note that her fasting levels ranged  between 95 and 99 mg/dL and postprandial levels are normal.  I encouraged her to check her blood glucose regularly. ---------------------------------------------------------------------- Recommendations  -An appointment was made for her to return in 4 weeks for  fetal growth assessment.  -If patient requires  insulin or metformin, we recommend BPP  from her next visit till delivery. ----------------------------------------------------------------------                 Noralee Space, MD Electronically Signed Final Report   06/07/2023 12:08 pm ----------------------------------------------------------------------   Assessment and Plan:  Pregnancy: [id number] at [redacted]w[redacted]d 1. Gastroesophageal reflux during pregnancy in third trimester, antepartum Protonix prescribed. - pantoprazole (PROTONIX) 40 MG tablet; Take 1 tablet (40 mg total) by mouth daily.  Dispense: 30 tablet; Refill: 2  2. Hypokalemia Will recheck K and manage accordingly.  - Comprehensive metabolic panel  3. Diet controlled gestational diabetes mellitus (GDM) in third trimester Did not bring log, advised to bring every visit. Reports fastings in 90s, PP in 110s or below. Continue diet  control.  4. History of IUGR (intrauterine growth retardation) and stillbirth, currently pregnant, third trimester Has scan on 07/06/23.  5. Obesity in pregnancy, antepartum TWG 2 lbs, doing well.   6. History of gestational hypertension Normal BP today. Continue ASA.  7. [redacted] weeks gestation of pregnancy 8. Supervision of other high risk pregnancy, antepartum, unspecified trimester No other concerns.  Preterm labor symptoms and general obstetric precautions including but not limited to vaginal bleeding, contractions, leaking of fluid and fetal movement were reviewed in detail with the patient. Please refer to After Visit Summary for other counseling recommendations.   Return in about 2 weeks (around 07/17/2023) for Pelvic cultures, OFFICE OB VISIT (MD only).  Future Appointments  Date Time Provider Department Center  07/06/2023 11:15 AM [facility] NURSE [facility]  07/06/2023 11:30 AM [facility] US2 [facility]  07/12/2023  1:50 PM Levie Heritage, DO [facility] None  07/20/2023  2:10 PM Levie Heritage, DO [facility] None  07/27/2023 10:55 AM Laiken Nohr, Jethro Bastos, MD [facility] None    Jaynie Collins, MD

## 2023-07-03 NOTE — Patient Instructions (Signed)
Return to office for any scheduled appointments. Call the office or go to the MAU at [facility] if: You begin to have strong, frequent contractions Your water breaks.  Sometimes it is a big gush of fluid, sometimes it is just a trickle that keeps getting your underwear wet or running down your legs You have vaginal bleeding.  It is normal to have a small amount of spotting if your cervix was checked.  You do not feel your baby moving like normal.  If you do not, get something to eat and drink and lay down and focus on feeling your baby move.   If your baby is still not moving like normal, you should call the office or go to MAU. Any other obstetric concerns.  

## 2023-07-04 LAB — COMPREHENSIVE METABOLIC PANEL
ALT: 8 IU/L (ref 0–32)
AST: 11 IU/L (ref 0–40)
Albumin: 3.4 g/dL — ABNORMAL LOW (ref 4.0–5.0)
Alkaline Phosphatase: 99 IU/L (ref 44–121)
BUN/Creatinine Ratio: 11 (ref 9–23)
BUN: 6 mg/dL (ref 6–20)
Bilirubin Total: <0.2 mg/dL (ref 0.0–1.2)
CO2: 19 mmol/L — ABNORMAL LOW (ref 20–29)
Calcium: 9.5 mg/dL (ref 8.7–10.2)
Chloride: 106 mmol/L (ref 96–106)
Creatinine, Ser: 0.56 mg/dL — ABNORMAL LOW (ref 0.57–1.00)
Globulin, Total: 2.9 g/dL (ref 1.5–4.5)
Glucose: 102 mg/dL — ABNORMAL HIGH (ref 70–99)
Potassium: 3.8 mmol/L (ref 3.5–5.2)
Sodium: 142 mmol/L (ref 134–144)
Total Protein: 6.3 g/dL (ref 6.0–8.5)
eGFR: 128 mL/min/{1.73_m2} (ref >59)

## 2023-07-06 ENCOUNTER — Ambulatory Visit: Payer: Medicaid Other | Attending: Obstetrics and Gynecology

## 2023-07-06 ENCOUNTER — Ambulatory Visit: Payer: Medicaid Other | Admitting: *Deleted

## 2023-07-06 VITALS — BP 130/87 | HR 94

## 2023-07-06 DIAGNOSIS — Z8632 Personal history of gestational diabetes: Secondary | ICD-10-CM | POA: Insufficient documentation

## 2023-07-06 DIAGNOSIS — O09899 Supervision of other high risk pregnancies, unspecified trimester: Secondary | ICD-10-CM | POA: Insufficient documentation

## 2023-07-06 DIAGNOSIS — O24419 Gestational diabetes mellitus in pregnancy, unspecified control: Secondary | ICD-10-CM | POA: Diagnosis not present

## 2023-07-06 DIAGNOSIS — O09299 Supervision of pregnancy with other poor reproductive or obstetric history, unspecified trimester: Secondary | ICD-10-CM | POA: Diagnosis present

## 2023-07-06 DIAGNOSIS — O321XX0 Maternal care for breech presentation, not applicable or unspecified: Secondary | ICD-10-CM

## 2023-07-06 DIAGNOSIS — Z8759 Personal history of other complications of pregnancy, childbirth and the puerperium: Secondary | ICD-10-CM | POA: Insufficient documentation

## 2023-07-06 DIAGNOSIS — O09293 Supervision of pregnancy with other poor reproductive or obstetric history, third trimester: Secondary | ICD-10-CM | POA: Diagnosis not present

## 2023-07-06 DIAGNOSIS — Z3A35 35 weeks gestation of pregnancy: Secondary | ICD-10-CM

## 2023-07-06 DIAGNOSIS — O99213 Obesity complicating pregnancy, third trimester: Secondary | ICD-10-CM | POA: Diagnosis not present

## 2023-07-12 ENCOUNTER — Ambulatory Visit: Payer: Medicaid Other | Admitting: Family Medicine

## 2023-07-12 VITALS — BP 130/88 | HR 83 | Wt 223.0 lb

## 2023-07-12 DIAGNOSIS — O24410 Gestational diabetes mellitus in pregnancy, diet controlled: Secondary | ICD-10-CM

## 2023-07-12 DIAGNOSIS — O09899 Supervision of other high risk pregnancies, unspecified trimester: Secondary | ICD-10-CM

## 2023-07-12 DIAGNOSIS — O09293 Supervision of pregnancy with other poor reproductive or obstetric history, third trimester: Secondary | ICD-10-CM

## 2023-07-12 DIAGNOSIS — Z8759 Personal history of other complications of pregnancy, childbirth and the puerperium: Secondary | ICD-10-CM

## 2023-07-12 NOTE — Progress Notes (Signed)
Needs refill on potassium if she needs to continue this medication. [name] RN

## 2023-07-12 NOTE — Progress Notes (Signed)
   PRENATAL VISIT NOTE  Subjective:  Shannon Ruiz is a 27 y.o. Z6X0960 at [redacted]w[redacted]d being seen today for ongoing prenatal care.  She is currently monitored for the following issues for this high-risk pregnancy and has Former smoker; Anxiety disorder affecting pregnancy, antepartum; Supervision of other high risk pregnancy, antepartum, unspecified trimester; Obesity in pregnancy, antepartum; History of gestational hypertension; Short interval between pregnancies affecting pregnancy, antepartum; History of postpartum hemorrhage; Urinary tract infection affecting pregnancy; History of IUGR (intrauterine growth retardation) and stillbirth, currently pregnant, third trimester; and Gestational diabetes mellitus (GDM) in third trimester on their problem list.  Patient reports no complaints.  Contractions: Irritability. Vag. Bleeding: None.  Movement: Present. Denies leaking of fluid.   The following portions of the patient's history were reviewed and updated as appropriate: allergies, current medications, past family history, past medical history, past social history, past surgical history and problem list.   Objective:   Vitals:   07/12/23 1404  BP: 130/88  Pulse: 83  Weight: 223 lb (101.2 kg)    Fetal Status: Fetal Heart Rate (bpm): 140   Movement: Present     General:  Alert, oriented and cooperative. Patient is in no acute distress.  Skin: Skin is warm and dry. No rash noted.   Cardiovascular: Normal heart rate noted  Respiratory: Normal respiratory effort, no problems with respiration noted  Abdomen: Soft, gravid, appropriate for gestational age.  Pain/Pressure: Present     Pelvic: Cervical exam deferred        Extremities: Normal range of motion.  Edema: Trace  Mental Status: Normal mood and affect. Normal behavior. Normal judgment and thought content.   Assessment and Plan:  Pregnancy: A5W0981 at [redacted]w[redacted]d 1. Supervision of other high risk pregnancy, antepartum, unspecified trimester FHT and  FH normal  2. History of IUGR (intrauterine growth retardation) and stillbirth, currently pregnant, third trimester Normal growth  3. History of gestational hypertension BP normal  4. History of postpartum hemorrhage  5. Diet controlled gestational diabetes mellitus (GDM) in third trimester Blood sugars controlled. No concerns. EFW 44%.  Preterm labor symptoms and general obstetric precautions including but not limited to vaginal bleeding, contractions, leaking of fluid and fetal movement were reviewed in detail with the patient. Please refer to After Visit Summary for other counseling recommendations.   No follow-ups on file.  Future Appointments  Date Time Provider Department Center  07/20/2023  2:10 PM Levie Heritage, DO [facility] None  07/27/2023 10:55 AM Anyanwu, Jethro Bastos, MD [facility] None    Levie Heritage, DO

## 2023-07-20 ENCOUNTER — Other Ambulatory Visit (HOSPITAL_COMMUNITY): Admission: RE | Admit: 2023-07-20 | Payer: Medicaid Other | Source: Ambulatory Visit

## 2023-07-20 ENCOUNTER — Ambulatory Visit (INDEPENDENT_AMBULATORY_CARE_PROVIDER_SITE_OTHER): Payer: Medicaid Other | Admitting: Family Medicine

## 2023-07-20 VITALS — BP 136/87 | HR 83 | Wt 222.0 lb

## 2023-07-20 DIAGNOSIS — O09899 Supervision of other high risk pregnancies, unspecified trimester: Secondary | ICD-10-CM | POA: Insufficient documentation

## 2023-07-20 DIAGNOSIS — O24410 Gestational diabetes mellitus in pregnancy, diet controlled: Secondary | ICD-10-CM

## 2023-07-20 DIAGNOSIS — Z8759 Personal history of other complications of pregnancy, childbirth and the puerperium: Secondary | ICD-10-CM

## 2023-07-20 DIAGNOSIS — O09293 Supervision of pregnancy with other poor reproductive or obstetric history, third trimester: Secondary | ICD-10-CM

## 2023-07-20 NOTE — Progress Notes (Signed)
   PRENATAL VISIT NOTE  Subjective:  Shannon Ruiz is a 27 y.o. [id number] at [redacted]w[redacted]d being seen today for ongoing prenatal care.  She is currently monitored for the following issues for this high-risk pregnancy and has Former smoker; Anxiety disorder affecting pregnancy, antepartum; Supervision of other high risk pregnancy, antepartum, unspecified trimester; Obesity in pregnancy, antepartum; History of gestational hypertension; Short interval between pregnancies affecting pregnancy, antepartum; History of postpartum hemorrhage; Urinary tract infection affecting pregnancy; History of IUGR (intrauterine growth retardation) and stillbirth, currently pregnant, third trimester; and Gestational diabetes mellitus (GDM) in third trimester on their problem list.  Patient reports no complaints.  Contractions: Irregular. Vag. Bleeding: None.  Movement: Present. Denies leaking of fluid.   The following portions of the patient's history were reviewed and updated as appropriate: allergies, current medications, past family history, past medical history, past social history, past surgical history and problem list.   Objective:   Vitals:   07/20/23 1416  BP: 136/87  Pulse: 83  Weight: 222 lb (100.7 kg)    Fetal Status: Fetal Heart Rate (bpm): 139 Fundal Height: 37 cm Movement: Present  Presentation: Vertex  General:  Alert, oriented and cooperative. Patient is in no acute distress.  Skin: Skin is warm and dry. No rash noted.   Cardiovascular: Normal heart rate noted  Respiratory: Normal respiratory effort, no problems with respiration noted  Abdomen: Soft, gravid, appropriate for gestational age.  Pain/Pressure: Present     Pelvic: Cervical exam performed in the presence of a chaperone Dilation: 1 Effacement (%): 50 Station: -3  Extremities: Normal range of motion.  Edema: None  Mental Status: Normal mood and affect. Normal behavior. Normal judgment and thought content.   Assessment and Plan:  Pregnancy:  [id number] at [redacted]w[redacted]d 1. Supervision of other high risk pregnancy, antepartum, unspecified trimester FHT normal Vertex on exam  2. Diet controlled gestational diabetes mellitus (GDM) in third trimester CBGs controlled Induction at due date   3. History of IUGR (intrauterine growth retardation) and stillbirth, currently pregnant, third trimester  4. History of gestational hypertension BP controlled  5. Short interval between pregnancies affecting pregnancy, antepartum   Term labor symptoms and general obstetric precautions including but not limited to vaginal bleeding, contractions, leaking of fluid and fetal movement were reviewed in detail with the patient. Please refer to After Visit Summary for other counseling recommendations.   No follow-ups on file.  Future Appointments  Date Time Provider Department Center  07/27/2023 10:55 AM Anyanwu, Jethro Bastos, MD [facility] None    Levie Heritage, DO

## 2023-07-20 NOTE — Progress Notes (Signed)
ROB: Lower back pain and contractions since last night

## 2023-07-27 ENCOUNTER — Ambulatory Visit (INDEPENDENT_AMBULATORY_CARE_PROVIDER_SITE_OTHER): Payer: Medicaid Other | Admitting: Obstetrics & Gynecology

## 2023-07-27 ENCOUNTER — Encounter: Payer: Self-pay | Admitting: Obstetrics & Gynecology

## 2023-07-27 VITALS — BP 125/89 | HR 86 | Wt 223.0 lb

## 2023-07-27 DIAGNOSIS — Z3A38 38 weeks gestation of pregnancy: Secondary | ICD-10-CM

## 2023-07-27 DIAGNOSIS — O09899 Supervision of other high risk pregnancies, unspecified trimester: Secondary | ICD-10-CM

## 2023-07-27 DIAGNOSIS — O24410 Gestational diabetes mellitus in pregnancy, diet controlled: Secondary | ICD-10-CM

## 2023-07-27 DIAGNOSIS — F419 Anxiety disorder, unspecified: Secondary | ICD-10-CM

## 2023-07-27 DIAGNOSIS — O99340 Other mental disorders complicating pregnancy, unspecified trimester: Secondary | ICD-10-CM

## 2023-07-27 DIAGNOSIS — O09293 Supervision of pregnancy with other poor reproductive or obstetric history, third trimester: Secondary | ICD-10-CM

## 2023-07-27 DIAGNOSIS — Z8759 Personal history of other complications of pregnancy, childbirth and the puerperium: Secondary | ICD-10-CM

## 2023-07-27 MED ORDER — CITALOPRAM HYDROBROMIDE 10 MG PO TABS: 10.0000 mg | ORAL_TABLET | Freq: Every day | ORAL | 3 refills | Status: DC

## 2023-07-27 NOTE — Patient Instructions (Signed)

## 2023-07-27 NOTE — Progress Notes (Signed)
PRENATAL VISIT NOTE  Subjective:  Shannon Ruiz is a 27 y.o. [id number] at [redacted]w[redacted]d being seen today for ongoing prenatal care.  She is currently monitored for the following issues for this high-risk pregnancy and has Former smoker; Anxiety disorder affecting pregnancy, antepartum; Supervision of other high risk pregnancy, antepartum, unspecified trimester; Obesity in pregnancy, antepartum; History of gestational hypertension; Short interval between pregnancies affecting pregnancy, antepartum; History of postpartum hemorrhage; Urinary tract infection affecting pregnancy; History of IUGR (intrauterine growth retardation) and stillbirth, currently pregnant, third trimester; and Gestational diabetes mellitus (GDM) in third trimester on their problem list.  Patient reports no complaints. Wants Celexa refill.  Contractions: Irritability. Vag. Bleeding: None.  Movement: Present. Denies leaking of fluid.   The following portions of the patient's history were reviewed and updated as appropriate: allergies, current medications, past family history, past medical history, past social history, past surgical history and problem list.   Objective:   Vitals:   07/27/23 1110  BP: 125/89  Pulse: 86  Weight: 223 lb (101.2 kg)    Fetal Status: Fetal Heart Rate (bpm): 140   Movement: Present  Presentation: Vertex  General:  Alert, oriented and cooperative. Patient is in no acute distress.  Skin: Skin is warm and dry. No rash noted.   Cardiovascular: Normal heart rate noted  Respiratory: Normal respiratory effort, no problems with respiration noted  Abdomen: Soft, gravid, appropriate for gestational age.  Pain/Pressure: Absent     Pelvic: Cervical exam deferred Dilation: 2 Effacement (%): 50 Station: -3  Extremities: Normal range of motion.  Edema: Trace  Mental Status: Normal mood and affect. Normal behavior. Normal judgment and thought content.    [place] MFM OB FOLLOW UP  Result Date:  07/06/2023 ----------------------------------------------------------------------  OBSTETRICS REPORT                       (Signed Final 07/06/2023 12:01 pm) ---------------------------------------------------------------------- Patient Info  ID #:       [id number]                          D.O.B.:  1996-09-07 (27 yrs)  Name:       Shannon Ruiz                      Visit Date: 07/06/2023 11:06 am ---------------------------------------------------------------------- Performed By  Attending:        Noralee Space MD        Ref. Address:     [address]                                                             [place], [place]                                                             [address]  Performed By:     Anabel Halon          [facility]  Referred By:      Levie Heritage                    MD ---------------------------------------------------------------------- Orders  #  Description                           Code        Ordered By  1  [place] MFM OB FOLLOW UP                   16109.60    Noralee Space ----------------------------------------------------------------------  #  Order #                     Accession #                Episode #  1  [id number]                   [id number]                 [id number] ---------------------------------------------------------------------- Indications  Gestational diabetes in pregnancy,             O24.419  unspecified control  Breech presentation                            O32.1XX0  Obesity complicating pregnancy, third          O99.213  trimester  Short interval between pregancies, 3rd         O09.893  trimester  [redacted] weeks gestation of pregnancy                Z3A.35  Encounter for other antenatal screening        Z36.2  follow-up  Poor obstetric  history: Previous fetal growth  O09.299  restriction (FGR)  Poor obstetric history: Previous               O09.299  preeclampsia / eclampsia/gestational HTN  LR NIPS/AFP negative/Horizon negative ---------------------------------------------------------------------- Fetal Evaluation  Num Of Fetuses:         1  Fetal Heart Rate(bpm):  149  Cardiac Activity:       Observed  Presentation:           Breech  Placenta:               Posterior  P. Cord Insertion:      Previously visualized  Amniotic Fluid  AFI  FV:      Within normal limits  AFI Sum(cm)     %Tile       Largest Pocket(cm)  17.02           62          6.76  RUQ(cm)       RLQ(cm)       LUQ(cm)        LLQ(cm)  4.69          6.76          5.57           0 ---------------------------------------------------------------------- Biometry  BPD:      87.5  mm     G. Age:  35w 2d         62  %    CI:        81.18   %    70 - 86                                                          FL/HC:      21.3   %    20.1 - 22.3  HC:      306.6  mm     G. Age:  34w 1d          6  %    HC/AC:      0.97        0.93 - 1.11  AC:      316.4  mm     G. Age:  35w 4d         73  %    FL/BPD:     74.7   %    71 - 87  FL:       65.4  mm     G. Age:  33w 5d         14  %    FL/AC:      20.7   %    20 - 24  Est. FW:    2544  gm    5 lb 10 oz      44  % ---------------------------------------------------------------------- OB History  Gravidity:    4         Term:   2        Prem:   0        SAB:   1  TOP:          0       Ectopic:  0        Living: 2 ---------------------------------------------------------------------- Gestational Age  LMP:           35w 0d        Date:  11/03/22                  EDD:   08/10/23  U/S Today:     34w 5d                                        EDD:   08/12/23  Best:          35w 0d     Det. By:  LMP  (11/03/22)  EDD:   08/10/23 ---------------------------------------------------------------------- Anatomy  Cranium:               Appears normal          LVOT:                   Previously seen  Cavum:                 Previously seen        Aortic Arch:            Not well visualized  Ventricles:            Previously seen        Ductal Arch:            Previously seen  Choroid Plexus:        Previously seen        Diaphragm:              Appears normal  Cerebellum:            Previously seen        Stomach:                Appears normal, left                                                                        sided  Posterior Fossa:       Previously seen        Abdomen:                Previously seen  Nuchal Fold:           Not applicable (>20    Abdominal Wall:         Previously seen                         wks GA)  Face:                  Orbits and profile     Cord Vessels:           Previously seen                         previously seen  Lips:                  Previously seen        Kidneys:                Appear normal  Palate:                Not well visualized    Bladder:                Appears normal  Thoracic:              Previously seen        Spine:                  Limited views  previously seen  Heart:                 Previously seen        Upper Extremities:      Previously seen  RVOT:                  Previously seen        Lower Extremities:      Previously seen  Other:  Hands and feet/heels previously visualized. VC, 3VV and 3VTV          visualized. Nasal bone, lenses and falx previously visualized.  Maxilla          and mandible visualized. Fetus appears to be a female. ---------------------------------------------------------------------- Cervix Uterus Adnexa  Cervix  Not visualized (advanced GA >24wks)  Uterus  No abnormality visualized. ---------------------------------------------------------------------- Impression  Gestational diabetes.  Uncertain control.  Amniotic fluid is normal good fetal activity seen.  Fetal growth  is appropriate for gestational age.  Breech  presentation  I discussed breech presentation and the external cephalic  version at [redacted] weeks gestation if malpresentation persists. ---------------------------------------------------------------------- Recommendations  -No follow-up appointments were made . ----------------------------------------------------------------------                 Noralee Space, MD Electronically Signed Final Report   07/06/2023 12:01 pm ----------------------------------------------------------------------    Assessment and Plan:  Pregnancy: [id number] at [redacted]w[redacted]d 1. Diet controlled gestational diabetes mellitus (GDM) in third trimester Not checking her sugars regularly but the few checked are within range as per patient. Continue diet and exercise control for now.  2. History of gestational hypertension Stable BP. On ldASA.  3. History of IUGR (intrauterine growth retardation) and stillbirth, currently pregnant, third trimester Appropriate EFW recently.   4. Anxiety disorder affecting pregnancy, antepartum Celexa refilled. - citalopram (CELEXA) 10 MG tablet; Take 1 tablet (10 mg total) by mouth daily.  Dispense: 60 tablet; Refill: 3  5. [redacted] weeks gestation of pregnancy 6. Supervision of other high risk pregnancy, antepartum Labor symptoms and general obstetric precautions including but not limited to vaginal bleeding, contractions, leaking of fluid and fetal movement were reviewed in detail with the patient. Please refer to After Visit Summary for other counseling recommendations.   Return in about 1 week (around 08/03/2023) for OFFICE OB VISIT (MD only).  Future Appointments  Date Time Provider Department Center  08/03/2023 10:55 AM Milas Hock, MD [facility] None  08/10/2023 11:15 AM Levie Heritage, DO [facility] None  08/31/2023 11:15 AM Adrian Blackwater Rhona Raider, DO [facility] None    Jaynie Collins, MD

## 2023-07-31 ENCOUNTER — Encounter: Payer: Self-pay | Admitting: Family Medicine

## 2023-08-01 MED ORDER — LOPERAMIDE HCL 2 MG PO TABS: 2.0000 mg | ORAL_TABLET | Freq: Four times a day (QID) | ORAL | 0 refills | Status: DC | PRN

## 2023-08-03 ENCOUNTER — Encounter: Payer: Self-pay | Admitting: Obstetrics and Gynecology

## 2023-08-03 ENCOUNTER — Inpatient Hospital Stay (HOSPITAL_COMMUNITY): Payer: Medicaid Other | Admitting: Anesthesiology

## 2023-08-03 ENCOUNTER — Other Ambulatory Visit: Payer: Self-pay

## 2023-08-03 ENCOUNTER — Inpatient Hospital Stay (HOSPITAL_COMMUNITY)
Admission: AD | Admit: 2023-08-03 | Discharge: 2023-08-06 | DRG: 807 | Disposition: A | Discharge status: Home / Self Care | Payer: Medicaid Other | Attending: Obstetrics & Gynecology | Admitting: Obstetrics & Gynecology

## 2023-08-03 ENCOUNTER — Encounter (HOSPITAL_COMMUNITY): Payer: Self-pay | Admitting: Obstetrics and Gynecology

## 2023-08-03 ENCOUNTER — Ambulatory Visit (INDEPENDENT_AMBULATORY_CARE_PROVIDER_SITE_OTHER): Payer: Medicaid Other | Admitting: Obstetrics and Gynecology

## 2023-08-03 VITALS — BP 143/105 | HR 87 | Wt 221.0 lb

## 2023-08-03 DIAGNOSIS — O99214 Obesity complicating childbirth: Secondary | ICD-10-CM | POA: Diagnosis present

## 2023-08-03 DIAGNOSIS — Z3A39 39 weeks gestation of pregnancy: Secondary | ICD-10-CM | POA: Diagnosis not present

## 2023-08-03 DIAGNOSIS — O99344 Other mental disorders complicating childbirth: Secondary | ICD-10-CM | POA: Diagnosis not present

## 2023-08-03 DIAGNOSIS — Z87891 Personal history of nicotine dependence: Secondary | ICD-10-CM | POA: Diagnosis not present

## 2023-08-03 DIAGNOSIS — Z79899 Other long term (current) drug therapy: Secondary | ICD-10-CM

## 2023-08-03 DIAGNOSIS — O2340 Unspecified infection of urinary tract in pregnancy, unspecified trimester: Principal | ICD-10-CM

## 2023-08-03 DIAGNOSIS — O09293 Supervision of pregnancy with other poor reproductive or obstetric history, third trimester: Secondary | ICD-10-CM

## 2023-08-03 DIAGNOSIS — O99210 Obesity complicating pregnancy, unspecified trimester: Secondary | ICD-10-CM

## 2023-08-03 DIAGNOSIS — O99340 Other mental disorders complicating pregnancy, unspecified trimester: Secondary | ICD-10-CM

## 2023-08-03 DIAGNOSIS — O09899 Supervision of other high risk pregnancies, unspecified trimester: Secondary | ICD-10-CM

## 2023-08-03 DIAGNOSIS — O134 Gestational [pregnancy-induced] hypertension without significant proteinuria, complicating childbirth: Principal | ICD-10-CM | POA: Diagnosis present

## 2023-08-03 DIAGNOSIS — O24410 Gestational diabetes mellitus in pregnancy, diet controlled: Secondary | ICD-10-CM

## 2023-08-03 DIAGNOSIS — F419 Anxiety disorder, unspecified: Secondary | ICD-10-CM

## 2023-08-03 DIAGNOSIS — Z8759 Personal history of other complications of pregnancy, childbirth and the puerperium: Secondary | ICD-10-CM

## 2023-08-03 DIAGNOSIS — O24420 Gestational diabetes mellitus in childbirth, diet controlled: Secondary | ICD-10-CM | POA: Diagnosis present

## 2023-08-03 DIAGNOSIS — O139 Gestational [pregnancy-induced] hypertension without significant proteinuria, unspecified trimester: Secondary | ICD-10-CM | POA: Diagnosis present

## 2023-08-03 DIAGNOSIS — O24419 Gestational diabetes mellitus in pregnancy, unspecified control: Secondary | ICD-10-CM | POA: Diagnosis present

## 2023-08-03 LAB — CBC
HCT: 33.4 % — ABNORMAL LOW (ref 36.0–46.0)
HCT: 33.8 % — ABNORMAL LOW (ref 36.0–46.0)
Hemoglobin: 11.3 g/dL — ABNORMAL LOW (ref 12.0–15.0)
Hemoglobin: 11.2 g/dL — ABNORMAL LOW (ref 12.0–15.0)
MCH: 27.8 pg (ref 26.0–34.0)
MCH: 26.9 pg (ref 26.0–34.0)
MCHC: 33.8 g/dL (ref 30.0–36.0)
MCHC: 33.1 g/dL (ref 30.0–36.0)
MCV: 82.1 fL (ref 80.0–100.0)
MCV: 81.1 fL (ref 80.0–100.0)
Platelets: 351 10*3/uL (ref 150–400)
Platelets: 378 10*3/uL (ref 150–400)
RBC: 4.07 MIL/uL (ref 3.87–5.11)
RBC: 4.17 MIL/uL (ref 3.87–5.11)
RDW: 14.4 % (ref 11.5–15.5)
RDW: 14.6 % (ref 11.5–15.5)
WBC: 16.9 10*3/uL — ABNORMAL HIGH (ref 4.0–10.5)
WBC: 20.1 10*3/uL — ABNORMAL HIGH (ref 4.0–10.5)
nRBC: 0 % (ref 0.0–0.2)
nRBC: 0 % (ref 0.0–0.2)

## 2023-08-03 LAB — COMPREHENSIVE METABOLIC PANEL
ALT: 11 U/L (ref 0–44)
AST: 16 U/L (ref 15–41)
Albumin: 2.5 g/dL — ABNORMAL LOW (ref 3.5–5.0)
Alkaline Phosphatase: 108 U/L (ref 38–126)
Anion gap: 12 (ref 5–15)
BUN: <5 mg/dL — ABNORMAL LOW (ref 6–20)
CO2: 19 mmol/L — ABNORMAL LOW (ref 22–32)
Calcium: 8.9 mg/dL (ref 8.9–10.3)
Chloride: 108 mmol/L (ref 98–111)
Creatinine, Ser: 0.63 mg/dL (ref 0.44–1.00)
GFR, Estimated: >60 mL/min (ref >60)
Glucose, Bld: 101 mg/dL — ABNORMAL HIGH (ref 70–99)
Potassium: 2.8 mmol/L — ABNORMAL LOW (ref 3.5–5.1)
Sodium: 139 mmol/L (ref 135–145)
Total Bilirubin: 0.3 mg/dL (ref 0.3–1.2)
Total Protein: 6.3 g/dL — ABNORMAL LOW (ref 6.5–8.1)

## 2023-08-03 LAB — PROTEIN / CREATININE RATIO, URINE
Creatinine, Urine: 85 mg/dL
Protein Creatinine Ratio: 0.22 mg/mg{Cre} — ABNORMAL HIGH (ref 0.00–0.15)
Total Protein, Urine: 19 mg/dL

## 2023-08-03 LAB — TYPE AND SCREEN
ABO/RH(D): A POS
Antibody Screen: NEGATIVE

## 2023-08-03 LAB — GLUCOSE, CAPILLARY: Glucose-Capillary: 65 mg/dL — ABNORMAL LOW (ref 70–99)

## 2023-08-03 MED ORDER — OXYTOCIN-SODIUM CHLORIDE 30-0.9 UT/500ML-% IV SOLN
1.0000 m[IU]/min | INTRAVENOUS | Status: DC
  Administered 2023-08-03: 2 m[IU]/min via INTRAVENOUS

## 2023-08-03 MED ORDER — HYDROXYZINE HCL 50 MG PO TABS: 50.0000 mg | ORAL_TABLET | Freq: Four times a day (QID) | ORAL | Status: DC | PRN

## 2023-08-03 MED ORDER — EPHEDRINE 5 MG/ML INJ: 10.0000 mg | INTRAVENOUS | Status: DC | PRN

## 2023-08-03 MED ORDER — SOD CITRATE-CITRIC ACID 500-334 MG/5ML PO SOLN: 30.0000 mL | ORAL | Status: DC | PRN

## 2023-08-03 MED ORDER — LACTATED RINGERS IV SOLN: INTRAVENOUS | Status: DC

## 2023-08-03 MED ORDER — LIDOCAINE HCL (PF) 1 % IJ SOLN: 30.0000 mL | INTRAMUSCULAR | Status: DC | PRN

## 2023-08-03 MED ORDER — FENTANYL CITRATE (PF) 100 MCG/2ML IJ SOLN: 50.0000 ug | INTRAMUSCULAR | Status: DC | PRN

## 2023-08-03 MED ORDER — FENTANYL-BUPIVACAINE-NACL 0.5-0.125-0.9 MG/250ML-% EP SOLN
12.0000 mL/h | EPIDURAL | Status: DC | PRN
  Administered 2023-08-03: 12 mL/h via EPIDURAL
  Filled 2023-08-03: qty 250

## 2023-08-03 MED ORDER — PHENYLEPHRINE 80 MCG/ML (10ML) SYRINGE FOR IV PUSH (FOR BLOOD PRESSURE SUPPORT): 80.0000 ug | PREFILLED_SYRINGE | INTRAVENOUS | Status: DC | PRN

## 2023-08-03 MED ORDER — OXYCODONE-ACETAMINOPHEN 5-325 MG PO TABS: 2.0000 | ORAL_TABLET | ORAL | Status: DC | PRN

## 2023-08-03 MED ORDER — OXYTOCIN-SODIUM CHLORIDE 30-0.9 UT/500ML-% IV SOLN
2.5000 [IU]/h | INTRAVENOUS | Status: DC
  Filled 2023-08-03: qty 500

## 2023-08-03 MED ORDER — ACETAMINOPHEN 325 MG PO TABS: 650.0000 mg | ORAL_TABLET | ORAL | Status: DC | PRN

## 2023-08-03 MED ORDER — LACTATED RINGERS IV SOLN
500.0000 mL | Freq: Once | INTRAVENOUS | Status: AC
  Administered 2023-08-03: 500 mL via INTRAVENOUS

## 2023-08-03 MED ORDER — LACTATED RINGERS IV SOLN: 500.0000 mL | INTRAVENOUS | Status: DC | PRN

## 2023-08-03 MED ORDER — ONDANSETRON HCL 4 MG/2ML IJ SOLN: 4.0000 mg | Freq: Four times a day (QID) | INTRAMUSCULAR | Status: DC | PRN

## 2023-08-03 MED ORDER — OXYTOCIN BOLUS FROM INFUSION
333.0000 mL | Freq: Once | INTRAVENOUS | Status: AC
  Administered 2023-08-04: 333 mL via INTRAVENOUS

## 2023-08-03 MED ORDER — OXYCODONE-ACETAMINOPHEN 5-325 MG PO TABS: 1.0000 | ORAL_TABLET | ORAL | Status: DC | PRN

## 2023-08-03 MED ORDER — DIPHENHYDRAMINE HCL 50 MG/ML IJ SOLN: 12.5000 mg | INTRAMUSCULAR | Status: DC | PRN

## 2023-08-03 MED ORDER — TERBUTALINE SULFATE 1 MG/ML IJ SOLN: 0.2500 mg | Freq: Once | INTRAMUSCULAR | Status: DC | PRN

## 2023-08-03 MED ORDER — LIDOCAINE HCL (PF) 1 % IJ SOLN
INTRAMUSCULAR | Status: DC | PRN
  Administered 2023-08-03 (×2): 5 mL via EPIDURAL

## 2023-08-03 MED ORDER — CITALOPRAM HYDROBROMIDE 20 MG PO TABS
10.0000 mg | ORAL_TABLET | Freq: Every day | ORAL | Status: DC
  Administered 2023-08-04 – 2023-08-06 (×3): 10 mg via ORAL
  Filled 2023-08-03 (×3): qty 1

## 2023-08-03 NOTE — Progress Notes (Signed)
   PRENATAL VISIT NOTE  Subjective:  Shannon Ruiz is a 27 y.o. [id number] at [redacted]w[redacted]d being seen today for ongoing prenatal care.  She is currently monitored for the following issues for this high-risk pregnancy and has Former smoker; Anxiety disorder affecting pregnancy, antepartum; Supervision of other high risk pregnancy, antepartum, unspecified trimester; Obesity in pregnancy, antepartum; History of gestational hypertension; Short interval between pregnancies affecting pregnancy, antepartum; History of postpartum hemorrhage; Urinary tract infection affecting pregnancy; History of IUGR (intrauterine growth retardation) and stillbirth, currently pregnant, third trimester; and Gestational diabetes mellitus (GDM) in third trimester on their problem list.  Patient reports no complaints.  Contractions: Irritability. Vag. Bleeding: None.  Movement: Present. Denies leaking of fluid.   The following portions of the patient's history were reviewed and updated as appropriate: allergies, current medications, past family history, past medical history, past social history, past surgical history and problem list.   Objective:   Vitals:   08/03/23 1052 08/03/23 1105  BP: (!) 130/94 (!) 143/105  Pulse: 90 87  Weight: 221 lb (100.2 kg)     Fetal Status: Fetal Heart Rate (bpm): 131 Fundal Height: 37 cm Movement: Present     General:  Alert, oriented and cooperative. Patient is in no acute distress.  Skin: Skin is warm and dry. No rash noted.   Cardiovascular: Normal heart rate noted  Respiratory: Normal respiratory effort, no problems with respiration noted  Abdomen: Soft, gravid, appropriate for gestational age.  Pain/Pressure: Present     Pelvic: Cervical exam performed in the presence of a chaperone Dilation: 3 Effacement (%): 50 Station: -2  Extremities: Normal range of motion.  Edema: None  Mental Status: Normal mood and affect. Normal behavior. Normal judgment and thought content.   Assessment and Plan:   Pregnancy: [id number] at [redacted]w[redacted]d 1. [redacted] weeks gestation of pregnancy  2. Supervision of other high risk pregnancy, antepartum, unspecified trimester Membrane sweep done today at pt request and with her consent.   3. Obesity in pregnancy, antepartum  4. History of postpartum hemorrhage This was traumatic for the patient and why she would like to avoid IOL.   5. Short interval between pregnancies affecting pregnancy, antepartum  6. History of IUGR (intrauterine growth retardation) and stillbirth, currently pregnant, third trimester Normal growth on last [place].   7. History of gestational hypertension BP elevated today. Recommended direct admit and IOL. However given h/o PPH (delayed due to retained placental fragment 1 week PP), she is scared of IOL and only wants to do it medically necessary. She is up for going to [facility] and trending Bps there and if elevated she understands we will recommend IOL.   8. Urinary tract infection affecting pregnancy Treated in February.   9. Diet controlled gestational diabetes mellitus (GDM) in third trimester Reports CBGs has been normal but no log. Normal growth on last [place] (44%ile). Normal AFI and AC.   10. Anxiety disorder affecting pregnancy, antepartum Controlled on Celexa.   Term labor symptoms and general obstetric precautions including but not limited to vaginal bleeding, contractions, leaking of fluid and fetal movement were reviewed in detail with the patient. Please refer to After Visit Summary for other counseling recommendations.   No follow-ups on file.  Future Appointments  Date Time Provider Department Center  08/10/2023 11:15 AM Levie Heritage, DO [facility] None  08/31/2023 11:15 AM Levie Heritage, DO [facility] None    Milas Hock, MD

## 2023-08-03 NOTE — Anesthesia Preprocedure Evaluation (Signed)
Anesthesia Evaluation  Patient identified by MRN, date of birth, ID band Patient awake    Reviewed: Allergy & Precautions, Patient's Chart, lab work & pertinent test results  Airway Mallampati: III      Comment: Pierced nose Dental  (+) Poor Dentition   Pulmonary neg pulmonary ROS, former smoker   Pulmonary exam normal        Cardiovascular hypertension, Normal cardiovascular exam Rhythm:Regular Rate:Normal     Neuro/Psych  PSYCHIATRIC DISORDERS Anxiety Depression       GI/Hepatic Neg liver ROS,GERD  ,,  Endo/Other  diabetes, Gestational  Obesity  Renal/GU negative Renal ROS  negative genitourinary   Musculoskeletal negative musculoskeletal ROS (+)    Abdominal  (+) + obese  Peds  Hematology  (+) Blood dyscrasia, anemia   Anesthesia Other Findings   Reproductive/Obstetrics (+) Pregnancy                              Anesthesia Physical Anesthesia Plan  ASA: 2  Anesthesia Plan: Epidural   Post-op Pain Management:    Induction:   PONV Risk Score and Plan:   Airway Management Planned: Natural Airway  Additional Equipment:   Intra-op Plan:   Post-operative Plan:   Informed Consent: I have reviewed the patients History and Physical, chart, labs and discussed the procedure including the risks, benefits and alternatives for the proposed anesthesia with the patient or authorized representative who has indicated his/her understanding and acceptance.       Plan Discussed with: Anesthesiologist  Anesthesia Plan Comments:          Anesthesia Quick Evaluation

## 2023-08-03 NOTE — Progress Notes (Signed)
Labor Progress Note  Shannon Ruiz is a 27 y.o. I6N6295 at [redacted]w[redacted]d presented for IOL GHTN  S: Patient doing well, ambulating in room.  Most recent CBG 65.  Patient will eat a popsicle  O:  BP (!) 139/99   Pulse 68   Ht 5\' 3"  (1.6 m)   Wt 100.2 kg   LMP 11/03/2022   BMI 39.15 kg/m  EFM: 140 bpm/Moderate variability/ 15x15 accels/ None decels CAT: 1 Toco: regular, every 2-4 minutes   CVE: Dilation: 3 Effacement (%): 80 Station: -2 Presentation: Vertex Exam by:: Mary [place] Johnson, RNC-OB   A&P: 27 y.o. [id number] [redacted]w[redacted]d  here for IOL as above  #Labor: Progressing well.  On Pitocin.  Plan AROM after epidural #Pain: Family/Friend support and Epidural planning [name]: CAT 1 [name] negative # GHTN: PEC labs normal.  BP elevated, no severe range.  CTM # History of postpartum hemorrhage: Consider TXA at delivery.  Myrtie Hawk, DO FMOB Fellow, Faculty practice [facility] 08/03/23  8:48 PM

## 2023-08-03 NOTE — H&P (Signed)
OBSTETRIC ADMISSION HISTORY AND PHYSICAL  Tiphanie Connery is a 27 y.o. female [phone] with IUP at [redacted]w[redacted]d by LMP presenting for IOL 2/2 gHTN. She reports +FMs, No LOF, no VB, no blurry vision, headaches or peripheral edema, and RUQ pain.  She plans on  feeding. She request to discuss birth control at her PP visit. She received her prenatal care at  [facility]    Dating: By LMP --->  Estimated Date of Delivery: 08/10/23  Sono:    @[redacted]w[redacted]d , CWD, normal anatomy, breech presentation, posterior placenta, 2544g, 44% EFW  Prenatal History/Complications:  -Anxiety -Hx of FGR -GDM -Short interval pregnancy -gHTN   Past Medical History: Past Medical History:  Diagnosis Date   Anemia    Anxiety    Depression    Supervision of other high risk pregnancy, antepartum, unspecified trimester 01/09/2021   Needs PP MMR   Past Surgical History: Past Surgical History:  Procedure Laterality Date   NO PAST SURGERIES     Obstetrical History: OB History     Gravida  5   Para  2   Term  2   Preterm      AB  2   Living  2      SAB  2   IAB      Ectopic      Multiple  0   Live Births  2           Social History Social History   Socioeconomic History   Marital status: Married    Spouse name: Gery Pray Kissling   Number of children: Not on file   Years of education: Not on file   Highest education level: Not on file  Occupational History   Not on file  Tobacco Use   Smoking status: Former    Types: Cigarettes   Smokeless tobacco: Never  Vaping Use   Vaping status: Former  Substance and Sexual Activity   Alcohol use: Never   Drug use: Never   Sexual activity: Yes    Birth control/protection: None  Other Topics Concern   Not on file  Social History Narrative   Not on file   Social Determinants of Health   Financial Resource Strain: Not on file  Food Insecurity: No Food Insecurity (08/03/2023)   Hunger Vital Sign    Worried About Running Out of Food in the Last Year: Never true     Ran Out of Food in the Last Year: Never true  Transportation Needs: No Transportation Needs (08/03/2023)   PRAPARE - Administrator, Civil Service (Medical): No    Lack of Transportation (Non-Medical): No  Physical Activity: Not on file  Stress: Not on file  Social Connections: Not on file    Family History: Family History  Problem Relation Age of Onset   Anxiety disorder Mother    Hypertension Mother    Heart Problems Mother    Diabetes Father     Allergies: No Known Allergies  Medications Prior to Admission  Medication Sig Dispense Refill Last Dose   citalopram (CELEXA) 10 MG tablet Take 1 tablet (10 mg total) by mouth daily. 60 tablet 3 08/03/2023   Prenatal Vit-Fe Fumarate-FA (PRENATAL VITAMINS PO) Take by mouth.   Past Month   aspirin EC 81 MG tablet Take 1 tablet (81 mg total) by mouth daily. Take daily for rest of pregnancy for prevention of preeclampsia (Patient not taking: Reported on 05/10/2023) 300 tablet 2 More than a month   Blood  Glucose Monitoring Suppl DEVI 1 each by Does not apply route in the morning, at noon, and at bedtime. May substitute to any manufacturer covered by patient's insurance. (Patient not taking: Reported on 07/27/2023) 1 each 0    loperamide (IMODIUM A-D) 2 MG tablet Take 1 tablet (2 mg total) by mouth 4 (four) times daily as needed for diarrhea or loose stools. (Patient not taking: Reported on 08/03/2023) 30 tablet 0    Review of Systems   All systems reviewed and negative except as stated in HPI  Blood pressure (!) 143/91, pulse 72, height 5\' 3"  (1.6 m), weight 100.2 kg, last menstrual period 11/03/2022, unknown if currently breastfeeding. General appearance: alert and no distress Lungs: normal effort Heart: regular rate noted Abdomen: gravid Extremities: No LE edema Presentation: cephalic per exam in office Fetal monitoringBaseline: 140 bpm, Variability: Good {> 6 bpm), Accelerations: Reactive, and Decelerations: Absent Uterine  activity every 6 mins Dilation: 3 Effacement (%): 80 Station: -2 Exam by:: Mary [place] Johnson, RNC-OB   Prenatal labs: ABO, Rh: --/--/A POS (08/08 1435) Antibody: NEG (08/08 1435) Rubella: 0.99 (01/31 1038) RPR: Non Reactive (05/28 0934)  HBsAg: Negative (01/31 1038)  HIV: Non Reactive (05/28 0934)  GBS: Negative/-- (07/25 1519)  1 hr Glucola 121 Genetic screening  LR, neg Anatomy US wnl, female  Prenatal Transfer Tool  Maternal Diabetes: Yes:  Diabetes Type:  Diet controlled Genetic Screening: Normal Maternal Ultrasounds/Referrals: Normal Fetal Ultrasounds or other Referrals:  None Maternal Substance Abuse:  No Significant Maternal Medications:  Meds include: Other:  Celexa Significant Maternal Lab Results:  Group B Strep negative Number of Prenatal Visits:greater than 3 verified prenatal visits Other Comments:  None  Results for orders placed or performed during the hospital encounter of 08/03/23 (from the past 24 hour(s))  CBC   Collection Time: 08/03/23  2:35 PM  Result Value Ref Range   WBC 16.9 (H) 4.0 - 10.5 K/uL   RBC 4.07 3.87 - 5.11 MIL/uL   Hemoglobin 11.3 (L) 12.0 - 15.0 g/dL   HCT 27.0 (L) 62.3 - 76.2 %   MCV 82.1 80.0 - 100.0 fL   MCH 27.8 26.0 - 34.0 pg   MCHC 33.8 30.0 - 36.0 g/dL   RDW 83.1 51.7 - 61.6 %   Platelets 351 150 - 400 K/uL   nRBC 0.0 0.0 - 0.2 %  Comprehensive metabolic panel   Collection Time: 08/03/23  2:35 PM  Result Value Ref Range   Sodium 139 135 - 145 mmol/L   Potassium 2.8 (L) 3.5 - 5.1 mmol/L   Chloride 108 98 - 111 mmol/L   CO2 19 (L) 22 - 32 mmol/L   Glucose, Bld 101 (H) 70 - 99 mg/dL   BUN <5 (L) 6 - 20 mg/dL   Creatinine, Ser 0.73 0.44 - 1.00 mg/dL   Calcium 8.9 8.9 - 71.0 mg/dL   Total Protein 6.3 (L) 6.5 - 8.1 g/dL   Albumin 2.5 (L) 3.5 - 5.0 g/dL   AST 16 15 - 41 U/L   ALT 11 0 - 44 U/L   Alkaline Phosphatase 108 38 - 126 U/L   Total Bilirubin 0.3 0.3 - 1.2 mg/dL   GFR, Estimated >62 >69 mL/min   Anion gap 12 5  - 15  Type and screen [facility]   Collection Time: 08/03/23  2:35 PM  Result Value Ref Range   ABO/RH(D) A POS    Antibody Screen NEG    Sample Expiration  08/06/2023,2359 Performed at [facility], [address] [place], [place] [address]     Patient Active Problem List   Diagnosis Date Noted   Gestational hypertension 08/03/2023   Gestational diabetes mellitus (GDM) in third trimester 06/01/2023   Urinary tract infection affecting pregnancy 01/31/2023   History of IUGR (intrauterine growth retardation) and stillbirth, currently pregnant, third trimester 01/31/2023   Short interval between pregnancies affecting pregnancy, antepartum 01/25/2023   History of postpartum hemorrhage 01/25/2023   History of gestational hypertension 03/14/2022   Obesity in pregnancy, antepartum 09/03/2021   Supervision of other high risk pregnancy, antepartum, unspecified trimester 01/09/2021   Former smoker 06/30/2020   Anxiety disorder affecting pregnancy, antepartum 06/30/2020    Assessment/Plan:  Debar Acha is a 27 y.o. [id number] at [redacted]w[redacted]d here for IOL 2/2 gHTN  #Labor: Start pitocin 2 by 2. AROM after epidural.  #Pain: Planning for epidural [name]: Cat I  #ID: GBS neg #MOF: Breast #MOC: Discuss at PP visit #Circ: yes  gHTN BP mild range. UPC pending, otherwise wnl.  -Continue to monitor  A1GDM CBG 101. Goal is 90-120.  -Continue to monitor  [employer], DO  08/03/2023, 4:21 PM

## 2023-08-03 NOTE — Anesthesia Procedure Notes (Signed)
Epidural Patient location during procedure: OB Start time: 08/03/2023 9:42 PM End time: 08/03/2023 9:50 PM  Staffing Anesthesiologist: Mal Amabile, MD Performed: anesthesiologist   Preanesthetic Checklist Completed: patient identified, IV checked, site marked, risks and benefits discussed, surgical consent, monitors and equipment checked, pre-op evaluation and timeout performed  Epidural Patient position: sitting Prep: DuraPrep and site prepped and draped Patient monitoring: continuous pulse ox and blood pressure Approach: midline Location: L4-L5 Injection technique: LOR air  Needle:  Needle type: Tuohy  Needle gauge: 17 G Needle length: 9 cm and 9 Needle insertion depth: 5 cm Catheter type: closed end flexible Catheter size: 19 Gauge Catheter at skin depth: 10 cm Test dose: negative and Other  Assessment Events: blood not aspirated, no cerebrospinal fluid, injection not painful, no injection resistance, no paresthesia and negative IV test  Additional Notes Patient identified. Risks and benefits discussed including failed block, incomplete  Pain control, post dural puncture headache, nerve damage, paralysis, blood pressure Changes, nausea, vomiting, reactions to medications-both toxic and allergic and post Partum back pain. All questions were answered. Patient expressed understanding and wished to proceed. Sterile technique was used throughout procedure. Epidural site was Dressed with sterile barrier dressing. No paresthesias, signs of intravascular injection Or signs of intrathecal spread were encountered.  Patient was more comfortable after the epidural was dosed. Please see RN's note for documentation of vital signs and FHR which are stable. Reason for block:procedure for pain

## 2023-08-04 ENCOUNTER — Encounter (HOSPITAL_COMMUNITY): Payer: Self-pay | Admitting: Family Medicine

## 2023-08-04 DIAGNOSIS — O99340 Other mental disorders complicating pregnancy, unspecified trimester: Secondary | ICD-10-CM

## 2023-08-04 DIAGNOSIS — O99344 Other mental disorders complicating childbirth: Secondary | ICD-10-CM

## 2023-08-04 DIAGNOSIS — Z3A39 39 weeks gestation of pregnancy: Secondary | ICD-10-CM

## 2023-08-04 DIAGNOSIS — O134 Gestational [pregnancy-induced] hypertension without significant proteinuria, complicating childbirth: Secondary | ICD-10-CM

## 2023-08-04 DIAGNOSIS — O24420 Gestational diabetes mellitus in childbirth, diet controlled: Secondary | ICD-10-CM

## 2023-08-04 LAB — CBC
HCT: 29.2 % — ABNORMAL LOW (ref 36.0–46.0)
Hemoglobin: 9.8 g/dL — ABNORMAL LOW (ref 12.0–15.0)
MCH: 27.1 pg (ref 26.0–34.0)
MCHC: 33.6 g/dL (ref 30.0–36.0)
MCV: 80.7 fL (ref 80.0–100.0)
Platelets: 314 10*3/uL (ref 150–400)
RBC: 3.62 MIL/uL — ABNORMAL LOW (ref 3.87–5.11)
RDW: 14.6 % (ref 11.5–15.5)
WBC: 18.4 10*3/uL — ABNORMAL HIGH (ref 4.0–10.5)
nRBC: 0 % (ref 0.0–0.2)

## 2023-08-04 MED ORDER — SENNOSIDES-DOCUSATE SODIUM 8.6-50 MG PO TABS
2.0000 | ORAL_TABLET | ORAL | Status: DC
  Administered 2023-08-04 – 2023-08-06 (×3): 2 via ORAL
  Filled 2023-08-04 (×3): qty 2

## 2023-08-04 MED ORDER — ZOLPIDEM TARTRATE 5 MG PO TABS: 5.0000 mg | ORAL_TABLET | Freq: Every evening | ORAL | Status: DC | PRN

## 2023-08-04 MED ORDER — IBUPROFEN 600 MG PO TABS
600.0000 mg | ORAL_TABLET | Freq: Four times a day (QID) | ORAL | Status: DC
  Administered 2023-08-04 – 2023-08-06 (×9): 600 mg via ORAL
  Filled 2023-08-04 (×10): qty 1

## 2023-08-04 MED ORDER — BENZOCAINE-MENTHOL 20-0.5 % EX AERO: 1.0000 | INHALATION_SPRAY | CUTANEOUS | Status: DC | PRN

## 2023-08-04 MED ORDER — WITCH HAZEL-GLYCERIN EX PADS: 1.0000 | MEDICATED_PAD | CUTANEOUS | Status: DC | PRN

## 2023-08-04 MED ORDER — ACETAMINOPHEN 325 MG PO TABS
650.0000 mg | ORAL_TABLET | ORAL | Status: DC | PRN
  Administered 2023-08-04: 650 mg via ORAL
  Filled 2023-08-04: qty 2

## 2023-08-04 MED ORDER — SIMETHICONE 80 MG PO CHEW
80.0000 mg | CHEWABLE_TABLET | ORAL | Status: DC | PRN
  Administered 2023-08-05: 80 mg via ORAL
  Filled 2023-08-04: qty 1

## 2023-08-04 MED ORDER — PRENATAL MULTIVITAMIN CH
1.0000 | ORAL_TABLET | Freq: Every day | ORAL | Status: DC
  Administered 2023-08-04 – 2023-08-06 (×3): 1 via ORAL
  Filled 2023-08-04 (×3): qty 1

## 2023-08-04 MED ORDER — SODIUM CHLORIDE 0.9% FLUSH
3.0000 mL | Freq: Two times a day (BID) | INTRAVENOUS | Status: DC
  Administered 2023-08-04 – 2023-08-05 (×2): 3 mL via INTRAVENOUS

## 2023-08-04 MED ORDER — COCONUT OIL OIL: 1.0000 | TOPICAL_OIL | Status: DC | PRN

## 2023-08-04 MED ORDER — TRANEXAMIC ACID-NACL 1000-0.7 MG/100ML-% IV SOLN
INTRAVENOUS | Status: AC
  Filled 2023-08-04: qty 100

## 2023-08-04 MED ORDER — SODIUM CHLORIDE 0.9% FLUSH: 3.0000 mL | INTRAVENOUS | Status: DC | PRN

## 2023-08-04 MED ORDER — LACTATED RINGERS AMNIOINFUSION: INTRAVENOUS | Status: DC

## 2023-08-04 MED ORDER — NIFEDIPINE ER OSMOTIC RELEASE 30 MG PO TB24
30.0000 mg | ORAL_TABLET | Freq: Every day | ORAL | Status: DC
  Administered 2023-08-04 – 2023-08-06 (×3): 30 mg via ORAL
  Filled 2023-08-04 (×3): qty 1

## 2023-08-04 MED ORDER — DIPHENHYDRAMINE HCL 25 MG PO CAPS: 25.0000 mg | ORAL_CAPSULE | Freq: Four times a day (QID) | ORAL | Status: DC | PRN

## 2023-08-04 MED ORDER — ONDANSETRON HCL 4 MG/2ML IJ SOLN: 4.0000 mg | INTRAMUSCULAR | Status: DC | PRN

## 2023-08-04 MED ORDER — DIBUCAINE (PERIANAL) 1 % EX OINT: 1.0000 | TOPICAL_OINTMENT | CUTANEOUS | Status: DC | PRN

## 2023-08-04 MED ORDER — FUROSEMIDE 20 MG PO TABS
20.0000 mg | ORAL_TABLET | Freq: Every day | ORAL | Status: DC
  Administered 2023-08-04 – 2023-08-06 (×3): 20 mg via ORAL
  Filled 2023-08-04 (×3): qty 1

## 2023-08-04 MED ORDER — ONDANSETRON HCL 4 MG PO TABS: 4.0000 mg | ORAL_TABLET | ORAL | Status: DC | PRN

## 2023-08-04 MED ORDER — SODIUM CHLORIDE 0.9 % IV SOLN: 250.0000 mL | INTRAVENOUS | Status: DC | PRN

## 2023-08-04 NOTE — Progress Notes (Signed)
Pt meeting criteria for postpartum HTN treatment - started procardia 30 daily

## 2023-08-04 NOTE — Progress Notes (Signed)
Patient with elevated BP x2 this morning. Dr. Rexene Alberts made aware and started patient on procardia daily. Patient denies headache, blurry vision or epigastric pain. See new orders.   Ladona Ridgel, RN

## 2023-08-04 NOTE — Anesthesia Postprocedure Evaluation (Signed)
Anesthesia Post Note  Patient: Shannon Ruiz  Procedure(s) Performed: AN AD HOC LABOR EPIDURAL     Patient location during evaluation: Mother Baby Anesthesia Type: Epidural Level of consciousness: awake and alert and oriented Pain management: satisfactory to patient Vital Signs Assessment: post-procedure vital signs reviewed and stable Respiratory status: respiratory function stable Cardiovascular status: stable Postop Assessment: no headache, no backache, epidural receding, patient able to bend at knees, no signs of nausea or vomiting, adequate PO intake and able to ambulate Anesthetic complications: no   No notable events documented.  Last Vitals:  Vitals:   08/04/23 0320 08/04/23 0730  BP: 129/82 133/87  Pulse: 81 69  Resp: 17 18  Temp: 36.7 C 36.8 C  SpO2: 98% 99%    Last Pain:  Vitals:   08/04/23 0730  TempSrc: Oral  PainSc: 0-No pain   Pain Goal:                   Melva Faux

## 2023-08-04 NOTE — Discharge Summary (Signed)
Postpartum Discharge Summary  Date of Service updated***     Patient Name: Shannon Ruiz DOB: 04-25-1996 MRN: [id number]  Date of admission: 08/03/2023 Delivery date:08/04/2023 Delivering provider: Myrtie Hawk Date of discharge: 08/04/2023  Admitting diagnosis: Gestational hypertension [O13.9] Intrauterine pregnancy: [redacted]w[redacted]d     Secondary diagnosis:  Principal Problem:   Vaginal delivery Active Problems:   Anxiety disorder affecting pregnancy, antepartum   Supervision of other high risk pregnancy, antepartum, unspecified trimester   History of postpartum hemorrhage   Gestational hypertension  Additional problems: ***    Discharge diagnosis: Term Pregnancy Delivered and Gestational Hypertension                                              Post partum procedures:{Postpartum procedures:23558} Augmentation: AROM and Pitocin Complications: None  Hospital course: Induction of Labor With Vaginal Delivery   27 y.o. yo [id number] at [redacted]w[redacted]d was admitted to the hospital 08/03/2023 for induction of labor.  Indication for induction: Gestational hypertension.  Patient had an labor course complicated by none Membrane Rupture Time/Date: 11:50 PM,08/03/2023  Delivery Method:Vaginal, Spontaneous Operative Delivery:N/A Episiotomy: None Lacerations:  Periurethral Details of delivery can be found in separate delivery note.  Patient had a postpartum course complicated by***. Patient is discharged home 08/04/23.  Newborn Data: Birth date:08/04/2023 Birth time:12:23 AM Gender:Female Living status:Living Apgars:8 ,9  Weight:   Magnesium Sulfate received: {Mag received:30440022} BMZ received: No Rhophylac:N/A MMR:N/A T-DaP:Given prenatally Flu: No Transfusion:{Transfusion received:30440034}  Physical exam  Vitals:   08/04/23 0045 08/04/23 0100 08/04/23 0107 08/04/23 0115  BP: (!) 144/75 139/86  133/83  Pulse: 78 74  71  Resp:   16   Temp:      TempSrc:      SpO2: 97%     Weight:       Height:       General: {Exam; general:21111117} Lochia: {Desc; appropriate/inappropriate:30686::"appropriate"} Uterine Fundus: {Desc; firm/soft:30687} Incision: {Exam; incision:21111123} DVT Evaluation: {Exam; dvt:2111122} Labs: Lab Results  Component Value Date   WBC 20.1 (H) 08/03/2023   HGB 11.2 (L) 08/03/2023   HCT 33.8 (L) 08/03/2023   MCV 81.1 08/03/2023   PLT 378 08/03/2023      Latest Ref Rng & Units 08/03/2023    2:35 PM  CMP  Glucose 70 - 99 mg/dL 147   BUN 6 - 20 mg/dL <5   Creatinine 8.29 - 1.00 mg/dL 5.62   Sodium 130 - 865 mmol/L 139   Potassium 3.5 - 5.1 mmol/L 2.8   Chloride 98 - 111 mmol/L 108   CO2 22 - 32 mmol/L 19   Calcium 8.9 - 10.3 mg/dL 8.9   Total Protein 6.5 - 8.1 g/dL 6.3   Total Bilirubin 0.3 - 1.2 mg/dL 0.3   Alkaline Phos 38 - 126 U/L 108   AST 15 - 41 U/L 16   ALT 0 - 44 U/L 11    Edinburgh Score:    04/20/2022   11:26 AM  Edinburgh Postnatal Depression Scale Screening Tool  I have been able to laugh and see the funny side of things. 0  I have looked forward with enjoyment to things. 0  I have blamed myself unnecessarily when things went wrong. 0  I have been anxious or worried for no good reason. 0  I have felt scared or panicky for no good reason. 0  Things have been getting  on top of me. 1  I have been so unhappy that I have had difficulty sleeping. 0  I have felt sad or miserable. 0  I have been so unhappy that I have been crying. 0  The thought of harming myself has occurred to me. 0  Edinburgh Postnatal Depression Scale Total 1     After visit meds:  Allergies as of 08/04/2023   No Known Allergies   Med Rec must be completed prior to using this [facility]***        Discharge home in stable condition Infant Feeding: {Baby feeding:23562} Infant Disposition:{CHL IP OB HOME WITH WUJWJX:91478} Discharge instruction: per After Visit Summary and Postpartum booklet. Activity: Advance as tolerated. Pelvic rest for 6 weeks.   Diet: {OB GNFA:21308657} Future Appointments: Future Appointments  Date Time Provider Department Center  08/31/2023 11:15 AM Levie Heritage, DO [facility] None   Follow up Visit: Message sent to [facility] 8/9  Please schedule this patient for a In person postpartum visit in 6 weeks with the following provider: Any provider. Additional Postpartum F/U:Postpartum Depression checkup, 2 hour GTT, and BP check 1 week  High risk pregnancy complicated by: GDM and HTN Delivery mode:  Vaginal, Spontaneous Anticipated Birth Control:   discuss at ppV   08/04/2023 Myrtie Hawk, DO

## 2023-08-04 NOTE — Progress Notes (Signed)
Labor Progress Note  Shannon Ruiz is a 27 y.o. [id number] at [redacted]w[redacted]d presented for IOL GHTN  S: Pt ready for AROM, s/p epidural.  Noted difficulty tracing contraction  O:  BP (!) 144/93   Pulse 77   Temp 98.9 F (37.2 C) (Oral)   Resp 16   Ht 5\' 3"  (1.6 m)   Wt 100.2 kg   LMP 11/03/2022   BMI 39.15 kg/m  EFM: 140 bpm/Moderate variability/ 15x15 accels/ None decels CAT: 1 Toco: regular, every 2-4 minutes   CVE: Dilation: 6 Effacement (%): 80 Station: -2 Presentation: Vertex Exam by:: Dr. Lanae Crumbly   A&P: 27 y.o. L2G4010 [redacted]w[redacted]d  here for IOL as above  #Labor: Progressing well.  AROM clear, IUPC placed.  Continue Pitocin 2 x 2 #Pain: Family/Friend support and Epidural  [name]: CAT 1 [name] negative # GHTN: PEC labs normal.  BP elevated, no severe range.  CTM # History of postpartum hemorrhage: Consider TXA at delivery.  Myrtie Hawk, DO FMOB Fellow, Faculty practice [facility], Center for [facility] 08/04/23  12:04 AM

## 2023-08-05 ENCOUNTER — Other Ambulatory Visit (HOSPITAL_COMMUNITY): Payer: Self-pay

## 2023-08-05 MED ORDER — ACETAMINOPHEN 325 MG PO TABS
650.0000 mg | ORAL_TABLET | ORAL | 0 refills | Status: DC | PRN
  Filled 2023-08-05: qty 100, 9d supply, fill #0

## 2023-08-05 MED ORDER — NIFEDIPINE ER 30 MG PO TB24
30.0000 mg | ORAL_TABLET | Freq: Every day | ORAL | 0 refills | Status: DC
  Filled 2023-08-05: qty 30, 30d supply, fill #0

## 2023-08-05 MED ORDER — IBUPROFEN 600 MG PO TABS
600.0000 mg | ORAL_TABLET | Freq: Four times a day (QID) | ORAL | 0 refills | Status: DC
  Filled 2023-08-05: qty 30, 8d supply, fill #0

## 2023-08-05 MED ORDER — FUROSEMIDE 20 MG PO TABS
20.0000 mg | ORAL_TABLET | Freq: Every day | ORAL | 0 refills | Status: DC
  Filled 2023-08-05: qty 5, 5d supply, fill #0

## 2023-08-05 NOTE — Progress Notes (Signed)
POSTPARTUM PROGRESS NOTE  Post Partum Day 1  Subjective:  Shannon Ruiz is a 27 y.o. [id number] s/p VD at [redacted]w[redacted]d.  She reports she is doing well. No acute events overnight. She denies any problems with ambulating, voiding or po intake. Denies nausea or vomiting.  Pain is well controlled.  Lochia is appropriate.  Objective: Blood pressure 128/83, pulse 87, temperature 98 F (36.7 C), resp. rate 16, height 5\' 3"  (1.6 m), weight 100.2 kg, last menstrual period 11/03/2022, SpO2 100%, unknown if currently breastfeeding.  Physical Exam:  Vitals:   08/04/23 2100 08/05/23 0547  BP: (!) 140/105 128/83  Pulse:  87  Resp:  16  Temp:  98 F (36.7 C)  SpO2:  100%    General: alert, cooperative and no distress Chest: no respiratory distress Heart:regular rate, distal pulses intact Abdomen: soft, nontender,  Uterine Fundus: firm, appropriately tender DVT Evaluation: No calf swelling or tenderness Extremities: No LE edema Skin: warm, dry  Recent Labs    08/03/23 2119 08/04/23 0524  HGB 11.2* 9.8*  HCT 33.8* 29.2*    Assessment/Plan: Shannon Ruiz is a 27 y.o. [id number] s/p VD at [redacted]w[redacted]d   PPD#1 - Doing well  Routine postpartum care gHTN- BP improving with procardia 30 lasix 20 Contraception: Discuss at [facility] visit Feeding: Breast Dispo: Plan for discharge 8/11.   LOS: 2 days   Lavonda Jumbo, DO OB Fellow, Faculty [facility], Center for [facility] 08/05/2023, 10:32 AM

## 2023-08-06 NOTE — Lactation Note (Signed)
This note was copied from a baby's chart. Lactation Consultation Note  Patient Name: Shannon Ruiz [id number] Date: 08/06/2023 Age:27 hours Reason for consult: Initial assessment;Term (Infant transferred from NICU).  LC entered the room, infant was sucking on pacifier, infant has not been latching well since returning to [facility] from NICU. Birth Parent attempted latch infant initially on her right breast using the football hold position, infant did not sustain latch, was on and off the breast probably due to using pacifier. LC fitted Birth Parent with 20 mm NS, infant started sustaining latch and BF for 12 minutes, afterwards infant was supplemented with clear and green slow flow nipple and consumed 20 mls of EBM that Birth Parent pumped. Infant appeared content after the feeding. Birth Parent will continue to use NS tonight as she work on infant latching at the breast and in morning will try to latch infant without NS and will delay using pacifier until 3 weeks postpartum.   Current feeding plan: 1- Apply NS, prior to latching infant at the breast, continue to BF infant by cues, on demand, 8 to 12+ times within 24 hours, skin to skin. 2- Call RN/LC for further latch assistance if needed or assitance with applying NS. 3- Birth Parent will continue to supplement infant with EBM as she works towards infant latching well at the breast and offer Day 3 ( 18-25 mls) of EBM or more if infant wants it. 4- Continue to use DEBP and give infant back EBM that is pumped.  Maternal Data Has patient been taught Hand Expression?: Yes Does the patient have breastfeeding experience prior to this delivery?: Yes How Dileo did the patient breastfeed?: Birth Parent BF 1st child for few months and 2nd child for 6 months who is currently 18 months.  Feeding Mother's Current Feeding Choice: Breast Milk and Donor Milk  LATCH Score Latch: Repeated attempts needed to sustain latch, nipple held in mouth throughout feeding,  stimulation needed to elicit sucking reflex. (Infant started sustaining latch with 20 mm NS.)  Audible Swallowing: Spontaneous and intermittent  Type of Nipple: Everted at rest and after stimulation  Comfort (Breast/Nipple): Soft / non-tender  Hold (Positioning): Assistance needed to correctly position infant at breast and maintain latch.  LATCH Score: 8   Lactation Tools Discussed/Used Tools: Pump Breast pump type: Double-Electric Breast Pump Reason for Pumping: Birth Parent will continue to use DEBP every 3 hours for 15 minutes. Pumping frequency: Birth Parent will continue to use DEBP every 3 hours for 15 minutes on inital setting due to using 20 mm NS. Pumped volume: 15 mL  Interventions Interventions: Position options;Support pillows;Assisted with latch;Breast feeding basics reviewed;Adjust position;Skin to skin;Expressed milk;Breast compression;LC Services brochure;Education  Discharge Pump: DEBP;Hands Free;Personal  Consult Status Consult Status: Follow-up Date: 08/06/23 Follow-up type: In-patient    Frederico Hamman 08/06/2023, 12:26 AM

## 2023-08-06 NOTE — Progress Notes (Signed)
RN was called to bedside at 0620 to observe a clot passed by patient. Clot appeared to contain placental material and clotted blood. Due to patient's history of PPH at home due to retained placenta, RN called on call faculty provider to bedside. Provider stated that he would be en route to see the patient soon. Patient updated.

## 2023-08-08 ENCOUNTER — Telehealth: Payer: Self-pay

## 2023-08-08 NOTE — Telephone Encounter (Signed)
Patient called stating she checked her BP 3 times and all 3 readings were elevated. Patient states her BP was 153/107, 151/107, and 142/111. Advised patient to go to [facility] to be seen. Patient denies blurry vision and headache.Patient states she does not have a babysitter and would like to move her Friday appointment up. Rescheduled patient's BP/mood check to 08/09/23. Advised patient to go to [facility]U if she develops a headache, blurry vision or starts seeing spots. Understanding was voiced. Chantrice Hagg l Fines Kimberlin, CMA

## 2023-08-09 ENCOUNTER — Ambulatory Visit (INDEPENDENT_AMBULATORY_CARE_PROVIDER_SITE_OTHER): Payer: Medicaid Other

## 2023-08-09 VITALS — BP 138/89 | HR 82 | Wt 207.0 lb

## 2023-08-09 DIAGNOSIS — Z0130 Encounter for examination of blood pressure without abnormal findings: Secondary | ICD-10-CM

## 2023-08-09 NOTE — Progress Notes (Signed)
Patient presents for BP check. Patient is 3 days PP. Patient called the office yesterday stating she had 3 elevated BPs.  Patient states she could not go to [facility]U and she requested to move up Friday's BP check appt.  Subjective:  Shannon Ruiz is a 27 y.o. female here for BP check.   Hypertension ROS: taking medications as instructed, no medication side effects noted, no TIA's, no chest pain on exertion, no dyspnea on exertion, and no swelling of ankles.    Objective:  BP 138/89   Pulse 82   Wt 207 lb (93.9 kg)   LMP 11/03/2022   Breastfeeding Yes   BMI 36.67 kg/m   Appearance alert, well appearing, and in no distress. General exam BP noted to be well controlled today in office.    Assessment:   Blood Pressure improved.   Plan:  Current treatment plan is effective, no change in therapy.  l , CMA .

## 2023-08-10 ENCOUNTER — Encounter: Payer: Medicaid Other | Admitting: Family Medicine

## 2023-08-11 ENCOUNTER — Ambulatory Visit (INDEPENDENT_AMBULATORY_CARE_PROVIDER_SITE_OTHER): Payer: Medicaid Other | Admitting: Obstetrics and Gynecology

## 2023-08-11 ENCOUNTER — Encounter: Payer: Self-pay | Admitting: Obstetrics and Gynecology

## 2023-08-11 VITALS — BP 138/104 | HR 101 | Ht 63.0 in | Wt 204.0 lb

## 2023-08-11 DIAGNOSIS — Z8759 Personal history of other complications of pregnancy, childbirth and the puerperium: Secondary | ICD-10-CM | POA: Diagnosis not present

## 2023-08-11 DIAGNOSIS — Z0130 Encounter for examination of blood pressure without abnormal findings: Secondary | ICD-10-CM

## 2023-08-11 DIAGNOSIS — F419 Anxiety disorder, unspecified: Secondary | ICD-10-CM | POA: Diagnosis not present

## 2023-08-11 NOTE — Progress Notes (Signed)
Patient presents for BP check and PP depression check. Inocente Salles score:4)Mattheo Swindle Edrick Kins

## 2023-08-11 NOTE — Progress Notes (Signed)
POSTPARTUM VISIT  Patient name: Anne-Louise Fiebig MRN [id number]  Date of birth: April 11, 1996 Chief Complaint:   Vaginal Bleeding   History:  Averii Pavy is a 27 y.o. [id number] being seen today for PP visit re: BP and light vaginal bleeding.  Not heavy, may pass a small clot. Notices it primarily with wiping and after lying down for a while - most will be in the morning when she wakes up. No abnormal discharge. No abdominal pain. No burning with urination. No burning with urination. Plans on starting patch eventually for PP contraception. Has not taken BP meds yet today - typically takes around noon. Anxious presently due to prior delivery being complicated by PP hemorrhage 8d PP when she noticed she was having more bleeding. Reports with this delivery that they looked at placenta and placenta intact and had minimal blood loss. She just wants to avoid repeat of prior experience.   Past Medical History:  Diagnosis Date   Anemia    Anxiety    Depression    Supervision of other high risk pregnancy, antepartum, unspecified trimester 01/09/2021   Needs PP MMR    Past Surgical History:  Procedure Laterality Date   NO PAST SURGERIES      The following portions of the patient's history were reviewed and updated as appropriate: allergies, current medications, past family history, past medical history, past social history, past surgical history and problem list.   Health Maintenance:   Last pap     Component Value Date/Time   DIAGPAP  01/25/2023 1027    - Negative for intraepithelial lesion or malignancy (NILM)   DIAGPAP  06/30/2020 1124    - Negative for intraepithelial lesion or malignancy (NILM)   ADEQPAP  01/25/2023 1027    Satisfactory for evaluation; transformation zone component PRESENT.   ADEQPAP  06/30/2020 1124    Satisfactory for evaluation; transformation zone component PRESENT.    High Risk HPV: Positive  Adequacy:  Satisfactory for evaluation, transformation zone component  PRESENT  Diagnosis:  Atypical squamous cells of undetermined significance (ASC-US)  Last mammogram: n/a   Review of Systems:  Pertinent items are noted in HPI. Comprehensive review of systems was otherwise negative.   Objective:  Physical Exam BP (!) 138/104 Comment: Patient has not taken blood pressure med today  Pulse (!) 101   Ht 5\' 3"  (1.6 m)   Wt 204 lb (92.5 kg)   LMP 11/03/2022   Breastfeeding Yes   BMI 36.14 kg/m    Physical Exam Vitals and nursing note reviewed.  Constitutional:      Appearance: Normal appearance.  HENT:     Head: Normocephalic and atraumatic.  Pulmonary:     Effort: Pulmonary effort is normal.  Abdominal:     Comments: Soft Fundus low and firm  Skin:    General: Skin is warm and dry.  Neurological:     General: No focal deficit present.     Mental Status: She is alert.  Psychiatric:        Mood and Affect: Mood normal.        Behavior: Behavior normal.        Thought Content: Thought content normal.        Judgment: Judgment normal.           Assessment & Plan:   1. History of gestational hypertension 2. BP check Elevated BP today, has not taken BP med, recommend continue BP meds and check at home. Review of prior BP check  normal   3. History of postpartum hemorrhage Likely lochia today. Recommend representation to care if bleeding becomes heavier today or develops abdominal pain, fever, chills, etc  4. Anxiety Continue medication and validated anxiety given prior experience. Provided reassurance and plan in case of increased bleeding.    Routine preventative health maintenance measures emphasized.  Lorriane Shire, MD Minimally Invasive Gynecologic Surgery [facility], [facility]

## 2023-08-30 ENCOUNTER — Ambulatory Visit: Payer: Medicaid Other | Admitting: Family Medicine

## 2023-08-31 ENCOUNTER — Ambulatory Visit: Payer: Medicaid Other | Admitting: Family Medicine

## 2023-08-31 MED ORDER — CITALOPRAM HYDROBROMIDE 10 MG PO TABS
10.0000 mg | ORAL_TABLET | Freq: Every day | ORAL | 3 refills | Status: DC
Start: 2023-08-31 — End: 2023-09-20

## 2023-08-31 NOTE — Telephone Encounter (Signed)
-----   Message from Nurse Liberty Handy sent at 08/30/2023  9:22 AM EDT ----- Regarding: FW: Meds  ----- Message ----- From: Talbert Cage Sent: 08/30/2023   9:19 AM EDT To: [facility] Subject: Meds                                           Patient called to reschedule appointment but also had a question about her medications.   She wanted to know if she still needed to take her blood pressure medication and she also needs a refill on her Celexa.   Best contact number is [phone].

## 2023-09-20 ENCOUNTER — Encounter: Payer: Self-pay | Admitting: Family Medicine

## 2023-09-20 ENCOUNTER — Ambulatory Visit: Payer: Medicaid Other | Admitting: Family Medicine

## 2023-09-20 DIAGNOSIS — F419 Anxiety disorder, unspecified: Secondary | ICD-10-CM

## 2023-09-20 MED ORDER — CITALOPRAM HYDROBROMIDE 10 MG PO TABS
10.0000 mg | ORAL_TABLET | Freq: Every day | ORAL | 3 refills | Status: DC
Start: 2023-09-20 — End: 2024-07-05

## 2023-09-20 MED ORDER — PHEXXI 1.8-1-0.4 % VA GEL: 1.0000 | VAGINAL | 11 refills | Status: DC | PRN

## 2023-09-20 NOTE — Progress Notes (Signed)
Post Partum Visit Note  Shannon Ruiz is a 27 y.o. [phone] female who presents for a postpartum visit. She is 6 weeks postpartum following a normal spontaneous vaginal delivery.  I have fully reviewed the prenatal and intrapartum course. The delivery was at 39 gestational weeks.  Anesthesia: epidural. Postpartum course has been normal. Baby is doing well. Baby is feeding by breast. Bleeding no bleeding. Bowel function is normal. Bladder function is normal. Patient is not sexually active. Contraception method is  phexxi . Postpartum depression screening: negative.   The pregnancy intention screening data noted above was reviewed. Potential methods of contraception were discussed. The patient elected to proceed with No data recorded.   Edinburgh Postnatal Depression Scale - 09/20/23 0851       Edinburgh Postnatal Depression Scale:  In the Past 7 Days   I have been able to laugh and see the funny side of things. 0    I have looked forward with enjoyment to things. 0    I have blamed myself unnecessarily when things went wrong. 0    I have been anxious or worried for no good reason. 0    I have felt scared or panicky for no good reason. 0    Things have been getting on top of me. 0    I have been so unhappy that I have had difficulty sleeping. 0    I have felt sad or miserable. 0    I have been so unhappy that I have been crying. 0    The thought of harming myself has occurred to me. 0    Edinburgh Postnatal Depression Scale Total 0             Health Maintenance Due  Topic Date Due   INFLUENZA VACCINE  Never done   COVID-19 Vaccine (2 - 2023-24 season) 08/27/2023    The following portions of the patient's history were reviewed and updated as appropriate: allergies, current medications, past family history, past medical history, past social history, past surgical history, and problem list.  Review of Systems Pertinent items are noted in HPI.  Objective:  BP 126/89   Pulse 87    Wt 202 lb (91.6 kg)   LMP 11/03/2022   Breastfeeding Yes   BMI 35.78 kg/m    General:  alert, cooperative, and no distress   Breasts:  not indicated  Lungs: clear to auscultation bilaterally  Heart:  regular rate and rhythm, S1, S2 normal, no murmur, click, rub or gallop  Abdomen: soft, non-tender; bowel sounds normal; no masses,  no organomegaly   Wound N/a  GU exam:  not indicated       Assessment:    1. Postpartum care and examination  2. GHTN BP normal  3. H/o GDM Return in 6 weeks for HgA1c  Plan:   Essential components of care per ACOG recommendations:  1.  Mood and well being: Patient with negative depression screening today. Reviewed local resources for support.  - Patient tobacco use? No.   - hx of drug use? No.    2. Infant care and feeding:  -Patient currently breastmilk feeding? Yes. Reviewed importance of draining breast regularly to support lactation.  -Social determinants of health (SDOH) reviewed in EPIC. No concerns  3. Sexuality, contraception and birth spacing - Patient does not want a pregnancy in the next year.   - Reviewed reproductive life planning. Reviewed contraceptive methods based on pt preferences and effectiveness.  Patient desired Spermicide (used  alone) today.   - Discussed birth spacing of 18 months  4. Sleep and fatigue -Encouraged family/partner/community support of 4 hrs of uninterrupted sleep to help with mood and fatigue  5. Physical Recovery  - Discussed patients delivery and complications. She describes her labor as good. - Patient had a Vaginal, no problems at delivery. - Patient has urinary incontinence? No. - Patient is safe to resume physical and sexual activity  6.  Health Maintenance - HM due items addressed Yes - Last pap smear  Diagnosis  Date Value Ref Range Status  01/25/2023   Final   - Negative for intraepithelial lesion or malignancy (NILM)   Pap smear not done at today's visit.  -Breast Cancer screening  indicated? No.   7. Chronic Disease/Pregnancy Condition follow up: None  - PCP follow up  Levie Heritage, DO [facility], [facility]

## 2023-11-01 ENCOUNTER — Ambulatory Visit: Payer: Medicaid Other | Admitting: Family Medicine

## 2024-01-25 ENCOUNTER — Ambulatory Visit: Payer: Medicaid Other | Admitting: Family Medicine

## 2024-01-25 VITALS — BP 117/74 | HR 96 | Ht 63.0 in | Wt 202.0 lb

## 2024-01-25 DIAGNOSIS — Z30017 Encounter for initial prescription of implantable subdermal contraceptive: Secondary | ICD-10-CM

## 2024-01-25 MED ORDER — CLINDAMYCIN PHOSPHATE 1 % EX GEL: Freq: Two times a day (BID) | CUTANEOUS | 11 refills | Status: DC

## 2024-01-25 MED ORDER — ETONOGESTREL 68 MG ~~LOC~~ IMPL
68.0000 mg | DRUG_IMPLANT | Freq: Once | SUBCUTANEOUS | Status: AC
Start: 2024-01-25 — End: 2024-01-25
  Administered 2024-01-25: 68 mg via SUBCUTANEOUS

## 2024-01-25 NOTE — Progress Notes (Signed)
Nexplanon Insertion:  Patient given informed consent, signed copy in the chart, time out was performed. Pregnancy test was negative. Appropriate time out taken.  Patient's left arm was prepped and draped in the usual sterile fashion.. The ruler used to measure and mark insertion area.  Pt was prepped with alcohol swab and then injected with 3 cc of 2% lidocaine with epinephrine.  Pt was prepped with betadine, Implanon removed form packaging,  Device confirmed in needle, then inserted full length of needle and withdrawn per handbook instructions.  Device palpated by physician and patient.  Pt insertion site covered with pressure dressing.   Minimal blood loss.  Pt tolerated the procedure well.

## 2024-02-19 ENCOUNTER — Encounter: Payer: Self-pay | Admitting: Family Medicine

## 2024-02-22 ENCOUNTER — Telehealth: Payer: Self-pay

## 2024-02-22 NOTE — Telephone Encounter (Signed)
 Attempted to contact patient multiple times.  No returned call.  Me

## 2024-02-28 ENCOUNTER — Ambulatory Visit

## 2024-02-29 ENCOUNTER — Ambulatory Visit

## 2024-02-29 DIAGNOSIS — Z3046 Encounter for surveillance of implantable subdermal contraceptive: Secondary | ICD-10-CM

## 2024-02-29 NOTE — Progress Notes (Signed)
 Patient concerned that Nexplenon is fractured.  Examined site and no evidence of any abnormalities.  Explained that what she was feeling was subcutaneous tissue.  Danna Hefty [employer]

## 2024-03-12 ENCOUNTER — Telehealth: Payer: Self-pay

## 2024-03-12 NOTE — Telephone Encounter (Signed)
 Patient called asking which allergy medication is safe to take while breast feeding. Informed patient that  Zyrtec and Claritin are both safe to take while breast feeding. Patient states that she will take Allegra because that is what she has available.  She also asked is Celexa safe. Patient made aware that Celexa is safe but it can cause doziness in babies. Understanding was voiced. Shannon Ruiz l Kenwood Rosiak, CMA

## 2024-03-17 ENCOUNTER — Telehealth: Admitting: Family

## 2024-03-17 DIAGNOSIS — N61 Mastitis without abscess: Secondary | ICD-10-CM | POA: Diagnosis not present

## 2024-03-17 MED ORDER — CEPHALEXIN 500 MG PO CAPS
500.0000 mg | ORAL_CAPSULE | Freq: Four times a day (QID) | ORAL | 0 refills | Status: AC
Start: 2024-03-17 — End: 2024-03-24

## 2024-03-17 NOTE — Progress Notes (Signed)
E Visit for Cellulitis ° °We are sorry that you are not feeling well. Here is how we plan to help! ° °Based on what you shared with me it looks like you have cellulitis.  Cellulitis looks like areas of skin redness, swelling, and warmth; it develops as a result of bacteria entering under the skin. Little red spots and/or bleeding can be seen in skin, and tiny surface sacs containing fluid can occur. Fever can be present. Cellulitis is almost always on one side of a body, and the lower limbs are the most common site of involvement.  ° °I have prescribed:  Keflex 500mg take one by mouth four times a day for 7 days. ° °HOME CARE: ° °• Take your medications as ordered and take all of them, even if the skin irritation appears to be healing.  ° °GET HELP RIGHT AWAY IF: ° °• Symptoms that don't begin to go away within 48 hours. °• Severe redness persists or worsens °• If the area turns color, spreads or swells. °• If it blisters and opens, develops yellow-brown crust or bleeds. °• You develop a fever or chills. °• If the pain increases or becomes unbearable.  °• Are unable to keep fluids and food down. ° °MAKE SURE YOU  ° °· Understand these instructions. °· Will watch your condition. °· Will get help right away if you are not doing well or get worse. ° °Thank you for choosing an e-visit. °Your e-visit answers were reviewed by a board certified advanced clinical practitioner to complete your personal care plan. Depending upon the condition, your plan could have included both over the counter or prescription medications. °Please review your pharmacy choice. Make sure the pharmacy is open so you can pick up prescription now. If there is a problem, you may contact your provider through MyChart messaging and have the prescription routed to another pharmacy. °Your safety is important to us. If you have drug allergies check your prescription carefully.  °For the next 24 hours you can use MyChart to ask questions about today's  visit, request a non-urgent call back, or ask for a work or school excuse. °You will get an email in the next two days asking about your experience. I hope that your e-visit has been valuable and will speed your recovery. ° °Approximately 5 minutes was spent documenting and reviewing patient's chart.  ° °

## 2024-06-29 ENCOUNTER — Other Ambulatory Visit: Payer: Self-pay | Admitting: Family Medicine

## 2024-06-29 DIAGNOSIS — F419 Anxiety disorder, unspecified: Secondary | ICD-10-CM

## 2024-08-22 ENCOUNTER — Telehealth: Admitting: Physician Assistant

## 2024-08-22 DIAGNOSIS — F411 Generalized anxiety disorder: Secondary | ICD-10-CM

## 2024-08-22 MED ORDER — CITALOPRAM HYDROBROMIDE 20 MG PO TABS: 20.0000 mg | ORAL_TABLET | Freq: Every day | ORAL | 0 refills | Status: DC

## 2024-08-22 NOTE — Patient Instructions (Addendum)
  Shannon Ruiz, thank you for joining Shannon Velma Lunger, PA-C for today's virtual visit.  While this provider is not your primary care provider (PCP), if your PCP is located in our provider database this encounter information will be shared with them immediately following your visit.   A [facility] MyChart account gives you access to today's visit and all your visits, tests, and labs performed at [facility]  click here if you don't have a [facility] MyChart account or go to [email or web address]  Consent: (Patient) Shannon Ruiz provided verbal consent for this virtual visit at the beginning of the encounter.  Current Medications:  Current Outpatient Medications:    citalopram  (CELEXA ) 10 MG tablet, TAKE 1 TABLET BY MOUTH EVERY DAY, Disp: 90 tablet, Rfl: 3   clindamycin  (CLINDAGEL) 1 % gel, Apply topically 2 (two) times daily., Disp: 30 g, Rfl: 11   Lactic Ac-Citric Ac-Pot Bitart (PHEXXI ) 1.8-1-0.4 % GEL, Place 1 Application vaginally as needed. (Patient not taking: Reported on 02/29/2024), Disp: 5 g, Rfl: 11   Medications ordered in this encounter:  No orders of the defined types were placed in this encounter.    *If you need refills on other medications prior to your next appointment, please contact your pharmacy*  Follow-Up: Call back or seek an in-person evaluation if the symptoms worsen or if the condition fails to improve as anticipated.  [facility] [phone]  Other Instructions Start new dose of medication as discussed. Call Dr. Tiffany office to schedule follow-up within 3-4 weeks. See resources below for counseling. If you note any non-resolving, new, or worsening symptoms despite treatment, please seek an in-person evaluation ASAP.   Counseling Services:  [facility] [phone]  [facility] - [phone]  [facility] -- [phone]  [facility] -- [phone]  [facility] -- [phone]  [facility] -- [phone]  [facility] - [phone]  [facility] -- [phone]  [facility] [place], [facility] - [phone]  [facility]  [facility] -- [phone]  Mental Health Resources  Emergency Numbers:  26 Suicide and Crisis Lifeline (Formerly National Suicide Prevention Lifeline)  O Call or SMS text 988  O For Deaf or Hard-of-Hearing-- Use preferred relay service or dial 711 and then  988  O For Spanish-speaking patients -- I -[phone]  [employer] or SMS text [phone]  Disaster Distress [place] [facility] Call,Text [phone]  National Domestic Violence Hotline  O call (615)414-9632 or Text START to 2538724658  National Sexual Assault Hotline  o call (769)247-8709      If you have been instructed to have an in-person evaluation today at a local Urgent Care facility, please use the link below. It will take you to a list of all of our available [facility], including address, phone number and hours of operation. Please do not delay care.  [facility]  If you or a family member do not have a primary care provider, use the link below to schedule a visit and establish care. When you choose a [facility] primary care physician or advanced practice provider, you gain a [facility]-term partner in health. Find a Primary Care Provider  Learn more about [facility] in-office and virtual care options:  - Get Care Now

## 2024-08-22 NOTE — Progress Notes (Signed)
 Virtual Visit Consent   Shannon Ruiz, you are scheduled for a virtual visit with a [facility] provider today. Just as with appointments in the office, your consent must be obtained to participate. Your consent will be active for this visit and any virtual visit you may have with one of our providers in the next 365 days. If you have a MyChart account, a copy of this consent can be sent to you electronically.  As this is a virtual visit, video technology does not allow for your provider to perform a traditional examination. This may limit your provider's ability to fully assess your condition. If your provider identifies any concerns that need to be evaluated in person or the need to arrange testing (such as labs, EKG, etc.), we will make arrangements to do so. Although advances in technology are sophisticated, we cannot ensure that it will always work on either your end or our end. If the connection with a video visit is poor, the visit may have to be switched to a telephone visit. With either a video or telephone visit, we are not always able to ensure that we have a secure connection.  By engaging in this virtual visit, you consent to the provision of healthcare and authorize for your insurance to be billed (if applicable) for the services provided during this visit. Depending on your insurance coverage, you may receive a charge related to this service.  I need to obtain your verbal consent now. Are you willing to proceed with your visit today? Shannon Ruiz has provided verbal consent on 08/22/2024 for a virtual visit (video or telephone). Shannon Ruiz, [place]  Date: 08/22/2024 2:43 PM   Virtual Visit via Video Note   I, Shannon Ruiz, connected with  Shannon Ruiz  ([id number], 05-31-1996) on 08/22/24 at  2:30 PM EDT by a video-enabled telemedicine application and verified that I am speaking with the correct person using two identifiers.  Location: Patient: Virtual Visit Location Patient:  Home Provider: Virtual Visit Location Provider: Home Office   I discussed the limitations of evaluation and management by telemedicine and the availability of in person appointments. The patient expressed understanding and agreed to proceed.    History of Present Illness: Shannon Ruiz is a 28 y.o. who identifies as a female who was assigned female at birth, and is being seen today for increased levels of anxiety over the past few months, not controlled with her 10 mg Celexa  prescribed by her GYN. Notes she was placed on this medication after birth of her second child after having medical complications that resulted in health anxiety. Has been doing well on it since that time until recently. 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 Denies depressed mood, SI/HI. Sleep impacted mainly from her small children (3 children under 22 years old). Does not have PCP or [facility] provider.  HPI: HPI  Problems:  Patient Active Problem List   Diagnosis Date Noted   Gestational hypertension 08/03/2023   Gestational diabetes mellitus (GDM) in third trimester 06/01/2023   Urinary tract infection affecting pregnancy 01/31/2023   History of IUGR (intrauterine growth retardation) and stillbirth, currently pregnant, third trimester 01/31/2023   History of postpartum hemorrhage 01/25/2023   History of gestational hypertension 03/14/2022   Former smoker 06/30/2020   Anxiety disorder affecting pregnancy, antepartum 06/30/2020    Allergies: No Known Allergies Medications:  Current Outpatient Medications:  citalopram  (CELEXA ) 20 MG tablet, Take 1 tablet (20 mg total) by mouth daily., Disp: 30 tablet, Rfl: 0   clindamycin  (CLINDAGEL) 1 % gel, Apply topically 2 (two) times daily., Disp: 30 g, Rfl: 11   Lactic Ac-Citric Ac-Pot Bitart (PHEXXI ) 1.8-1-0.4 % GEL, Place 1 Application vaginally as needed. (Patient not taking: Reported on  02/29/2024), Disp: 5 g, Rfl: 11  Observations/Objective: Patient is well-developed, well-nourished in no acute distress.  Resting comfortably  at home.  Head is normocephalic, atraumatic.  No labored breathing.  Speech is clear and coherent with logical content.  Patient is alert and oriented at baseline.   Assessment and Plan: 1. GAD (generalized anxiety disorder) (Primary) - citalopram  (CELEXA ) 20 MG tablet; Take 1 tablet (20 mg total) by mouth daily.  Dispense: 30 tablet; Refill: 0  Since she has been on this medication after birth of second child and maintained on it by her GYN while breastfeeding, ok to continue. Will increase to 20 mg daily. Resources for [facility] services given. She is to call GYN office to schedule follow-up within 3-4 weeks for reassessment and further management.  Follow Up Instructions: I discussed the assessment and treatment plan with the patient. The patient was provided an opportunity to ask questions and all were answered. The patient agreed with the plan and demonstrated an understanding of the instructions.  A copy of instructions were sent to the patient via MyChart unless otherwise noted below.   The patient was advised to call back or seek an in-person evaluation if the symptoms worsen or if the condition fails to improve as anticipated.    Shannon Velma Lunger, PA-C

## 2024-09-01 ENCOUNTER — Telehealth: Admitting: Family

## 2024-09-01 DIAGNOSIS — M545 Low back pain, unspecified: Secondary | ICD-10-CM

## 2024-09-01 MED ORDER — BACLOFEN 10 MG PO TABS: 10.0000 mg | ORAL_TABLET | Freq: Three times a day (TID) | ORAL | 0 refills | Status: DC

## 2024-09-01 MED ORDER — NAPROXEN 500 MG PO TABS: 500.0000 mg | ORAL_TABLET | Freq: Two times a day (BID) | ORAL | 0 refills | Status: DC

## 2024-09-01 NOTE — Progress Notes (Signed)

## 2024-09-01 NOTE — Progress Notes (Signed)
Approximately 5 minutes was spent documenting and reviewing patient's chart.

## 2024-09-01 NOTE — Addendum Note (Signed)
 Addended by: LAVELL LYE A on: 09/01/2024 06:35 PM   Modules accepted: Orders, Level of Service

## 2024-09-01 NOTE — Progress Notes (Signed)
  Because your back pain and possible UTI symptoms, I feel your condition warrants further evaluation and I recommend that you be seen in a face-to-face visit.   NOTE: There will be NO CHARGE for this E-Visit   If you are having a true medical emergency, please call 911.     For an urgent face to face visit, [facility] has multiple urgent care centers for your convenience.  Click the link below for the full list of locations and hours, walk-in wait times, appointment scheduling options and driving directions:  Urgent Care - [place], [place], [place], [place], [place], [place]  [place]     Your MyChart E-visit questionnaire answers were reviewed by a board certified advanced clinical practitioner to complete your personal care plan based on your specific symptoms.    Thank you for using e-Visits.

## 2024-09-02 ENCOUNTER — Other Ambulatory Visit: Payer: Self-pay

## 2024-09-02 ENCOUNTER — Encounter (HOSPITAL_BASED_OUTPATIENT_CLINIC_OR_DEPARTMENT_OTHER): Payer: Self-pay

## 2024-09-02 DIAGNOSIS — N132 Hydronephrosis with renal and ureteral calculous obstruction: Secondary | ICD-10-CM | POA: Diagnosis not present

## 2024-09-02 DIAGNOSIS — R1031 Right lower quadrant pain: Secondary | ICD-10-CM | POA: Diagnosis present

## 2024-09-02 LAB — URINALYSIS, ROUTINE W REFLEX MICROSCOPIC
Bilirubin Urine: NEGATIVE
Glucose, UA: NEGATIVE mg/dL
Hgb urine dipstick: NEGATIVE
Ketones, ur: NEGATIVE mg/dL
Leukocytes,Ua: NEGATIVE
Nitrite: NEGATIVE
Protein, ur: 30 mg/dL — AB
Specific Gravity, Urine: >=1.03 (ref 1.005–1.030)
pH: 6 (ref 5.0–8.0)

## 2024-09-02 LAB — CBC
HCT: 38.4 % (ref 36.0–46.0)
Hemoglobin: 12.9 g/dL (ref 12.0–15.0)
MCH: 28.3 pg (ref 26.0–34.0)
MCHC: 33.6 g/dL (ref 30.0–36.0)
MCV: 84.2 fL (ref 80.0–100.0)
Platelets: 527 K/uL — ABNORMAL HIGH (ref 150–400)
RBC: 4.56 MIL/uL (ref 3.87–5.11)
RDW: 13.3 % (ref 11.5–15.5)
WBC: 14 K/uL — ABNORMAL HIGH (ref 4.0–10.5)
nRBC: 0 % (ref 0.0–0.2)

## 2024-09-02 LAB — PREGNANCY, URINE: Preg Test, Ur: NEGATIVE

## 2024-09-02 NOTE — ED Triage Notes (Signed)
 Pt is coming in for right lower back pain that started several weeks ago, she says it has migrated to her groin and leg, no Hx of UTIs, no Hx of kidney stones. She mentions it hurts in her back when she she does urinate. She is otherwise stable at this time in triage with no other complaints.

## 2024-09-03 ENCOUNTER — Emergency Department (HOSPITAL_BASED_OUTPATIENT_CLINIC_OR_DEPARTMENT_OTHER)

## 2024-09-03 ENCOUNTER — Emergency Department (HOSPITAL_BASED_OUTPATIENT_CLINIC_OR_DEPARTMENT_OTHER)
Admission: EM | Admit: 2024-09-03 | Discharge: 2024-09-03 | Disposition: A | Discharge status: Home / Self Care | Attending: Emergency Medicine | Admitting: Emergency Medicine

## 2024-09-03 DIAGNOSIS — N201 Calculus of ureter: Secondary | ICD-10-CM

## 2024-09-03 LAB — COMPREHENSIVE METABOLIC PANEL WITH GFR
ALT: 13 U/L (ref 0–44)
AST: 14 U/L — ABNORMAL LOW (ref 15–41)
Albumin: 4.7 g/dL (ref 3.5–5.0)
Alkaline Phosphatase: 72 U/L (ref 38–126)
Anion gap: 15 (ref 5–15)
BUN: 13 mg/dL (ref 6–20)
CO2: 21 mmol/L — ABNORMAL LOW (ref 22–32)
Calcium: 9.3 mg/dL (ref 8.9–10.3)
Chloride: 104 mmol/L (ref 98–111)
Creatinine, Ser: 0.68 mg/dL (ref 0.44–1.00)
GFR, Estimated: >60 mL/min (ref >60)
Glucose, Bld: 97 mg/dL (ref 70–99)
Potassium: 3.6 mmol/L (ref 3.5–5.1)
Sodium: 139 mmol/L (ref 135–145)
Total Bilirubin: <0.2 mg/dL (ref 0.0–1.2)
Total Protein: 7.8 g/dL (ref 6.5–8.1)

## 2024-09-03 LAB — URINALYSIS, MICROSCOPIC (REFLEX)

## 2024-09-03 LAB — LIPASE, BLOOD: Lipase: 37 U/L (ref 11–51)

## 2024-09-03 MED ORDER — TAMSULOSIN HCL 0.4 MG PO CAPS: 0.4000 mg | ORAL_CAPSULE | Freq: Every day | ORAL | 0 refills | Status: DC

## 2024-09-03 NOTE — ED Provider Notes (Signed)
 [facility] [facility]  Provider Note  CSN: [id number] Arrival date & time: 09/02/24 2320  History Chief Complaint  Patient presents with   Back Pain    Shannon Ruiz is a 28 y.o. female reports about 2 weeks of intermittent R flank pain, comes and goes. Radiates to RUQ and R groin. Some urinary symptoms, but no blood. 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 No fever or vomiting.    Home Medications Prior to Admission medications   Medication Sig Start Date End Date Taking? Authorizing Provider  tamsulosin  (FLOMAX ) 0.4 MG CAPS capsule Take 1 capsule (0.4 mg total) by mouth daily. 09/03/24  Yes Roselyn Carlin NOVAK, MD  citalopram  (CELEXA ) 20 MG tablet Take 1 tablet (20 mg total) by mouth daily. 08/22/24   Gladis Elsie BROCKS, PA-C     Allergies    Patient has no known allergies.   Review of Systems   Review of Systems Please see HPI for pertinent positives and negatives  Physical Exam BP (!) 132/91 (BP Location: Left Arm)   Pulse 86   Temp 97.8 F (36.6 C)   Resp 16   SpO2 99%   Physical Exam Vitals and nursing note reviewed.  Constitutional:      Appearance: Normal appearance.  HENT:     Head: Normocephalic and atraumatic.     Nose: Nose normal.     Mouth/Throat:     Mouth: Mucous membranes are moist.  Eyes:     Extraocular Movements: Extraocular movements intact.     Conjunctiva/sclera: Conjunctivae normal.  Cardiovascular:     Rate and Rhythm: Normal rate.  Pulmonary:     Effort: Pulmonary effort is normal.     Breath sounds: Normal breath sounds.  Abdominal:     General: Abdomen is flat.     Palpations: Abdomen is soft.     Tenderness: There is abdominal tenderness (mild, RLQ).  Musculoskeletal:        General: Tenderness (R lumbar paraspinal muscles) present. No swelling. Normal range of motion.     Cervical back: Neck supple.  Skin:     General: Skin is warm and dry.  Neurological:     General: No focal deficit present.     Mental Status: She is alert.  Psychiatric:        Mood and Affect: Mood normal.     ED Results / Procedures / Treatments   EKG None  Procedures Procedures  Medications Ordered in the ED Medications - No data to display  Initial Impression and Plan  Patient here with R flank pain, some features of renal colic and/or UTI, but ongoing for 2+ weeks. Labs done in triage show CBC with mild leukocytosis, CMP, Lipase and UA are unremarkable. Will send for CT to eval renal stone. Otherwise she appears comfortable.   ED Course   Clinical Course as of 09/03/24 0232  Tue Sep 03, 2024  0231 I personally viewed the images from radiology studies and agree with radiologist interpretation: CT shows a moderate proximal stone in R ureter. Pain is well controlled at this time. Will d/c with Flomax , she declines Rx for pain meds, recommend OTC NSAIDS if needed. Urine strainer and urology follow up. PCP follow up, RTED for any other concerns.   [CS]    Clinical Course User Index [CS] Roselyn Carlin NOVAK, MD  MDM Rules/Calculators/A&P Medical Decision Making Problems Addressed: Ureteral stone: acute illness or injury  Amount and/or Complexity of Data Reviewed Labs: ordered. Decision-making details documented in ED Course. Radiology: ordered and independent interpretation performed. Decision-making details documented in ED Course.  Risk Prescription drug management.     Final Clinical Impression(s) / ED Diagnoses Final diagnoses:  Ureteral stone    Rx / DC Orders ED Discharge Orders          Ordered    tamsulosin  (FLOMAX ) 0.4 MG CAPS capsule  Daily        09/03/24 0232             Roselyn Carlin NOVAK, MD 09/03/24 [phone]

## 2024-09-03 NOTE — ED Notes (Signed)
 Pt notes due to scheduling she has not had time to be evaluated for her back pain, it is on the right side and moved around the abdomen it has been worsening over some weeks.  She also endorses pain when she pees.

## 2024-09-11 ENCOUNTER — Inpatient Hospital Stay: Admission: RE | Admit: 2024-09-11 | Source: Ambulatory Visit

## 2024-09-11 ENCOUNTER — Ambulatory Visit
Admission: EM | Admit: 2024-09-11 | Discharge: 2024-09-11 | Disposition: A | Discharge status: Home / Self Care | Attending: Family Medicine | Admitting: Family Medicine

## 2024-09-11 DIAGNOSIS — U071 COVID-19: Secondary | ICD-10-CM

## 2024-09-11 DIAGNOSIS — R6889 Other general symptoms and signs: Secondary | ICD-10-CM | POA: Diagnosis not present

## 2024-09-11 LAB — POC SOFIA SARS ANTIGEN FIA: SARS Coronavirus 2 Ag: POSITIVE — AB

## 2024-09-11 NOTE — ED Provider Notes (Signed)
 [facility]   [id number] 09/11/24 Arrival Time: 9070  ASSESSMENT & PLAN:  1. COVID-19 virus infection   2. Not feeling great     Discussed typical duration of COVID.  Results for orders placed or performed during the hospital encounter of 09/11/24  POC SARS Coronavirus 2 Ag   Collection Time: 09/11/24 10:54 AM  Result Value Ref Range   SARS Coronavirus 2 Ag Positive (A) Negative   OTC symptom care as needed.     Follow-up Information     Barbra Lang PARAS, DO.   Specialty: Family Medicine Why: As needed. Contact information: [address] [place] [address] [phone]                 Reviewed expectations re: course of current medical issues. Questions answered. Outlined signs and symptoms indicating need for more acute intervention. Understanding verbalized. After Visit Summary given.   SUBJECTIVE: History from: Patient. Shannon Ruiz is a 28 y.o. female. Patient reports starting yesterday waking up with body aches, pounding ha's, nausea/vomiting (then), No fever known. Tested + for COVID19 around Noon on @ home test. Needs confirmation of + test for work.  OBJECTIVE:  Vitals:   09/11/24 0934 09/11/24 0937  BP:  111/79  Pulse:  (!) 108  Resp:  18  Temp:  99 F (37.2 C)  TempSrc:  Oral  SpO2:  98%  Weight: 93.4 kg   Height: 5' 4 (1.626 m)     General appearance: alert; no distress With nasal congestion Psychological: alert and cooperative; normal mood and affect  Labs: Results for orders placed or performed during the hospital encounter of 09/11/24  POC SARS Coronavirus 2 Ag   Collection Time: 09/11/24 10:54 AM  Result Value Ref Range   SARS Coronavirus 2 Ag Positive (A) Negative   Labs Reviewed  POC SOFIA SARS ANTIGEN FIA - Abnormal; Notable for the following components:      Result Value   SARS Coronavirus 2 Ag Positive (*)    All other components within normal limits    Imaging: No results  found.  No Known Allergies  Past Medical History:  Diagnosis Date   Anemia    Anxiety    Depression    Former smoker 06/30/2020   Quit a couple years ago     Supervision of other high risk pregnancy, antepartum, unspecified trimester 01/09/2021   Needs PP MMR   Social History   Socioeconomic History   Marital status: Married    Spouse name: Wadie Alexa   Number of children: Not on file   Years of education: Not on file   Highest education level: Not on file  Occupational History   Not on file  Tobacco Use   Smoking status: Former    Types: Cigarettes   Smokeless tobacco: Never  Vaping Use   Vaping status: Former  Substance and Sexual Activity   Alcohol use: Never   Drug use: Never   Sexual activity: Yes    Birth control/protection: None  Other Topics Concern   Not on file  Social History Narrative   Not on file   Social Drivers of Health   Financial Resource Strain: Not on file  Food Insecurity: No Food Insecurity (08/03/2023)   Hunger Vital Sign    Worried About Running Out of Food in the Last Year: Never true    Ran Out of Food in the Last Year: Never true  Transportation Needs: No Transportation Needs (08/03/2023)  PRAPARE - Administrator, Civil Service (Medical): No    Lack of Transportation (Non-Medical): No  Physical Activity: Not on file  Stress: Not on file  Social Connections: Not on file  Intimate Partner Violence: Not At Risk (08/03/2023)   Humiliation, Afraid, Rape, and Kick questionnaire    Fear of Current or Ex-Partner: No    Emotionally Abused: No    Physically Abused: No    Sexually Abused: No   Family History  Problem Relation Age of Onset   Anxiety disorder Mother    Hypertension Mother    Heart Problems Mother    Diabetes Father    Past Surgical History:  Procedure Laterality Date   NO PAST SURGERIES       Rolinda Rogue, MD 09/11/24 1058

## 2024-09-11 NOTE — ED Triage Notes (Signed)
 Patient reports starting yesterday waking up with body aches, pounding ha's, nausea/vomiting (then), No fever known. Tested + for COVID19 around [place] on @ home test.

## 2024-09-11 NOTE — Discharge Instructions (Addendum)
 Results for orders placed or performed during the hospital encounter of 09/11/24  POC SARS Coronavirus 2 Ag   Collection Time: 09/11/24 10:54 AM  Result Value Ref Range   SARS Coronavirus 2 Ag Positive (A) Negative

## 2024-09-26 ENCOUNTER — Other Ambulatory Visit: Payer: Self-pay

## 2024-09-26 DIAGNOSIS — F411 Generalized anxiety disorder: Secondary | ICD-10-CM

## 2024-09-26 DIAGNOSIS — F419 Anxiety disorder, unspecified: Secondary | ICD-10-CM

## 2024-09-26 MED ORDER — CITALOPRAM HYDROBROMIDE 20 MG PO TABS: 20.0000 mg | ORAL_TABLET | Freq: Every day | ORAL | 0 refills | Status: DC

## 2024-10-03 ENCOUNTER — Telehealth: Admitting: Family Medicine

## 2024-10-03 DIAGNOSIS — F411 Generalized anxiety disorder: Secondary | ICD-10-CM | POA: Diagnosis not present

## 2024-10-03 MED ORDER — CITALOPRAM HYDROBROMIDE 20 MG PO TABS: 20.0000 mg | ORAL_TABLET | Freq: Every day | ORAL | 3 refills | Status: AC

## 2024-10-03 NOTE — Progress Notes (Signed)
    GYNECOLOGY VIRTUAL VISIT ENCOUNTER NOTE  Provider location: Center for [facility]   Patient location: Home  I connected with Shannon Ruiz on 10/03/24 at 11:15 AM EDT by MyChart Video Encounter and verified that I am speaking with the correct person using two identifiers.   I discussed the limitations, risks, security and privacy concerns of performing an evaluation and management service virtually and the availability of in person appointments. I also discussed with the patient that there may be a patient responsible charge related to this service. The patient expressed understanding and agreed to proceed.   History:  Shannon Ruiz is a 28 y.o. [phone] female being evaluated today for follow-up of Celexa  change.  She had a virtual appointment on 07/2024 and had her dose increased.  Since then, she has noted improvement of her depression and anxiety.  She has noted no side effects. She does have a lot of stress -her family members are having some health problems.concerns.       Past Medical History:  Diagnosis Date   Anemia    Anxiety    Depression    Former smoker 06/30/2020   Quit a couple years ago     Supervision of other high risk pregnancy, antepartum, unspecified trimester 01/09/2021   Needs PP MMR   Past Surgical History:  Procedure Laterality Date   NO PAST SURGERIES     The following portions of the patient's history were reviewed and updated as appropriate: allergies, current medications, past family history, past medical history, past social history, past surgical history and problem list.    Review of Systems:  Pertinent items noted in HPI and remainder of comprehensive ROS otherwise negative.  Physical Exam:   General:  Alert, oriented and cooperative. Patient appears to be in no acute distress.  Mental Status: Normal mood and affect. Normal behavior. Normal judgment and thought content.   Respiratory: Normal respiratory effort, no  problems with respiration noted  Rest of physical exam deferred due to type of encounter  Labs and Imaging No results found for this or any previous visit (from the past 2 weeks). No results found.     Assessment and Plan:     1. GAD (generalized anxiety disorder) Continue with increased dose change - citalopram  (CELEXA ) 20 MG tablet; Take 1 tablet (20 mg total) by mouth daily.  Dispense: 90 tablet; Refill: 3       I discussed the assessment and treatment plan with the patient. The patient was provided an opportunity to ask questions and all were answered. The patient agreed with the plan and demonstrated an understanding of the instructions.   The patient was advised to call back or seek an in-person evaluation/go to the ED if the symptoms worsen or if the condition fails to improve as anticipated.  I provided 10 minutes of face-to-face time during this encounter. I also spent 15 minutes dedicated to the care of this patient including pre-visit review of records, post visit ordering of medications and appropriate tests or procedures, coordinating care and documenting this visit encounter.    York Valliant J Altonio Schwertner, DO Center for [employer], [facility]

## 2024-11-26 ENCOUNTER — Telehealth

## 2025-01-06 ENCOUNTER — Emergency Department (HOSPITAL_BASED_OUTPATIENT_CLINIC_OR_DEPARTMENT_OTHER)

## 2025-01-06 ENCOUNTER — Emergency Department (HOSPITAL_BASED_OUTPATIENT_CLINIC_OR_DEPARTMENT_OTHER)
Admission: EM | Admit: 2025-01-06 | Discharge: 2025-01-06 | Disposition: A | Discharge status: Home / Self Care | Attending: Emergency Medicine | Admitting: Emergency Medicine

## 2025-01-06 ENCOUNTER — Other Ambulatory Visit: Payer: Self-pay

## 2025-01-06 ENCOUNTER — Encounter (HOSPITAL_BASED_OUTPATIENT_CLINIC_OR_DEPARTMENT_OTHER): Payer: Self-pay | Admitting: Emergency Medicine

## 2025-01-06 DIAGNOSIS — N132 Hydronephrosis with renal and ureteral calculous obstruction: Secondary | ICD-10-CM | POA: Insufficient documentation

## 2025-01-06 DIAGNOSIS — R10A1 Flank pain, right side: Secondary | ICD-10-CM | POA: Diagnosis present

## 2025-01-06 DIAGNOSIS — N201 Calculus of ureter: Secondary | ICD-10-CM

## 2025-01-06 LAB — CBC
HCT: 41.3 % (ref 36.0–46.0)
Hemoglobin: 13.6 g/dL (ref 12.0–15.0)
MCH: 28.2 pg (ref 26.0–34.0)
MCHC: 32.9 g/dL (ref 30.0–36.0)
MCV: 85.7 fL (ref 80.0–100.0)
Platelets: 540 K/uL — ABNORMAL HIGH (ref 150–400)
RBC: 4.82 MIL/uL (ref 3.87–5.11)
RDW: 13.3 % (ref 11.5–15.5)
WBC: 24.6 K/uL — ABNORMAL HIGH (ref 4.0–10.5)
nRBC: 0 % (ref 0.0–0.2)

## 2025-01-06 LAB — URINALYSIS, MICROSCOPIC (REFLEX)

## 2025-01-06 LAB — URINALYSIS, ROUTINE W REFLEX MICROSCOPIC
Bilirubin Urine: NEGATIVE
Glucose, UA: NEGATIVE mg/dL
Ketones, ur: NEGATIVE mg/dL
Leukocytes,Ua: NEGATIVE
Nitrite: NEGATIVE
Protein, ur: 100 mg/dL — AB
Specific Gravity, Urine: >=1.03 (ref 1.005–1.030)
pH: 5.5 (ref 5.0–8.0)

## 2025-01-06 LAB — COMPREHENSIVE METABOLIC PANEL WITH GFR
ALT: 14 U/L (ref 0–44)
AST: 14 U/L — ABNORMAL LOW (ref 15–41)
Albumin: 5 g/dL (ref 3.5–5.0)
Alkaline Phosphatase: 81 U/L (ref 38–126)
Anion gap: 13 (ref 5–15)
BUN: 15 mg/dL (ref 6–20)
CO2: 24 mmol/L (ref 22–32)
Calcium: 10 mg/dL (ref 8.9–10.3)
Chloride: 106 mmol/L (ref 98–111)
Creatinine, Ser: 0.7 mg/dL (ref 0.44–1.00)
GFR, Estimated: >60 mL/min
Glucose, Bld: 115 mg/dL — ABNORMAL HIGH (ref 70–99)
Potassium: 4.2 mmol/L (ref 3.5–5.1)
Sodium: 143 mmol/L (ref 135–145)
Total Bilirubin: 0.3 mg/dL (ref 0.0–1.2)
Total Protein: 8.3 g/dL — ABNORMAL HIGH (ref 6.5–8.1)

## 2025-01-06 LAB — PREGNANCY, URINE: Preg Test, Ur: NEGATIVE

## 2025-01-06 MED ORDER — ONDANSETRON HCL 4 MG/2ML IJ SOLN
INTRAMUSCULAR | Status: AC
  Administered 2025-01-06: 4 mg
  Filled 2025-01-06: qty 2

## 2025-01-06 MED ORDER — ACETAMINOPHEN 325 MG PO TABS
650.0000 mg | ORAL_TABLET | Freq: Once | ORAL | Status: AC
  Administered 2025-01-06: 650 mg via ORAL
  Filled 2025-01-06: qty 2

## 2025-01-06 MED ORDER — ONDANSETRON 4 MG PO TBDP: 4.0000 mg | ORAL_TABLET | Freq: Three times a day (TID) | ORAL | 0 refills | Status: AC | PRN

## 2025-01-06 MED ORDER — TAMSULOSIN HCL 0.4 MG PO CAPS
0.4000 mg | ORAL_CAPSULE | Freq: Once | ORAL | Status: AC
  Administered 2025-01-06: 0.4 mg via ORAL
  Filled 2025-01-06: qty 1

## 2025-01-06 MED ORDER — FENTANYL CITRATE (PF) 50 MCG/ML IJ SOSY
50.0000 ug | PREFILLED_SYRINGE | Freq: Once | INTRAMUSCULAR | Status: DC
  Filled 2025-01-06: qty 1

## 2025-01-06 MED ORDER — IOHEXOL 300 MG/ML  SOLN
100.0000 mL | Freq: Once | INTRAMUSCULAR | Status: AC | PRN
  Administered 2025-01-06: 100 mL via INTRAVENOUS

## 2025-01-06 MED ORDER — ONDANSETRON 4 MG PO TBDP: 4.0000 mg | ORAL_TABLET | Freq: Once | ORAL | Status: DC

## 2025-01-06 MED ORDER — SODIUM CHLORIDE 0.9 % IV SOLN: 1.0000 g | Freq: Once | INTRAVENOUS | Status: DC

## 2025-01-06 MED ORDER — ONDANSETRON HCL 4 MG/2ML IJ SOLN
4.0000 mg | Freq: Once | INTRAMUSCULAR | Status: DC
  Filled 2025-01-06: qty 2

## 2025-01-06 NOTE — Discharge Instructions (Addendum)
 Today you were seen for a right sided ureteral stone.  Please pick up your Zofran  for nausea and vomiting.  Please schedule an appointment with Dr. Lovie with urology for further evaluation and workup.  Please return to the ED if you have uncontrollable vomiting, fever, or worsening pain.  Thank you for letting us  treat you today. After reviewing your labs and imaging, I feel you are safe to go home. Please follow up with your PCP in the next several days and provide them with your records from this visit. Return to the Emergency Room if pain becomes severe or symptoms worsen.

## 2025-01-06 NOTE — ED Notes (Signed)
 ED Provider at bedside.

## 2025-01-06 NOTE — ED Triage Notes (Signed)
 Pt c/o R flank pain, pressure with urination, cold sweats, emesis due to pain since this AM.  Also reports possible prolapse since having children.

## 2025-01-06 NOTE — ED Provider Notes (Signed)
 " [facility] Provider Note   CSN: [id number] Arrival date & time: 01/06/25  1444     Patient presents with: Flank Pain   Shannon Ruiz is a 29 y.o. female.  Presents today for right flank pain, urinary urgency, bladder pressure, cold sweats, nausea, and vomiting since this morning.  Patient has a past medical history significant for kidney stones and gallbladder issues.  Patient denies chest pain, shortness of breath, hematuria, dysuria, fever, diarrhea, or any other complaints at this time.    Flank Pain       Prior to Admission medications  Medication Sig Start Date End Date Taking? Authorizing Provider  ondansetron  (ZOFRAN -ODT) 4 MG disintegrating tablet Take 1 tablet (4 mg total) by mouth every 8 (eight) hours as needed. 01/06/25  Yes Serenah Mill N, PA-C  citalopram  (CELEXA ) 20 MG tablet Take 1 tablet (20 mg total) by mouth daily. 10/03/24   Stinson, Jacob J, DO  etonogestrel  (NEXPLANON ) 68 MG IMPL implant 1 each by Subdermal route once.    [provider]    Allergies: Patient has no known allergies.    Review of Systems  Genitourinary:  Positive for flank pain.    Updated Vital Signs BP (!) 123/92   Pulse 79   Temp 97.9 F (36.6 C) (Oral)   Resp 15   Ht 5' 3 (1.6 m)   SpO2 100%   Breastfeeding Yes   BMI 34.01 kg/m   Physical Exam Vitals and nursing note reviewed.  Constitutional:      General: She is not in acute distress.    Appearance: She is well-developed. She is not toxic-appearing.  HENT:     Head: Normocephalic and atraumatic.     Mouth/Throat:     Mouth: Mucous membranes are moist.     Pharynx: Oropharynx is clear.  Eyes:     Conjunctiva/sclera: Conjunctivae normal.  Cardiovascular:     Rate and Rhythm: Normal rate and regular rhythm.     Pulses: Normal pulses.  Pulmonary:     Effort: Pulmonary effort is normal. No respiratory distress.  Abdominal:     General: There is no distension.      Palpations: Abdomen is soft.     Tenderness: There is no abdominal tenderness. There is right CVA tenderness. There is no guarding.  Musculoskeletal:        General: No swelling.     Cervical back: Neck supple.  Skin:    General: Skin is warm and dry.     Capillary Refill: Capillary refill takes less than 2 seconds.  Neurological:     General: No focal deficit present.     Mental Status: She is alert and oriented to person, place, and time.  Psychiatric:        Mood and Affect: Mood normal.     (all labs ordered are listed, but only abnormal results are displayed) Labs Reviewed  URINALYSIS, ROUTINE W REFLEX MICROSCOPIC - Abnormal; Notable for the following components:      Result Value   APPearance CLOUDY (*)    Hgb urine dipstick LARGE (*)    Protein, ur 100 (*)    All other components within normal limits  CBC - Abnormal; Notable for the following components:   WBC 24.6 (*)    Platelets 540 (*)    All other components within normal limits  COMPREHENSIVE METABOLIC PANEL WITH GFR - Abnormal; Notable for the following components:   Glucose, Bld 115 (*)  Total Protein 8.3 (*)    AST 14 (*)    All other components within normal limits  URINALYSIS, MICROSCOPIC (REFLEX) - Abnormal; Notable for the following components:   Bacteria, UA MANY (*)    All other components within normal limits  PREGNANCY, URINE    EKG: None  Radiology: CT ABDOMEN PELVIS W CONTRAST Result Date: 01/06/2025 EXAM: CT ABDOMEN AND PELVIS WITH CONTRAST 01/06/2025 04:27:52 PM TECHNIQUE: CT of the abdomen and pelvis was performed with the administration of 100 mL of iohexol  (OMNIPAQUE ) 300 MG/ML solution. Multiplanar reformatted images are provided for review. Automated exposure control, iterative reconstruction, and/or weight-based adjustment of the mA/kV was utilized to reduce the radiation dose to as low as reasonably achievable. COMPARISON: 09/03/2024 CLINICAL HISTORY: Abdominal/flank pain, stone  suspected. FINDINGS: LOWER CHEST: No acute abnormality. LIVER: The liver is unremarkable. GALLBLADDER AND BILE DUCTS: Gallbladder is unremarkable. No biliary ductal dilatation. SPLEEN: No acute abnormality. PANCREAS: No acute abnormality. ADRENAL GLANDS: No acute abnormality. KIDNEYS, URETERS AND BLADDER: Left kidney: Subcentimeter hypodensity, too small to definitively characterize, but likely a small cyst. Small nonobstructive calyceal calculi. No left hydronephrosis. No stones in the left ureter. Right kidney/ureter: Moderate right sided hydroureteronephrosis due to an obstructive calculus at the right ureteral orifice at the level of the bladder trigone, measuring 4 x 3 x 5 mm. Small nonobstructive calyceal calculi. Bladder: The urinary bladder is completely decompressed. Pertinent negatives: No perinephric or periureteral stranding. GI AND BOWEL: Stomach demonstrates no acute abnormality. Decompressed normal appendix. There is no bowel obstruction. PERITONEUM AND RETROPERITONEUM: No ascites. No free air. No free pelvic fluid. VASCULATURE: Aorta is normal in caliber. LYMPH NODES: No lymphadenopathy. REPRODUCTIVE ORGANS: The uterus and ovaries are within normal limits for patient's age. BONES AND SOFT TISSUES: Scattered thoracolumbar osteophytosis. Lumbar vertebrae at L5. No acute osseous abnormality. No focal soft tissue abnormality. IMPRESSION: 1. Moderate right-sided hydroureteronephrosis due to an obstructive calculus in the right ureteral orifice at the level of the bladder trigone, measuring 4 x 3 x 5 mm. 2. Small nonobstructive bilateral calyceal calculi. Electronically signed by: Rogelia Myers MD MD 01/06/2025 05:50 PM EST RP Workstation: [id number]     Procedures   Medications Ordered in the ED  acetaminophen  (TYLENOL ) tablet 650 mg (has no administration in time range)  ondansetron  (ZOFRAN ) injection 4 mg (has no administration in time range)  ondansetron  (ZOFRAN ) 4 MG/2ML injection (4 mg   Given 01/06/25 1506)  iohexol  (OMNIPAQUE ) 300 MG/ML solution 100 mL (100 mLs Intravenous Contrast Given 01/06/25 1614)  tamsulosin  (FLOMAX ) capsule 0.4 mg (0.4 mg Oral Given 01/06/25 1844)                                    Medical Decision Making Amount and/or Complexity of Data Reviewed Labs: ordered. Radiology: ordered.  Risk Prescription drug management.   This patient presents to the ED for concern of right flank pain differential diagnosis includes UTI, pyelonephritis, kidney stone, appendicitis, choledocholithiasis, acute cholecystitis, viral GI illness   Lab Tests:  I Ordered, and personally interpreted labs.  The pertinent results include: Negative pregnancy, leukocytosis of 24.6 from a baseline of approximately 15, elevated platelets at 540 suspect some hemoconcentration, negative pregnancy, UA with large hemoglobin, 100 protein, elevated specific gravity, many bacteria, 21-50 RBCs, and 21-50 squamous epithelials reduced AST of 14,   Imaging Studies ordered:  I ordered imaging studies including CT abdomen pelvis with contrast I independently  visualized and interpreted imaging which showed moderate right-sided hydroureteronephrosis due to obstructive calculus in the right ureteral orifice at the level of the bladder trigone 5 mm at largest dimension. I agree with the radiologist interpretation   Medicines ordered and prescription drug management:  I ordered medication including Zofran  and Flomax     I have reviewed the patients home medicines and have made adjustments as needed   Problem List / ED Course:  Consulted Urology, Dr. Lovie who recommend analgesics and antiemetics, Flomax , and close follow-up in clinic with good return precautions. Patient tolerating p.o. intake without issue prior to discharge. Considered for admission or further workup however patient's vital signs, physical exam, labs, and imaging are reassuring.  Patient treated for ureterolithiasis and  given symptomatic management with Zofran .  Patient states that she would only like to take over-the-counter Tylenol  for pain control.  Patient to follow-up with urology and given return precautions.  I feel patient is safer discharge at this time.     Final diagnoses:  Ureterolithiasis    ED Discharge Orders          Ordered    ondansetron  (ZOFRAN -ODT) 4 MG disintegrating tablet  Every 8 hours PRN        01/06/25 1854               Francis Ileana SAILOR, PA-C 01/06/25 1857    Jerrol Agent, MD 01/06/25 [phone]  "

## 2025-01-06 NOTE — ED Provider Triage Note (Signed)
 Emergency Medicine Provider Triage Evaluation Note  Shannon Ruiz , a 29 y.o. female  was evaluated in triage.  Pt complains of R flank pain, Hx stones and gallbladder issues. States feel different from previous kidney stones.   Review of Systems  Positive: R flank pain, urinary urgency, N/V, cold sweats Negative: Hematuria, fever, CP, SHOB, epigastric pain  Physical Exam  BP (!) 123/92   Pulse 79   Temp 97.9 F (36.6 C) (Oral)   Resp 15   Ht 5' 3 (1.6 m)   SpO2 100%   Breastfeeding Yes   BMI 34.01 kg/m  Gen:   Awake, no distress   Resp:  Normal effort  MSK:   Moves extremities without difficulty  Other:    Medical Decision Making  Medically screening exam initiated at 3:02 PM.  Appropriate orders placed.  Shannon Ruiz was informed that the remainder of the evaluation will be completed by another provider, this initial triage assessment does not replace that evaluation, and the importance of remaining in the ED until their evaluation is complete.  Labs and imaging ordered   Shannon Ruiz 01/06/25 1504
